# Patient Record
Sex: Male | Born: 1945
Health system: Southern US, Community
[De-identification: ages and names within clinical notes are randomized; demographics above are authoritative.]

## PROBLEM LIST (undated history)

## (undated) DIAGNOSIS — T7840XA Allergy, unspecified, initial encounter: Secondary | ICD-10-CM

## (undated) DIAGNOSIS — E785 Hyperlipidemia, unspecified: Secondary | ICD-10-CM

## (undated) DIAGNOSIS — K219 Gastro-esophageal reflux disease without esophagitis: Secondary | ICD-10-CM

## (undated) DIAGNOSIS — I10 Essential (primary) hypertension: Secondary | ICD-10-CM

## (undated) DIAGNOSIS — E119 Type 2 diabetes mellitus without complications: Secondary | ICD-10-CM

## (undated) HISTORY — PX: COLONOSCOPY: SHX174

## (undated) HISTORY — DX: Hyperlipidemia, unspecified: E78.5

## (undated) HISTORY — DX: Allergy, unspecified, initial encounter: T78.40XA

## (undated) HISTORY — DX: Type 2 diabetes mellitus without complications: E11.9

## (undated) HISTORY — PX: OTHER SURGICAL HISTORY: SHX169

---

## 2013-03-01 ENCOUNTER — Other Ambulatory Visit: Payer: Self-pay | Admitting: Physician Assistant

## 2013-05-02 ENCOUNTER — Other Ambulatory Visit: Payer: Self-pay | Admitting: Physician Assistant

## 2013-06-04 ENCOUNTER — Other Ambulatory Visit: Payer: Self-pay | Admitting: Physician Assistant

## 2013-06-05 NOTE — Telephone Encounter (Signed)
Last glucose level 02/2013   ACM

## 2013-07-06 ENCOUNTER — Other Ambulatory Visit: Payer: Self-pay

## 2013-07-06 MED ORDER — GLUCOSE BLOOD VI STRP: ORAL_STRIP | Status: DC

## 2013-07-06 NOTE — Telephone Encounter (Signed)
Last glucose 02/08/13   ACM 

## 2013-07-31 ENCOUNTER — Other Ambulatory Visit: Payer: Self-pay | Admitting: Family Medicine

## 2013-08-01 NOTE — Telephone Encounter (Signed)
Last glucose 02/08/13   ACM

## 2013-09-04 ENCOUNTER — Other Ambulatory Visit: Payer: Self-pay | Admitting: Family Medicine

## 2013-10-03 ENCOUNTER — Other Ambulatory Visit: Payer: Self-pay | Admitting: Family Medicine

## 2013-10-09 ENCOUNTER — Other Ambulatory Visit: Payer: Self-pay | Admitting: *Deleted

## 2013-10-09 ENCOUNTER — Other Ambulatory Visit: Payer: Self-pay

## 2013-10-09 MED ORDER — GLUCOSE BLOOD VI STRP: ORAL_STRIP | Status: DC

## 2013-10-09 MED ORDER — FENOFIBRATE 145 MG PO TABS: 145.0000 mg | ORAL_TABLET | Freq: Every day | ORAL | Status: DC

## 2013-10-24 ENCOUNTER — Encounter: Payer: Self-pay | Admitting: Family Medicine

## 2013-10-24 ENCOUNTER — Ambulatory Visit (INDEPENDENT_AMBULATORY_CARE_PROVIDER_SITE_OTHER): Payer: Medicare Other | Admitting: Family Medicine

## 2013-10-24 VITALS — BP 131/81 | HR 77 | Temp 99.5°F | Ht 68.0 in | Wt 195.0 lb

## 2013-10-24 DIAGNOSIS — I10 Essential (primary) hypertension: Secondary | ICD-10-CM

## 2013-10-24 DIAGNOSIS — E785 Hyperlipidemia, unspecified: Secondary | ICD-10-CM

## 2013-10-24 DIAGNOSIS — E119 Type 2 diabetes mellitus without complications: Secondary | ICD-10-CM | POA: Insufficient documentation

## 2013-10-24 LAB — POCT GLYCOSYLATED HEMOGLOBIN (HGB A1C): Hemoglobin A1C: 6.5

## 2013-10-24 LAB — POCT UA - MICROALBUMIN: Microalbumin Ur, POC: NEGATIVE mg/L

## 2013-10-24 MED ORDER — FENOFIBRATE 145 MG PO TABS: 145.0000 mg | ORAL_TABLET | Freq: Every day | ORAL | Status: DC

## 2013-10-24 MED ORDER — NIACIN ER (ANTIHYPERLIPIDEMIC) 1000 MG PO TBCR: 1000.0000 mg | EXTENDED_RELEASE_TABLET | Freq: Every day | ORAL | Status: DC

## 2013-10-24 MED ORDER — LISINOPRIL 5 MG PO TABS: 5.0000 mg | ORAL_TABLET | Freq: Every day | ORAL | Status: DC

## 2013-10-24 MED ORDER — CHOLECALCIFEROL 25 MCG (1000 UT) PO CAPS: 1000.0000 [IU] | ORAL_CAPSULE | Freq: Every day | ORAL | Status: DC

## 2013-10-24 MED ORDER — GLUCOSE BLOOD VI STRP: ORAL_STRIP | Status: DC

## 2013-10-24 MED ORDER — ATORVASTATIN CALCIUM 20 MG PO TABS: 20.0000 mg | ORAL_TABLET | Freq: Every day | ORAL | Status: DC

## 2013-10-24 NOTE — Progress Notes (Signed)
Patient name: Billy Nolan Medical record number: [id number] Date of birth: 1946-06-07 Age: 67 y.o. Gender: male  Primary Care Provider: Rudi Heap, MD  Chief Complaint: Chronic medical problem follow   History of Present Illness:  Diabetic ROS - medication compliance: compliant most of the time, home glucose monitoring: is performed regularly blood sugars in 130s-150s at home , acute symptoms are none.  New concerns: none .   Diabetic exam: fundi discs sharp, no papilledema, heart sounds normal rate, regular rhythm, normal S1, S2, no murmurs, rubs, clicks or gallops, chest clear, no carotid bruits, feet: warm, good capillary refill.  Lab review: no lab studies available for review at time of visit.  Assessment: Diabetes Mellitus: well controlled and stable.  Plan: See orders for this visit as documented in the electronic medical record. Diabetic issues reviewed with him: Continue current regimen.    Hypertension ROS: taking medications as instructed, no medication side effects noted, no TIA's, no chest pain on exertion, no dyspnea on exertion and no swelling of ankles.  New concerns: none .  Hypertension Exam: BP noted to be well controlled today in office, S1, S2 normal, no gallop, no murmur, chest clear, no JVD, no HSM, no edema.  Lab review: no lab studies available for review at time of visit.  Assessment:  Hypertension well controlled.  Plan: Current treatment plan is effective, no change in therapy.  Cardiovascular risk analysis - 67 y.o. male diabetic hypertension hyperlipidemia.  ROS: taking medications as instructed, no medication side effects noted, no TIA's, no chest pain on exertion, no dyspnea on exertion and no swelling of ankles.  New concerns: none .  Exam: BP noted to be well controlled today in office, S1, S2 normal, no gallop, no murmur, chest clear, no JVD, no HSM, no edema. Lab review: no lab studies available for review at time of visit.  Assessment:   Hyperlipidemia pending lipoprofile .  Plan: pending lipoprofile . Has been on statin and fibrate for extended period of time. Will che   Past Medical History: Patient Active Problem List   Diagnosis Date Noted  . DM (diabetes mellitus) 10/24/2013  . Hyperlipidemia 10/24/2013   Past Medical History  Diagnosis Date  . Diabetes mellitus without complication   . Hyperlipidemia     Past Surgical History: History reviewed. No pertinent past surgical history.  Social History: History   Social History  . Marital Status: Married    Spouse Name: N/A    Number of Children: N/A  . Years of Education: N/A   Social History Main Topics  . Smoking status: Never Smoker   . Smokeless tobacco: Never Used  . Alcohol Use: No  . Drug Use: No  . Sexual Activity: None   Other Topics Concern  . None   Social History Narrative  . None    Family History: Family History  Problem Relation Age of Onset  . Heart disease Mother   . Stroke Father   . Multiple sclerosis Sister     Allergies: Allergies  Allergen Reactions  . Sulfa Antibiotics Rash    Current Outpatient Prescriptions  Medication Sig Dispense Refill  . atorvastatin (LIPITOR) 20 MG tablet Take 20 mg by mouth daily.       . CELEBREX 200 MG capsule Take 200 mg by mouth daily.       . Cholecalciferol 1000 UNITS capsule Take 1,000 Units by mouth daily.      . fenofibrate (TRICOR) 145 MG tablet Take 1 tablet (145  mg total) by mouth daily.  15 tablet  0  . Glucosamine-Chondroit-Vit C-Mn (GLUCOSAMINE 1500 COMPLEX PO) Take 1 capsule by mouth.      Marland Kitchen glucose blood (FREESTYLE LITE) test strip USE TO CHECK GLUCOSE THREE TIMES DAILY Dx 250.00  100 each  0  . Magnesium 400 MG CAPS Take 1 capsule by mouth.      . metFORMIN (GLUCOPHAGE) 500 MG tablet Take 500 mg by mouth daily with breakfast.       . niacin (NIASPAN) 1000 MG CR tablet Take 1,000 mg by mouth at bedtime.        No current facility-administered medications for this  visit.   Review Of Systems: 12 point ROS negative except as noted above in HPI.  Physical Exam: Filed Vitals:   10/24/13 0900  BP: 131/81  Pulse: 77  Temp: 99.5 F (37.5 C)    General: alert and cooperative HEENT: PERRLA and extra ocular movement intact Heart: S1, S2 normal, no murmur, rub or gallop, regular rate and rhythm Lungs: clear to auscultation, no wheezes or rales and unlabored breathing Abdomen: abdomen is soft without significant tenderness, masses, organomegaly or guarding Extremities: extremities normal, atraumatic, no cyanosis or edema Skin:no rashes Neurology: normal without focal findings  Labs and Imaging: Pending   Assessment and Plan: DM (diabetes mellitus) - Plan: POCT glycosylated hemoglobin (Hb A1C), POCT UA - Microalbumin, glucose blood (FREESTYLE LITE) test strip, lisinopril (PRINIVIL,ZESTRIL) 5 MG tablet  Hyperlipidemia - Plan: NMR, lipoprofile, atorvastatin (LIPITOR) 20 MG tablet, fenofibrate (TRICOR) 145 MG tablet, niacin (NIASPAN) 1000 MG CR tablet  HTN (hypertension) - Plan: Comprehensive metabolic panel, lisinopril (PRINIVIL,ZESTRIL) 5 MG tablet   Sugars overall controlled at home, though may benefit from full dose metformin. Check A1C today.  Will need diabetic eye exam and ophtho exam.  Recheck lipoprofile today. Has been on statin-fibrate combo for extended period of time with no report of myopathy/muscle pain. Check CK to coorelate. Higher risk given age. Also Check Cr.  Will add on low dose lisinopril given baseline diabetic disease. Goal BP < 130/80 in setting of DM.  Continue ASA  Otherwise plan for follow up in 3-6 months.        Doree Albee MD

## 2013-10-24 NOTE — Patient Instructions (Addendum)
Return in 1 week for labwork (kidney function tests) Review handouts and consider Pneumonia and Influenza vaccine.  Type 2 Diabetes Mellitus, Adult Type 2 diabetes mellitus, often simply referred to as type 2 diabetes, is a long-lasting (chronic) disease. In type 2 diabetes, the pancreas does not make enough insulin (a hormone), the cells are less responsive to the insulin that is made (insulin resistance), or both. Normally, insulin moves sugars from food into the tissue cells. The tissue cells use the sugars for energy. The lack of insulin or the lack of normal response to insulin causes excess sugars to build up in the blood instead of going into the tissue cells. As a result, high blood sugar (hyperglycemia) develops. The effect of high sugar (glucose) levels can cause many complications. Type 2 diabetes was also previously called adult-onset diabetes but it can occur at any age.  RISK FACTORS  A person is predisposed to developing type 2 diabetes if someone in the family has the disease and also has one or more of the following primary risk factors:  Overweight.  An inactive lifestyle.  A history of consistently eating high-calorie foods. Maintaining a normal weight and regular physical activity can reduce the chance of developing type 2 diabetes. SYMPTOMS  A person with type 2 diabetes may not show symptoms initially. The symptoms of type 2 diabetes appear slowly. The symptoms include:  Increased thirst (polydipsia).  Increased urination (polyuria).  Increased urination during the night (nocturia).  Weight loss. This weight loss may be rapid.  Frequent, recurring infections.  Tiredness (fatigue).  Weakness.  Vision changes, such as blurred vision.  Fruity smell to your breath.  Abdominal pain.  Nausea or vomiting.  Cuts or bruises which are slow to heal.  Tingling or numbness in the hands or feet. DIAGNOSIS Type 2 diabetes is frequently not diagnosed until  complications of diabetes are present. Type 2 diabetes is diagnosed when symptoms or complications are present and when blood glucose levels are increased. Your blood glucose level may be checked by one or more of the following blood tests:  A fasting blood glucose test. You will not be allowed to eat for at least 8 hours before a blood sample is taken.  A random blood glucose test. Your blood glucose is checked at any time of the day regardless of when you ate.  A hemoglobin A1c blood glucose test. A hemoglobin A1c test provides information about blood glucose control over the previous 3 months.  An oral glucose tolerance test (OGTT). Your blood glucose is measured after you have not eaten (fasted) for 2 hours and then after you drink a glucose-containing beverage. TREATMENT   You may need to take insulin or diabetes medicine daily to keep blood glucose levels in the desired range.  You will need to match insulin dosing with exercise and healthy food choices. The treatment goal is to maintain the before meal blood sugar (preprandial glucose) level at 70 130 mg/dL. HOME CARE INSTRUCTIONS   Have your hemoglobin A1c level checked twice a year.  Perform daily blood glucose monitoring as directed by your caregiver.  Monitor urine ketones when you are ill and as directed by your caregiver.  Take your diabetes medicine or insulin as directed by your caregiver to maintain your blood glucose levels in the desired range.  Never run out of diabetes medicine or insulin. It is needed every day.  Adjust insulin based on your intake of carbohydrates. Carbohydrates can raise blood glucose levels but need  to be included in your diet. Carbohydrates provide vitamins, minerals, and fiber which are an essential part of a healthy diet. Carbohydrates are found in fruits, vegetables, whole grains, dairy products, legumes, and foods containing added sugars.    Eat healthy foods. Alternate 3 meals with 3  snacks.  Lose weight if overweight.  Carry a medical alert card or wear your medical alert jewelry.  Carry a 15 gram carbohydrate snack with you at all times to treat low blood glucose (hypoglycemia). Some examples of 15 gram carbohydrate snacks include:  Glucose tablets, 3 or 4   Glucose gel, 15 gram tube  Raisins, 2 tablespoons (24 grams)  Jelly beans, 6  Animal crackers, 8  Regular pop, 4 ounces (120 mL)  Gummy treats, 9  Recognize hypoglycemia. Hypoglycemia occurs with blood glucose levels of 70 mg/dL and below. The risk for hypoglycemia increases when fasting or skipping meals, during or after intense exercise, and during sleep. Hypoglycemia symptoms can include:  Tremors or shakes.  Decreased ability to concentrate.  Sweating.  Increased heart rate.  Headache.  Dry mouth.  Hunger.  Irritability.  Anxiety.  Restless sleep.  Altered speech or coordination.  Confusion.  Treat hypoglycemia promptly. If you are alert and able to safely swallow, follow the 15:15 rule:  Take 15 20 grams of rapid-acting glucose or carbohydrate. Rapid-acting options include glucose gel, glucose tablets, or 4 ounces (120 mL) of fruit juice, regular soda, or low fat milk.  Check your blood glucose level 15 minutes after taking the glucose.  Take 15 20 grams more of glucose if the repeat blood glucose level is still 70 mg/dL or below.  Eat a meal or snack within 1 hour once blood glucose levels return to normal.    Be alert to polyuria and polydipsia which are early signs of hyperglycemia. An early awareness of hyperglycemia allows for prompt treatment. Treat hyperglycemia as directed by your caregiver.  Engage in at least 150 minutes of moderate-intensity physical activity a week, spread over at least 3 days of the week or as directed by your caregiver. In addition, you should engage in resistance exercise at least 2 times a week or as directed by your caregiver.  Adjust  your medicine and food intake as needed if you start a new exercise or sport.  Follow your sick day plan at any time you are unable to eat or drink as usual.  Avoid tobacco use.  Limit alcohol intake to no more than 1 drink per day for nonpregnant women and 2 drinks per day for men. You should drink alcohol only when you are also eating food. Talk with your caregiver whether alcohol is safe for you. Tell your caregiver if you drink alcohol several times a week.  Follow up with your caregiver regularly.  Schedule an eye exam soon after the diagnosis of type 2 diabetes and then annually.  Perform daily skin and foot care. Examine your skin and feet daily for cuts, bruises, redness, nail problems, bleeding, blisters, or sores. A foot exam by a caregiver should be done annually.  Brush your teeth and gums at least twice a day and floss at least once a day. Follow up with your dentist regularly.  Share your diabetes management plan with your workplace or school.  Stay up-to-date with immunizations.  Learn to manage stress.  Obtain ongoing diabetes education and support as needed.  Participate in, or seek rehabilitation as needed to maintain or improve independence and quality of life. Request  a physical or occupational therapy referral if you are having foot or hand numbness or difficulties with grooming, dressing, eating, or physical activity. SEEK MEDICAL CARE IF:   You are unable to eat food or drink fluids for more than 6 hours.  You have nausea and vomiting for more than 6 hours.  Your blood glucose level is over 240 mg/dL.  There is a change in mental status.  You develop an additional serious illness.  You have diarrhea for more than 6 hours.  You have been sick or have had a fever for a couple of days and are not getting better.  You have pain during any physical activity.  SEEK IMMEDIATE MEDICAL CARE IF:  You have difficulty breathing.  You have moderate to large  ketone levels. MAKE SURE YOU:  Understand these instructions.  Will watch your condition.  Will get help right away if you are not doing well or get worse. Document Released: 11/22/2005 Document Revised: 08/16/2012 Document Reviewed: 06/20/2012 [facility] Patient Information 2014 [place], [place].

## 2013-10-26 LAB — COMPREHENSIVE METABOLIC PANEL
ALT: 23 IU/L (ref 0–44)
AST: 18 IU/L (ref 0–40)
BUN: 11 mg/dL (ref 8–27)
CO2: 23 mmol/L (ref 18–29)
Calcium: 9.8 mg/dL (ref 8.6–10.2)
Chloride: 103 mmol/L (ref 97–108)
Creatinine, Ser: 0.95 mg/dL (ref 0.76–1.27)
Globulin, Total: 1.9 g/dL (ref 1.5–4.5)
Glucose: 133 mg/dL — ABNORMAL HIGH (ref 65–99)
Potassium: 4.9 mmol/L (ref 3.5–5.2)
Sodium: 141 mmol/L (ref 134–144)
Total Protein: 6.5 g/dL (ref 6.0–8.5)

## 2013-10-26 LAB — CK TOTAL AND CKMB (NOT AT ARMC)
CK-MB Index: 2.4 ng/mL (ref 0.0–5.0)
Total CK: 87 U/L (ref 24–204)

## 2013-10-26 LAB — NMR, LIPOPROFILE
HDL Particle Number: 32.5 umol/L (ref >=30.5)
LDL Size: 20 nm — ABNORMAL LOW (ref >20.5)
Small LDL Particle Number: 1194 nmol/L — ABNORMAL HIGH (ref <=527)
Triglycerides by NMR: 125 mg/dL (ref <150)

## 2013-11-14 ENCOUNTER — Other Ambulatory Visit: Payer: Self-pay | Admitting: Family Medicine

## 2013-11-14 DIAGNOSIS — E785 Hyperlipidemia, unspecified: Secondary | ICD-10-CM

## 2013-11-14 MED ORDER — ROSUVASTATIN CALCIUM 40 MG PO TABS: 20.0000 mg | ORAL_TABLET | Freq: Every day | ORAL | Status: DC

## 2013-11-19 ENCOUNTER — Telehealth: Payer: Self-pay | Admitting: *Deleted

## 2013-11-19 NOTE — Telephone Encounter (Signed)
Released to My Chart. Medication was ordered in Epic.

## 2013-11-19 NOTE — Telephone Encounter (Signed)
Message copied by Gwenith Daily on Mon Nov 19, 2013  3:07 PM ------      Message from: Floydene Flock      Created: Wed Nov 14, 2013  9:14 AM       Transition pt to crestor 20 to help with HLD       Will stop lipitor. ------

## 2013-11-22 ENCOUNTER — Other Ambulatory Visit: Payer: Self-pay | Admitting: Family Medicine

## 2013-11-22 DIAGNOSIS — E119 Type 2 diabetes mellitus without complications: Secondary | ICD-10-CM

## 2013-11-24 ENCOUNTER — Encounter: Payer: Self-pay | Admitting: Family Medicine

## 2013-11-26 ENCOUNTER — Other Ambulatory Visit: Payer: Self-pay

## 2013-11-26 ENCOUNTER — Other Ambulatory Visit: Payer: Self-pay | Admitting: Nurse Practitioner

## 2013-11-26 DIAGNOSIS — E785 Hyperlipidemia, unspecified: Secondary | ICD-10-CM

## 2013-11-26 MED ORDER — FENOFIBRATE 145 MG PO TABS: 145.0000 mg | ORAL_TABLET | Freq: Every day | ORAL | Status: DC

## 2013-11-27 MED ORDER — GLUCOSE BLOOD VI STRP: ORAL_STRIP | Status: DC

## 2013-12-02 ENCOUNTER — Encounter: Payer: Self-pay | Admitting: Family Medicine

## 2013-12-03 ENCOUNTER — Other Ambulatory Visit: Payer: Self-pay | Admitting: Family Medicine

## 2013-12-03 MED ORDER — CELECOXIB 200 MG PO CAPS: 200.0000 mg | ORAL_CAPSULE | Freq: Every day | ORAL | Status: DC

## 2013-12-03 MED ORDER — METFORMIN HCL 500 MG PO TABS: 500.0000 mg | ORAL_TABLET | Freq: Every day | ORAL | Status: DC

## 2014-03-04 ENCOUNTER — Other Ambulatory Visit: Payer: Self-pay | Admitting: *Deleted

## 2014-03-04 MED ORDER — CELECOXIB 200 MG PO CAPS: 200.0000 mg | ORAL_CAPSULE | Freq: Every day | ORAL | Status: DC

## 2014-03-04 NOTE — Telephone Encounter (Signed)
Patient only seen once on 10-24-13 by [facility]. Please advise

## 2014-05-22 ENCOUNTER — Other Ambulatory Visit: Payer: Self-pay | Admitting: Family Medicine

## 2014-05-29 ENCOUNTER — Other Ambulatory Visit: Payer: Self-pay | Admitting: Family Medicine

## 2014-05-30 NOTE — Telephone Encounter (Signed)
This is okay to refill x1. Patient should have a BMP and LFTs and CBC if on this long term--please let patient know this

## 2014-05-30 NOTE — Telephone Encounter (Signed)
Only seen in epic 1 time in 11-14. Patient of [place]. Please advise on refill

## 2014-06-20 ENCOUNTER — Other Ambulatory Visit: Payer: Self-pay | Admitting: Family Medicine

## 2014-06-25 ENCOUNTER — Other Ambulatory Visit: Payer: Self-pay | Admitting: Family Medicine

## 2014-07-02 ENCOUNTER — Other Ambulatory Visit: Payer: Self-pay | Admitting: *Deleted

## 2014-07-02 MED ORDER — CELECOXIB 200 MG PO CAPS: ORAL_CAPSULE | ORAL | Status: DC

## 2014-07-02 NOTE — Telephone Encounter (Signed)
Is okay to refill. Patient must have a CBC LFTs and BMP because of taking this medication for such a long period of time without being seen

## 2014-07-03 ENCOUNTER — Other Ambulatory Visit: Payer: Self-pay | Admitting: *Deleted

## 2014-07-03 ENCOUNTER — Ambulatory Visit (INDEPENDENT_AMBULATORY_CARE_PROVIDER_SITE_OTHER): Payer: Medicare Other | Admitting: Family Medicine

## 2014-07-03 ENCOUNTER — Encounter: Payer: Self-pay | Admitting: Family Medicine

## 2014-07-03 VITALS — BP 151/84 | HR 90 | Temp 98.5°F | Ht 68.0 in | Wt 193.0 lb

## 2014-07-03 DIAGNOSIS — E785 Hyperlipidemia, unspecified: Secondary | ICD-10-CM

## 2014-07-03 DIAGNOSIS — Z23 Encounter for immunization: Secondary | ICD-10-CM

## 2014-07-03 DIAGNOSIS — IMO0001 Reserved for inherently not codable concepts without codable children: Secondary | ICD-10-CM

## 2014-07-03 DIAGNOSIS — E1165 Type 2 diabetes mellitus with hyperglycemia: Secondary | ICD-10-CM

## 2014-07-03 DIAGNOSIS — Z1211 Encounter for screening for malignant neoplasm of colon: Secondary | ICD-10-CM

## 2014-07-03 LAB — POCT UA - MICROALBUMIN: Microalbumin Ur, POC: 50 mg/L

## 2014-07-03 LAB — POCT GLYCOSYLATED HEMOGLOBIN (HGB A1C): Hemoglobin A1C: 7.4

## 2014-07-03 MED ORDER — FENOFIBRATE 145 MG PO TABS: 145.0000 mg | ORAL_TABLET | Freq: Every day | ORAL | Status: DC

## 2014-07-03 MED ORDER — CELECOXIB 200 MG PO CAPS: ORAL_CAPSULE | ORAL | Status: DC

## 2014-07-03 MED ORDER — LISINOPRIL 5 MG PO TABS: 5.0000 mg | ORAL_TABLET | Freq: Every day | ORAL | Status: DC

## 2014-07-03 MED ORDER — GLUCOSE BLOOD VI STRP: 1.0000 | ORAL_STRIP | Freq: Two times a day (BID) | Status: DC

## 2014-07-03 MED ORDER — NIACIN ER (ANTIHYPERLIPIDEMIC) 1000 MG PO TBCR: 1000.0000 mg | EXTENDED_RELEASE_TABLET | Freq: Every day | ORAL | Status: DC

## 2014-07-03 NOTE — Addendum Note (Signed)
Addended by: Lyn Henri C on: 07/03/2014 12:05 PM   Modules accepted: Orders

## 2014-07-03 NOTE — Patient Instructions (Signed)

## 2014-07-03 NOTE — Progress Notes (Signed)
   Subjective:    Patient ID: Billy Nolan, male    DOB: Feb 23, 1946, 68 y.o.   MRN: [id number]  HPI 68 year old gentleman here to followup diabetes, hyperlipidemia. He checks his sugars some home generally been around 1:30 to 150 range fasting. He does complain of some backache for which he takes Celebrex. We discussed possible complications with the use of Cox 2 inhibitors and Celebrex but he has not had any problems and there's no history of heart disease so we will continue.    Review of Systems  Constitutional: Negative.   HENT: Negative.   Eyes: Negative.   Respiratory: Negative.   Cardiovascular: Negative.   Endocrine: Negative.   Genitourinary: Negative.   Musculoskeletal: Negative.        Objective:   Physical Exam  Constitutional: He is oriented to person, place, and time. He appears well-developed and well-nourished.  HENT:  Head: Normocephalic.  Right Ear: External ear normal.  Left Ear: External ear normal.  Nose: Nose normal.  Mouth/Throat: Oropharynx is clear and moist.  Eyes: Conjunctivae and EOM are normal. Pupils are equal, round, and reactive to light.  Neck: Normal range of motion. Neck supple.  Cardiovascular: Normal rate, regular rhythm, normal heart sounds and intact distal pulses.   Pulmonary/Chest: Effort normal and breath sounds normal.  Abdominal: Soft. Bowel sounds are normal.  Musculoskeletal: Normal range of motion.  Neurological: He is alert and oriented to person, place, and time.  Skin: Skin is warm and dry.  Psychiatric: He has a normal mood and affect. His behavior is normal. Judgment and thought content normal.          Assessment & Plan:  The primary encounter diagnosis was Encounter for screening colonoscopy. Diagnoses of Hyperlipidemia and Type II or unspecified type diabetes mellitus without mention of complication, uncontrolled were also pertinent to this visit. Will check labs:A1C, lipids, LFT's  Wardell Honour MD

## 2014-07-04 LAB — CMP14+EGFR
ALK PHOS: 49 IU/L (ref 39–117)
ALT: 25 IU/L (ref 0–44)
AST: 17 IU/L (ref 0–40)
Albumin/Globulin Ratio: 2.4 (ref 1.1–2.5)
Albumin: 4.7 g/dL (ref 3.6–4.8)
BUN / CREAT RATIO: 20 (ref 10–22)
BUN: 20 mg/dL (ref 8–27)
CHLORIDE: 104 mmol/L (ref 97–108)
CO2: 24 mmol/L (ref 18–29)
Calcium: 10.1 mg/dL (ref 8.6–10.2)
Creatinine, Ser: 0.99 mg/dL (ref 0.76–1.27)
GFR calc Af Amer: 91 mL/min/{1.73_m2} (ref >59)
GFR calc non Af Amer: 79 mL/min/{1.73_m2} (ref >59)
GLOBULIN, TOTAL: 2 g/dL (ref 1.5–4.5)
Glucose: 143 mg/dL — ABNORMAL HIGH (ref 65–99)
Potassium: 5.1 mmol/L (ref 3.5–5.2)
Sodium: 142 mmol/L (ref 134–144)
TOTAL PROTEIN: 6.7 g/dL (ref 6.0–8.5)
Total Bilirubin: 0.9 mg/dL (ref 0.0–1.2)

## 2014-07-04 LAB — LIPID PANEL
Chol/HDL Ratio: 4 ratio units (ref 0.0–5.0)
Cholesterol, Total: 135 mg/dL (ref 100–199)
HDL: 34 mg/dL — ABNORMAL LOW (ref >39)
LDL CALC: 66 mg/dL (ref 0–99)
Triglycerides: 175 mg/dL — ABNORMAL HIGH (ref 0–149)
VLDL CHOLESTEROL CAL: 35 mg/dL (ref 5–40)

## 2014-07-04 LAB — MICROALBUMIN, URINE

## 2014-07-17 ENCOUNTER — Encounter: Payer: Self-pay | Admitting: Internal Medicine

## 2014-07-24 ENCOUNTER — Other Ambulatory Visit: Payer: Self-pay | Admitting: *Deleted

## 2014-07-24 MED ORDER — LISINOPRIL 5 MG PO TABS: 5.0000 mg | ORAL_TABLET | Freq: Every day | ORAL | Status: DC

## 2014-08-28 ENCOUNTER — Ambulatory Visit (AMBULATORY_SURGERY_CENTER): Payer: Medicare Other

## 2014-08-28 VITALS — Ht 68.0 in | Wt 195.2 lb

## 2014-08-28 DIAGNOSIS — Z1211 Encounter for screening for malignant neoplasm of colon: Secondary | ICD-10-CM

## 2014-08-28 MED ORDER — MOVIPREP 100 G PO SOLR: 1.0000 | Freq: Once | ORAL | Status: DC

## 2014-08-28 NOTE — Progress Notes (Signed)
No allergies to eggs or soy No past exposure to anesthesia except IV conscious sedation for colonoscopy No home oxygen No diet/weight loss meds  Has email  Emmi instructions given for colonoscopy

## 2014-09-09 ENCOUNTER — Encounter: Payer: Self-pay | Admitting: Internal Medicine

## 2014-09-12 ENCOUNTER — Encounter: Payer: Self-pay | Admitting: Internal Medicine

## 2014-09-12 ENCOUNTER — Ambulatory Visit (AMBULATORY_SURGERY_CENTER): Payer: Medicare Other | Admitting: Internal Medicine

## 2014-09-12 VITALS — BP 124/78 | HR 67 | Temp 97.2°F | Resp 16 | Ht 68.0 in | Wt 195.0 lb

## 2014-09-12 DIAGNOSIS — D128 Benign neoplasm of rectum: Secondary | ICD-10-CM

## 2014-09-12 DIAGNOSIS — D123 Benign neoplasm of transverse colon: Secondary | ICD-10-CM

## 2014-09-12 DIAGNOSIS — Z1211 Encounter for screening for malignant neoplasm of colon: Secondary | ICD-10-CM

## 2014-09-12 DIAGNOSIS — K635 Polyp of colon: Secondary | ICD-10-CM

## 2014-09-12 MED ORDER — SODIUM CHLORIDE 0.9 % IV SOLN: 500.0000 mL | INTRAVENOUS | Status: DC

## 2014-09-12 NOTE — Op Note (Signed)
[facility]  [address] [place], [id number]   COLONOSCOPY PROCEDURE REPORT  PATIENT: Billy Nolan, Billy Nolan  MR#: [id number] BIRTHDATE: 04-09-1946 , 68  yrs. old GENDER: male ENDOSCOPIST: Eustace Quail, MD REFERRED EL:[id number] Laurance Flatten, M.D. PROCEDURE DATE:  09/12/2014 PROCEDURE:   Colonoscopy with snare polypectomy x 1 First Screening Colonoscopy - Avg.  risk and is 50 yrs.  old or older Yes.  Prior Negative Screening - Now for repeat screening. N/A  History of Adenoma - Now for follow-up colonoscopy & has been > or = to 3 yrs.  N/A  Polyps Removed Today? Yes. ASA CLASS:   Class II INDICATIONS:average risk for colorectal cancer. MEDICATIONS: Monitored anesthesia care and Propofol 200 mg IV  DESCRIPTION OF PROCEDURE:   After the risks benefits and alternatives of the procedure were thoroughly explained, informed consent was obtained.  The digital rectal exam revealed no abnormalities of the rectum.   The [id number]  endoscope was introduced through the anus and advanced to the cecum, which was identified by both the appendix and ileocecal valve. No adverse events experienced.   The quality of the prep was excellent, using MoviPrep  The instrument was then slowly withdrawn as the colon was fully examined.    COLON FINDINGS: A single polyp measuring 3 mm in size was found in the transverse colon.  A polypectomy was performed with a cold snare.  The resection was complete, the polyp tissue was completely retrieved and sent to histology.   There was moderate diverticulosis noted in the left colon.   The examination was otherwise normal.  Retroflexed views revealed internal hemorrhoids. The time to cecum=2 minutes 56 seconds.  Withdrawal time=10 minutes 24 seconds.  The scope was withdrawn and the procedure completed.  COMPLICATIONS: There were no immediate complications.  ENDOSCOPIC IMPRESSION: 1.   Single polyp measuring 3 mm in size was found in the  transverse colon; polypectomy was performed with a cold snare 2.   Moderate diverticulosis was noted in the left colon 3.   The examination was otherwise normal  RECOMMENDATIONS: 1. Repeat colonoscopy in 5 years if polyp adenomatous; otherwise 10 years  eSigned:  Eustace Quail, MD 09/12/2014 12:31 PM   cc: Redge Gainer, MD and The Patient

## 2014-09-12 NOTE — Progress Notes (Signed)
A/ox3 pleased with MAC, report to Sheila RN 

## 2014-09-12 NOTE — Patient Instructions (Signed)
YOU HAD AN ENDOSCOPIC PROCEDURE TODAY AT THE [facility]: Refer to the procedure report that was given to you for any specific questions about what was found during the examination.  If the procedure report does not answer your questions, please call your gastroenterologist to clarify.  If you requested that your care partner not be given the details of your procedure findings, then the procedure report has been included in a sealed envelope for you to review at your convenience later.  YOU SHOULD EXPECT: Some feelings of bloating in the abdomen. Passage of more gas than usual.  Walking can help get rid of the air that was put into your GI tract during the procedure and reduce the bloating. If you had a lower endoscopy (such as a colonoscopy or flexible sigmoidoscopy) you may notice spotting of blood in your stool or on the toilet paper. If you underwent a bowel prep for your procedure, then you may not have a normal bowel movement for a few days.  DIET: Your first meal following the procedure should be a light meal and then it is ok to progress to your normal diet.  A half-sandwich or bowl of soup is an example of a good first meal.  Heavy or fried foods are harder to digest and may make you feel nauseous or bloated.  Likewise meals heavy in dairy and vegetables can cause extra gas to form and this can also increase the bloating.  Drink plenty of fluids but you should avoid alcoholic beverages for 24 hours.  ACTIVITY: Your care partner should take you home directly after the procedure.  You should plan to take it easy, moving slowly for the rest of the day.  You can resume normal activity the day after the procedure however you should NOT DRIVE or use heavy machinery for 24 hours (because of the sedation medicines used during the test).    SYMPTOMS TO REPORT IMMEDIATELY: A gastroenterologist can be reached at any hour.  During normal business hours, 8:30 AM to 5:00 PM Monday through Friday,  call [phone].  After hours and on weekends, please call the GI answering service at [phone] who will take a message and have the physician on call contact you.   Following lower endoscopy (colonoscopy or flexible sigmoidoscopy):  Excessive amounts of blood in the stool  Significant tenderness or worsening of abdominal pains  Swelling of the abdomen that is new, acute  Fever of 100F or higher   FOLLOW UP: If any biopsies were taken you will be contacted by phone or by letter within the next 1-3 weeks.  Call your gastroenterologist if you have not heard about the biopsies in 3 weeks.  Our staff will call the home number listed on your records the next business day following your procedure to check on you and address any questions or concerns that you may have at that time regarding the information given to you following your procedure. This is a courtesy call and so if there is no answer at the home number and we have not heard from you through the emergency physician on call, we will assume that you have returned to your regular daily activities without incident.  SIGNATURES/CONFIDENTIALITY: You and/or your care partner have signed paperwork which will be entered into your electronic medical record.  These signatures attest to the fact that that the information above on your After Visit Summary has been reviewed and is understood.  Full responsibility of the confidentiality of   this discharge information lies with you and/or your care-partner.   Resume medications. Information given on polyps,diverticulosis and high fiber diet with discharge instructions. 

## 2014-09-13 ENCOUNTER — Telehealth: Payer: Self-pay

## 2014-09-13 NOTE — Telephone Encounter (Signed)
  Follow up Call-  Call back number 09/12/2014  Post procedure Call Back phone  # [phone]  Permission to leave phone message Yes     Patient questions:  Do you have a fever, pain , or abdominal swelling? No. Pain Score  0 *  Have you tolerated food without any problems? Yes.    Have you been able to return to your normal activities? Yes.    Do you have any questions about your discharge instructions: Diet   No. Medications  No. Follow up visit  No.  Do you have questions or concerns about your Care? No.  Actions: * If pain score is 4 or above: No action needed, pain <4.

## 2014-09-18 ENCOUNTER — Encounter: Payer: Self-pay | Admitting: Internal Medicine

## 2014-10-04 ENCOUNTER — Ambulatory Visit (INDEPENDENT_AMBULATORY_CARE_PROVIDER_SITE_OTHER): Payer: Medicare Other | Admitting: Family Medicine

## 2014-10-04 ENCOUNTER — Encounter: Payer: Self-pay | Admitting: Family Medicine

## 2014-10-04 VITALS — BP 132/82 | HR 90 | Temp 98.0°F | Ht 68.0 in | Wt 193.0 lb

## 2014-10-04 DIAGNOSIS — E119 Type 2 diabetes mellitus without complications: Secondary | ICD-10-CM

## 2014-10-04 DIAGNOSIS — N400 Enlarged prostate without lower urinary tract symptoms: Secondary | ICD-10-CM

## 2014-10-04 LAB — POCT GLYCOSYLATED HEMOGLOBIN (HGB A1C): Hemoglobin A1C: 7.8

## 2014-10-04 MED ORDER — SILDENAFIL CITRATE 20 MG PO TABS: ORAL_TABLET | ORAL | Status: DC

## 2014-10-04 NOTE — Progress Notes (Signed)
   Subjective:    Patient ID: Billy Nolan, male    DOB: 09-04-1946, 68 y.o.   MRN: [id number]  HPI 68 year old gentleman here for follow-up diabetes and hyperlipidemia. He is only taking 500 of metformin once a day. He admits to some elevated glucose readings at home and his last A1c 3 months ago was 7.4, not quite at goal. He also asked about a prostate exam, having not had one in recent memory. There is no family history. He also requests prescription for erectile dysfunction    Review of Systems  Constitutional: Negative.   HENT: Negative.   Eyes: Negative.   Respiratory: Negative.  Negative for shortness of breath.   Cardiovascular: Negative.  Negative for chest pain and leg swelling.  Gastrointestinal: Negative.   Genitourinary: Negative.   Musculoskeletal: Negative.   Skin: Negative.   Neurological: Negative.   Psychiatric/Behavioral: Negative.   All other systems reviewed and are negative.      Objective:   Physical Exam  Constitutional: He is oriented to person, place, and time. He appears well-developed and well-nourished.  HENT:  Head: Normocephalic.  Right Ear: External ear normal.  Left Ear: External ear normal.  Nose: Nose normal.  Mouth/Throat: Oropharynx is clear and moist.  Eyes: Conjunctivae and EOM are normal. Pupils are equal, round, and reactive to light.  Neck: Normal range of motion. Neck supple.  Cardiovascular: Normal rate, regular rhythm, normal heart sounds and intact distal pulses.   Pulmonary/Chest: Effort normal and breath sounds normal.  Abdominal: Soft. Bowel sounds are normal.  Genitourinary: Prostate normal.  Musculoskeletal: Normal range of motion.  Neurological: He is alert and oriented to person, place, and time.  Skin: Skin is warm and dry.  Psychiatric: He has a normal mood and affect. His behavior is normal. Judgment and thought content normal.     BP 132/82  Pulse 90  Temp(Src) 98 F (36.7 C) (Oral)  Ht 5\' 8"  (1.727 m)  Wt  193 lb (87.544 kg)  BMI 29.35 kg/m2     Assessment & Plan:  1. Type 2 diabetes mellitus without complication Increase metformin to 500 mg twice a day - POCT glycosylated hemoglobin (Hb A1C)  2. BPH (benign prostatic hyperplasia) Normal prostate exam  Wardell Honour MD - PSA, total and free

## 2014-10-05 LAB — PSA, TOTAL AND FREE
PSA, Free Pct: 40 %
PSA, Free: 0.16 ng/mL
PSA: 0.4 ng/mL (ref 0.0–4.0)

## 2014-10-24 ENCOUNTER — Telehealth: Payer: Self-pay | Admitting: Family Medicine

## 2014-10-24 NOTE — Telephone Encounter (Signed)
Spoke to Butch Penny pt's wife, she states at last appt he was told to take Metformin twice a day so they need a new rx sent in, couldn't find any documentation regarding a change in the Metformin, please advise.

## 2014-10-25 MED ORDER — LISINOPRIL 5 MG PO TABS: 5.0000 mg | ORAL_TABLET | Freq: Every day | ORAL | Status: DC

## 2014-10-25 MED ORDER — METFORMIN HCL 500 MG PO TABS: 500.0000 mg | ORAL_TABLET | Freq: Every day | ORAL | Status: DC

## 2014-12-26 ENCOUNTER — Telehealth: Payer: Self-pay | Admitting: Family Medicine

## 2014-12-28 ENCOUNTER — Other Ambulatory Visit: Payer: Self-pay | Admitting: *Deleted

## 2014-12-28 DIAGNOSIS — E785 Hyperlipidemia, unspecified: Secondary | ICD-10-CM

## 2014-12-28 MED ORDER — CELECOXIB 200 MG PO CAPS: ORAL_CAPSULE | ORAL | Status: DC

## 2014-12-28 MED ORDER — ROSUVASTATIN CALCIUM 40 MG PO TABS: 20.0000 mg | ORAL_TABLET | Freq: Every day | ORAL | Status: DC

## 2014-12-28 NOTE — Telephone Encounter (Signed)
I do not see a request.

## 2015-03-28 ENCOUNTER — Other Ambulatory Visit: Payer: Self-pay

## 2015-03-28 NOTE — Telephone Encounter (Signed)
Last seen 10/04/14 Dr Sabra Heck if approved route to nurse to call into [facility]

## 2015-03-31 MED ORDER — SILDENAFIL CITRATE 20 MG PO TABS: ORAL_TABLET | ORAL | Status: DC

## 2015-03-31 NOTE — Telephone Encounter (Signed)
Refill left on voicemail at [facility]

## 2015-05-24 ENCOUNTER — Other Ambulatory Visit: Payer: Self-pay | Admitting: Family Medicine

## 2015-05-26 NOTE — Telephone Encounter (Signed)
Last seen 10/04/14 Dr Sabra Heck  If approved route to nurse to call into [facility]

## 2015-05-26 NOTE — Telephone Encounter (Signed)
Refill called to [facility] 

## 2015-06-23 ENCOUNTER — Other Ambulatory Visit: Payer: Self-pay | Admitting: Family Medicine

## 2015-07-16 ENCOUNTER — Other Ambulatory Visit: Payer: Self-pay | Admitting: Family Medicine

## 2015-07-20 ENCOUNTER — Other Ambulatory Visit: Payer: Self-pay | Admitting: Family Medicine

## 2015-07-21 NOTE — Telephone Encounter (Signed)
Patient last seen in office on 10-04-14. Please advise on refill

## 2015-09-25 ENCOUNTER — Other Ambulatory Visit: Payer: Self-pay | Admitting: Family Medicine

## 2015-09-25 NOTE — Telephone Encounter (Signed)
LM that he will need appt before refills after this #30 can be renewed

## 2015-09-25 NOTE — Telephone Encounter (Signed)
Last seen by Sabra Heck 09/2014

## 2015-10-21 ENCOUNTER — Encounter: Payer: Self-pay | Admitting: Family Medicine

## 2015-10-21 ENCOUNTER — Ambulatory Visit (INDEPENDENT_AMBULATORY_CARE_PROVIDER_SITE_OTHER): Payer: Medicare Other | Admitting: Family Medicine

## 2015-10-21 VITALS — BP 118/78 | HR 94 | Temp 97.1°F | Ht 68.0 in | Wt 200.7 lb

## 2015-10-21 DIAGNOSIS — E119 Type 2 diabetes mellitus without complications: Secondary | ICD-10-CM

## 2015-10-21 DIAGNOSIS — N400 Enlarged prostate without lower urinary tract symptoms: Secondary | ICD-10-CM | POA: Diagnosis not present

## 2015-10-21 DIAGNOSIS — I159 Secondary hypertension, unspecified: Secondary | ICD-10-CM

## 2015-10-21 DIAGNOSIS — E785 Hyperlipidemia, unspecified: Secondary | ICD-10-CM | POA: Diagnosis not present

## 2015-10-21 LAB — POCT GLYCOSYLATED HEMOGLOBIN (HGB A1C): HEMOGLOBIN A1C: 9.1

## 2015-10-21 MED ORDER — FENOFIBRATE 145 MG PO TABS: 145.0000 mg | ORAL_TABLET | Freq: Every day | ORAL | Status: DC

## 2015-10-21 MED ORDER — NIACIN ER (ANTIHYPERLIPIDEMIC) 1000 MG PO TBCR: 1000.0000 mg | EXTENDED_RELEASE_TABLET | Freq: Every day | ORAL | Status: DC

## 2015-10-21 MED ORDER — ROSUVASTATIN CALCIUM 40 MG PO TABS: 20.0000 mg | ORAL_TABLET | Freq: Every day | ORAL | Status: DC

## 2015-10-21 MED ORDER — SILDENAFIL CITRATE 20 MG PO TABS: ORAL_TABLET | ORAL | Status: DC

## 2015-10-21 MED ORDER — METFORMIN HCL 500 MG PO TABS: 500.0000 mg | ORAL_TABLET | Freq: Two times a day (BID) | ORAL | Status: DC

## 2015-10-21 MED ORDER — LOSARTAN POTASSIUM 25 MG PO TABS: 25.0000 mg | ORAL_TABLET | Freq: Every day | ORAL | Status: DC

## 2015-10-21 NOTE — Addendum Note (Signed)
Addended by: Wardell Heath on: 10/21/2015 11:10 AM   Modules accepted: Orders

## 2015-10-21 NOTE — Progress Notes (Signed)
   Subjective:    Patient ID: Billy Nolan, male    DOB: October 04, 1946, 69 y.o.   MRN: [id number]  HPI  Patient here today for 6 month follow up on hypertension, hypelipidemia and diabetes. Still taking metformin once a day. Also having some dry cough with lisinopril. Need to update all labs since it has been a year since last checked.    Review of Systems  Constitutional: Negative.   HENT: Negative.   Eyes: Negative.   Respiratory: Negative.   Cardiovascular: Negative.   Gastrointestinal: Negative.   Endocrine: Negative.   Genitourinary: Negative.   Musculoskeletal: Negative.   Skin: Negative.   Allergic/Immunologic: Negative.   Neurological: Negative.   Hematological: Negative.   Psychiatric/Behavioral: Negative.    Patient Active Problem List   Diagnosis Date Noted  . DM (diabetes mellitus) ([place]) 10/24/2013  . Hyperlipidemia 10/24/2013   Outpatient Encounter Prescriptions as of 10/21/2015  Medication Sig  . aspirin 81 MG tablet Take 81 mg by mouth daily.  . Cholecalciferol 1000 UNITS capsule Take 1 capsule (1,000 Units total) by mouth daily.  . fenofibrate (TRICOR) 145 MG tablet TAKE ONE TABLET BY MOUTH ONCE DAILY  . Glucosamine-Chondroit-Vit C-Mn (GLUCOSAMINE 1500 COMPLEX PO) Take 1 capsule by mouth.  Marland Kitchen glucose blood (FREESTYLE LITE) test strip 1 each by Other route 2 (two) times daily.  Marland Kitchen lisinopril (PRINIVIL,ZESTRIL) 5 MG tablet TAKE ONE TABLET BY MOUTH ONCE DAILY  . Magnesium 400 MG CAPS Take 1 capsule by mouth.  . metFORMIN (GLUCOPHAGE) 500 MG tablet Take 1 tablet (500 mg total) by mouth daily with breakfast.  . niacin (NIASPAN) 1000 MG CR tablet TAKE ONE TABLET BY MOUTH ONCE DAILY AT BEDTIME  . rosuvastatin (CRESTOR) 40 MG tablet Take 0.5 tablets (20 mg total) by mouth daily.  . sildenafil (REVATIO) 20 MG tablet TAKE ONE TO TWO TABLETS BY MOUTH AS NEEDED  . celecoxib (CELEBREX) 200 MG capsule TAKE ONE CAPSULE BY MOUTH ONCE DAILY (Patient not taking: Reported on  10/21/2015)   No facility-administered encounter medications on file as of 10/21/2015.        Objective:   Physical Exam  Constitutional: He is oriented to person, place, and time. He appears well-developed and well-nourished.  Cardiovascular: Normal rate, regular rhythm and normal heart sounds.   Pulmonary/Chest: Effort normal and breath sounds normal.  Neurological: He is alert and oriented to person, place, and time.  Psychiatric: He has a normal mood and affect.   BP 118/78 mmHg  Pulse 94  Temp(Src) 97.1 F (36.2 C) (Oral)  Ht _0  (1.727 m)  Wt 200 lb 11.2 oz (91.037 kg)  BMI 30.52 kg/m2        Assessment & Plan:  1. Type 2 diabetes mellitus without complication, unspecified long term insulin use status (HCC) Gross metformin to 500 mg twice a day - POCT glycosylated hemoglobin (Hb A1C)  2. BPH (benign prostatic hyperplasia) Symptoms are stable  3. Hyperlipidemia Tolerates medicines. Need to reassess effect of statin - Lipid panel - CMP14+EGFR  4. Secondary hypertension, unspecified Change lisinopril to losartan 25 mg once a day. This should also help cough - CMP14+EGFR  Wardell Honour MD

## 2015-10-22 LAB — CMP14+EGFR
A/G RATIO: 2.1 (ref 1.1–2.5)
ALBUMIN: 4.7 g/dL (ref 3.6–4.8)
ALK PHOS: 58 IU/L (ref 39–117)
ALT: 24 IU/L (ref 0–44)
AST: 19 IU/L (ref 0–40)
BILIRUBIN TOTAL: 1 mg/dL (ref 0.0–1.2)
BUN / CREAT RATIO: 15 (ref 10–22)
BUN: 15 mg/dL (ref 8–27)
CHLORIDE: 100 mmol/L (ref 97–106)
CO2: 23 mmol/L (ref 18–29)
CREATININE: 0.98 mg/dL (ref 0.76–1.27)
Calcium: 10 mg/dL (ref 8.6–10.2)
GFR calc Af Amer: 92 mL/min/{1.73_m2} (ref >59)
GFR calc non Af Amer: 79 mL/min/{1.73_m2} (ref >59)
GLOBULIN, TOTAL: 2.2 g/dL (ref 1.5–4.5)
Glucose: 201 mg/dL — ABNORMAL HIGH (ref 65–99)
POTASSIUM: 4.9 mmol/L (ref 3.5–5.2)
SODIUM: 139 mmol/L (ref 136–144)
Total Protein: 6.9 g/dL (ref 6.0–8.5)

## 2015-10-22 LAB — LIPID PANEL
CHOLESTEROL TOTAL: 160 mg/dL (ref 100–199)
Chol/HDL Ratio: 4.7 ratio units (ref 0.0–5.0)
HDL: 34 mg/dL — AB (ref >39)
LDL Calculated: 77 mg/dL (ref 0–99)
TRIGLYCERIDES: 246 mg/dL — AB (ref 0–149)
VLDL CHOLESTEROL CAL: 49 mg/dL — AB (ref 5–40)

## 2015-10-22 NOTE — Progress Notes (Signed)
Patient informed via detailed message 

## 2015-11-25 ENCOUNTER — Encounter: Payer: Self-pay | Admitting: *Deleted

## 2015-11-25 LAB — HM DIABETES EYE EXAM

## 2015-12-29 DIAGNOSIS — M542 Cervicalgia: Secondary | ICD-10-CM | POA: Diagnosis not present

## 2016-01-05 DIAGNOSIS — M5442 Lumbago with sciatica, left side: Secondary | ICD-10-CM | POA: Diagnosis not present

## 2016-01-15 ENCOUNTER — Other Ambulatory Visit: Payer: Self-pay | Admitting: Family Medicine

## 2016-01-15 DIAGNOSIS — M5442 Lumbago with sciatica, left side: Secondary | ICD-10-CM | POA: Diagnosis not present

## 2016-01-22 ENCOUNTER — Other Ambulatory Visit: Payer: Self-pay | Admitting: Surgical

## 2016-01-22 DIAGNOSIS — M5442 Lumbago with sciatica, left side: Secondary | ICD-10-CM | POA: Diagnosis not present

## 2016-01-23 NOTE — Patient Instructions (Addendum)
Billy Nolan  01/23/2016   Your procedure is scheduled on: 01-30-16  Report to [facility]  Entrance take [facility]  elevators to 3rd floor to  [place] at 530 AM.  Call this number if you have problems the morning of surgery [phone]   Remember: ONLY 1 PERSON MAY GO WITH YOU TO SHORT STAY TO GET  READY MORNING OF [place].   Do not eat food or drink liquids :After Midnight.     Take these medicines the morning of surgery with A SIP OF WATER: percocet if needed              DO NOT TAKE ANY DIABETIC MEDICATIONS DAY OF YOUR SURGERY!                               You may not have any metal on your body including hair pins and              piercings  Do not wear jewelry, make-up, lotions, powders or perfumes, deodorant             Do not wear nail polish.  Do not shave  48 hours prior to surgery.              Men may shave face and neck.   Do not bring valuables to the hospital. [place] [facility] [place].  Contacts, dentures or bridgework may not be worn into surgery.  Leave suitcase in the car. After surgery it may be brought to your room.                   Please read over the following fact sheets you were given: _____________________________________________________________________             [facility] - Preparing for Surgery Before surgery, you can play an important role.  Because skin is not sterile, your skin needs to be as free of germs as possible.  You can reduce the number of germs on your skin by washing with CHG (chlorahexidine gluconate) soap before surgery.  CHG is an antiseptic cleaner which kills germs and bonds with the skin to continue killing germs even after washing. Please DO NOT use if you have an allergy to CHG or antibacterial soaps.  If your skin becomes reddened/irritated stop using the CHG and inform your nurse when you arrive at Short Stay. Do not shave (including legs and  underarms) for at least 48 hours prior to the first CHG shower.  You may shave your face/neck. Please follow these instructions carefully:  1.  Shower with CHG Soap the night before surgery and the  morning of Surgery.  2.  If you choose to wash your hair, wash your hair first as usual with your  normal  shampoo.  3.  After you shampoo, rinse your hair and body thoroughly to remove the  shampoo.                           4.  Use CHG as you would any other liquid soap.  You can apply chg directly  to the skin and wash  Gently with a scrungie or clean washcloth.  5.  Apply the CHG Soap to your body ONLY FROM THE NECK DOWN.   Do not use on face/ open                           Wound or open sores. Avoid contact with eyes, ears mouth and genitals (private parts).                       Wash face,  Genitals (private parts) with your normal soap.             6.  Wash thoroughly, paying special attention to the area where your surgery  will be performed.  7.  Thoroughly rinse your body with warm water from the neck down.  8.  DO NOT shower/wash with your normal soap after using and rinsing off  the CHG Soap.                9.  Pat yourself dry with a clean towel.            10.  Wear clean pajamas.            11.  Place clean sheets on your bed the night of your first shower and do not  sleep with pets. Day of Surgery : Do not apply any lotions/deodorants the morning of surgery.  Please wear clean clothes to the hospital/surgery center.  FAILURE TO FOLLOW THESE INSTRUCTIONS MAY RESULT IN THE CANCELLATION OF YOUR SURGERY PATIENT SIGNATURE_________________________________  NURSE SIGNATURE__________________________________  ________________________________________________________________________   [name]  An incentive spirometer is a tool that can help keep your lungs clear and active. This tool measures how well you are filling your lungs with each breath. Taking  long deep breaths may help reverse or decrease the chance of developing breathing (pulmonary) problems (especially infection) following:  A long period of time when you are unable to move or be active. BEFORE THE PROCEDURE   If the spirometer includes an indicator to show your best effort, your nurse or respiratory therapist will set it to a desired goal.  If possible, sit up straight or lean slightly forward. Try not to slouch.  Hold the incentive spirometer in an upright position. INSTRUCTIONS FOR USE   Sit on the edge of your bed if possible, or sit up as far as you can in bed or on a chair.  Hold the incentive spirometer in an upright position.  Breathe out normally.  Place the mouthpiece in your mouth and seal your lips tightly around it.  Breathe in slowly and as deeply as possible, raising the piston or the ball toward the top of the column.  Hold your breath for 3-5 seconds or for as long as possible. Allow the piston or ball to fall to the bottom of the column.  Remove the mouthpiece from your mouth and breathe out normally.  Rest for a few seconds and repeat Steps 1 through 7 at least 10 times every 1-2 hours when you are awake. Take your time and take a few normal breaths between deep breaths.  The spirometer may include an indicator to show your best effort. Use the indicator as a goal to work toward during each repetition.  After each set of 10 deep breaths, practice coughing to be sure your lungs are clear. If you have an incision (the cut made at the time of surgery),  support your incision when coughing by placing a pillow or rolled up towels firmly against it. Once you are able to get out of bed, walk around indoors and cough well. You may stop using the incentive spirometer when instructed by your caregiver.  RISKS AND COMPLICATIONS  Take your time so you do not get dizzy or light-headed.  If you are in pain, you may need to take or ask for pain medication before  doing incentive spirometry. It is harder to take a deep breath if you are having pain. AFTER USE  Rest and breathe slowly and easily.  It can be helpful to keep track of a log of your progress. Your caregiver can provide you with a simple table to help with this. If you are using the spirometer at home, follow these instructions: [place] IF:   You are having difficultly using the spirometer.  You have trouble using the spirometer as often as instructed.  Your pain medication is not giving enough relief while using the spirometer.  You develop fever of 100.5 F (38.1 C) or higher. SEEK IMMEDIATE MEDICAL CARE IF:   You cough up bloody sputum that had not been present before.  You develop fever of 102 F (38.9 C) or greater.  You develop worsening pain at or near the incision site. MAKE SURE YOU:   Understand these instructions.  Will watch your condition.  Will get help right away if you are not doing well or get worse. Document Released: 04/04/2007 Document Revised: 02/14/2012 Document Reviewed: 06/05/2007 ExitCare Patient Information 2014 Exit[employer], [place].   ________________________________________________________________________  WHAT IS A BLOOD TRANSFUSION? Blood Transfusion Information  A transfusion is the replacement of blood or some of its parts. Blood is made up of multiple cells which provide different functions.  Red blood cells carry oxygen and are used for blood loss replacement.  White blood cells fight against infection.  Platelets control bleeding.  Plasma helps clot blood.  Other blood products are available for specialized needs, such as hemophilia or other clotting disorders. BEFORE THE TRANSFUSION  Who gives blood for transfusions?   Healthy volunteers who are fully evaluated to make sure their blood is safe. This is blood bank blood. Transfusion therapy is the safest it has ever been in the practice of medicine. Before blood is taken  from a donor, a complete history is taken to make sure that person has no history of diseases nor engages in risky social behavior (examples are intravenous drug use or sexual activity with multiple partners). The donor's travel history is screened to minimize risk of transmitting infections, such as malaria. The donated blood is tested for signs of infectious diseases, such as HIV and hepatitis. The blood is then tested to be sure it is compatible with you in order to minimize the chance of a transfusion reaction. If you or a relative donates blood, this is often done in anticipation of surgery and is not appropriate for emergency situations. It takes many days to process the donated blood. RISKS AND COMPLICATIONS Although transfusion therapy is very safe and saves many lives, the main dangers of transfusion include:   Getting an infectious disease.  Developing a transfusion reaction. This is an allergic reaction to something in the blood you were given. Every precaution is taken to prevent this. The decision to have a blood transfusion has been considered carefully by your caregiver before blood is given. Blood is not given unless the benefits outweigh the risks. AFTER THE TRANSFUSION  Right after receiving a blood transfusion, you will usually feel much better and more energetic. This is especially true if your red blood cells have gotten low (anemic). The transfusion raises the level of the red blood cells which carry oxygen, and this usually causes an energy increase.  The nurse administering the transfusion will monitor you carefully for complications. HOME CARE INSTRUCTIONS  No special instructions are needed after a transfusion. You may find your energy is better. Speak with your caregiver about any limitations on activity for underlying diseases you may have. SEEK MEDICAL CARE IF:   Your condition is not improving after your transfusion.  You develop redness or irritation at the  intravenous (IV) site. SEEK IMMEDIATE MEDICAL CARE IF:  Any of the following symptoms occur over the next 12 hours:  Shaking chills.  You have a temperature by mouth above 102 F (38.9 C), not controlled by medicine.  Chest, back, or muscle pain.  People around you feel you are not acting correctly or are confused.  Shortness of breath or difficulty breathing.  Dizziness and fainting.  You get a rash or develop hives.  You have a decrease in urine output.  Your urine turns a dark color or changes to pink, red, or brown. Any of the following symptoms occur over the next 10 days:  You have a temperature by mouth above 102 F (38.9 C), not controlled by medicine.  Shortness of breath.  Weakness after normal activity.  The white part of the eye turns yellow (jaundice).  You have a decrease in the amount of urine or are urinating less often.  Your urine turns a dark color or changes to pink, red, or brown. Document Released: 11/19/2000 Document Revised: 02/14/2012 Document Reviewed: 07/08/2008 [facility] Patient Information 2014 [place], [place].  _______________________________________________________________________

## 2016-01-27 ENCOUNTER — Encounter (HOSPITAL_COMMUNITY): Payer: Self-pay

## 2016-01-27 ENCOUNTER — Ambulatory Visit (HOSPITAL_COMMUNITY)
Admission: RE | Admit: 2016-01-27 | Discharge: 2016-01-27 | Disposition: A | Discharge status: Home / Self Care | Payer: PPO | Source: Ambulatory Visit | Attending: Surgical | Admitting: Surgical

## 2016-01-27 ENCOUNTER — Encounter (HOSPITAL_COMMUNITY)
Admission: RE | Admit: 2016-01-27 | Discharge: 2016-01-27 | Disposition: A | Discharge status: Home / Self Care | Payer: PPO | Source: Ambulatory Visit | Attending: Orthopedic Surgery | Admitting: Orthopedic Surgery

## 2016-01-27 ENCOUNTER — Ambulatory Visit (HOSPITAL_COMMUNITY): Admission: RE | Admit: 2016-01-27 | Payer: PPO | Source: Ambulatory Visit

## 2016-01-27 DIAGNOSIS — Z01818 Encounter for other preprocedural examination: Secondary | ICD-10-CM | POA: Insufficient documentation

## 2016-01-27 DIAGNOSIS — Z01812 Encounter for preprocedural laboratory examination: Secondary | ICD-10-CM | POA: Diagnosis not present

## 2016-01-27 HISTORY — DX: Gastro-esophageal reflux disease without esophagitis: K21.9

## 2016-01-27 HISTORY — DX: Essential (primary) hypertension: I10

## 2016-01-27 LAB — COMPREHENSIVE METABOLIC PANEL
ALT: 32 U/L (ref 17–63)
AST: 26 U/L (ref 15–41)
Albumin: 4.6 g/dL (ref 3.5–5.0)
Alkaline Phosphatase: 44 U/L (ref 38–126)
Anion gap: 9 (ref 5–15)
BUN: 13 mg/dL (ref 6–20)
CO2: 27 mmol/L (ref 22–32)
Calcium: 10.3 mg/dL (ref 8.9–10.3)
Chloride: 102 mmol/L (ref 101–111)
Creatinine, Ser: 0.89 mg/dL (ref 0.61–1.24)
GFR calc Af Amer: >60 mL/min (ref >60)
GFR calc non Af Amer: >60 mL/min (ref >60)
Glucose, Bld: 176 mg/dL — ABNORMAL HIGH (ref 65–99)
Potassium: 4.6 mmol/L (ref 3.5–5.1)
Sodium: 138 mmol/L (ref 135–145)
Total Bilirubin: 0.7 mg/dL (ref 0.3–1.2)
Total Protein: 7.7 g/dL (ref 6.5–8.1)

## 2016-01-27 LAB — CBC WITH DIFFERENTIAL/PLATELET
Basophils Absolute: 0 10*3/uL (ref 0.0–0.1)
Basophils Relative: 0 %
Eosinophils Absolute: 0.2 10*3/uL (ref 0.0–0.7)
Eosinophils Relative: 3 %
HCT: 48.3 % (ref 39.0–52.0)
Hemoglobin: 15.4 g/dL (ref 13.0–17.0)
Lymphocytes Relative: 35 %
Lymphs Abs: 1.9 10*3/uL (ref 0.7–4.0)
MCH: 30.1 pg (ref 26.0–34.0)
MCHC: 31.9 g/dL (ref 30.0–36.0)
MCV: 94.3 fL (ref 78.0–100.0)
Monocytes Absolute: 0.4 10*3/uL (ref 0.1–1.0)
Monocytes Relative: 7 %
Neutro Abs: 3 10*3/uL (ref 1.7–7.7)
Neutrophils Relative %: 55 %
Platelets: 299 10*3/uL (ref 150–400)
RBC: 5.12 MIL/uL (ref 4.22–5.81)
RDW: 12.5 % (ref 11.5–15.5)
WBC: 5.4 10*3/uL (ref 4.0–10.5)

## 2016-01-27 LAB — PROTIME-INR
INR: 1.11 (ref 0.00–1.49)
Prothrombin Time: 14.1 seconds (ref 11.6–15.2)

## 2016-01-27 LAB — SURGICAL PCR SCREEN
MRSA, PCR: NEGATIVE
Staphylococcus aureus: NEGATIVE

## 2016-01-27 LAB — APTT: aPTT: 28 seconds (ref 24–37)

## 2016-01-27 LAB — ABO/RH: ABO/RH(D): AB POS

## 2016-01-27 NOTE — Progress Notes (Signed)
Consulted with Dr. Finis Bud, Anesthesia about EKG results and patient having a high Pulse rate of 130 when at pre-operative appointment for surgery. BP -151/89. Per Dr. Finis Bud, patient needs to have pre-operative clearance from Primary Care Physician for his surgery on Friday 01/30/2016.

## 2016-01-28 LAB — HEMOGLOBIN A1C
Hgb A1c MFr Bld: 9.7 % — ABNORMAL HIGH (ref 4.8–5.6)
MEAN PLASMA GLUCOSE: 232 mg/dL

## 2016-01-28 NOTE — Progress Notes (Signed)
01-28-16 - HgA1C lab results from preop visit on 01-27-16 faxed to Dr. Gladstone Lighter via [facility]

## 2016-01-29 NOTE — H&P (Signed)
Billy Nolan is an 70 y.o. male.   Chief Complaint: low back pain HPI: The patient is a 70 year old male who presented with the chief complaint of low back pain. He has had the pain for about a month. He has been lifting some heavy pieces of wood and injured his back. He has pain in his back going into his left thigh and left buttock. MRI showed disc herniation at L3-L4 on the left.   Past Medical History  Diagnosis Date  . Diabetes mellitus without complication ([place])   . Hyperlipidemia   . Allergy   . GERD (gastroesophageal reflux disease)   . Hypertension     Past Surgical History  Procedure Laterality Date  . Dental surgeries      teeth extractions, root cannal    Family History  Problem Relation Age of Onset  . Heart disease Mother   . Stroke Father   . Multiple sclerosis Sister   . Colon cancer Neg Hx    Social History:  reports that he has never smoked. He has never used smokeless tobacco. He reports that he does not drink alcohol or use illicit drugs.  Allergies:  Allergies  Allergen Reactions  . Sulfa Antibiotics Rash    Current outpatient prescriptions:  .  aspirin 81 MG tablet, Take 81 mg by mouth 2 (two) times a week. , Disp: , Rfl:  .  Cholecalciferol 1000 UNITS capsule, Take 1 capsule (1,000 Units total) by mouth daily., Disp: 90 capsule, Rfl: 3 .  cyclobenzaprine (FLEXERIL) 10 MG tablet, Take 10 mg by mouth 3 (three) times daily as needed for muscle spasms., Disp: , Rfl:  .  fenofibrate (TRICOR) 145 MG tablet, Take 1 tablet (145 mg total) by mouth daily., Disp: 90 tablet, Rfl: 3 .  Glucosamine-Chondroit-Vit C-Mn (GLUCOSAMINE 1500 COMPLEX PO), Take 2 capsules by mouth every evening. , Disp: , Rfl:  .  losartan (COZAAR) 25 MG tablet, Take 1 tablet (25 mg total) by mouth daily., Disp: 30 tablet, Rfl: 3 .  Magnesium 400 MG CAPS, Take 1 capsule by mouth at bedtime. , Disp: , Rfl:  .  metFORMIN (GLUCOPHAGE) 500 MG tablet, Take 1 tablet (500 mg total) by mouth 2 (two)  times daily with a meal., Disp: 90 tablet, Rfl: 6 .  niacin (NIASPAN) 1000 MG CR tablet, TAKE ONE TABLET BY MOUTH ONCE DAILY WITH BREAKFAST, Disp: 90 tablet, Rfl: 0 .  oxyCODONE-acetaminophen (PERCOCET) 10-325 MG tablet, Take 1 tablet by mouth every 4 (four) hours as needed for pain., Disp: , Rfl:  .  rosuvastatin (CRESTOR) 40 MG tablet, Take 0.5 tablets (20 mg total) by mouth daily., Disp: 90 tablet, Rfl: 3 .  sildenafil (REVATIO) 20 MG tablet, TAKE ONE TO TWO TABLETS BY MOUTH AS NEEDED (Patient taking differently: Take 20 mg by mouth as needed (erectile dystfunction). ), Disp: 30 tablet, Rfl: 0 .  celecoxib (CELEBREX) 200 MG capsule, TAKE ONE CAPSULE BY MOUTH ONCE DAILY (Patient not taking: Reported on 10/21/2015), Disp: 30 capsule, Rfl: 3     Review of Systems  Constitutional: Negative.   HENT: Negative.   Eyes: Negative.   Respiratory: Negative.   Cardiovascular: Negative.   Gastrointestinal: Positive for heartburn. Negative for nausea, vomiting, abdominal pain, diarrhea, constipation, blood in stool and melena.  Genitourinary: Negative.   Musculoskeletal: Positive for myalgias, back pain and joint pain. Negative for falls and neck pain.  Skin: Negative.   Neurological: Negative.   Endo/Heme/Allergies: Negative.   Psychiatric/Behavioral: Negative.  Vitals  Weight: 182 lb Height: 68in Body Surface Area: 1.96 m Body Mass Index: 27.67 kg/m  BP: 145/90 (Sitting, Left Arm, Standard) HR: 76 bpm  Physical Exam  Constitutional: He is oriented to person, place, and time. He appears well-developed and well-nourished. No distress.  HENT:  Head: Normocephalic and atraumatic.  Right Ear: External ear normal.  Left Ear: External ear normal.  Nose: Nose normal.  Mouth/Throat: Oropharynx is clear and moist.  Eyes: Conjunctivae and EOM are normal.  Neck: Normal range of motion. Neck supple.  Cardiovascular: Normal heart sounds and intact distal pulses.   No murmur  heard. Respiratory: Effort normal and breath sounds normal. No respiratory distress. He has no wheezes.  GI: Soft. Bowel sounds are normal. He exhibits no distension. There is no tenderness.  Musculoskeletal:       Right hip: Normal.       Left hip: Normal.       Right knee: Normal.       Left knee: Normal.  Physical exam shows painful motion of his lower back. In fact, he had difficulty moving about. He came in, in a wheelchair. His straight leg raising is negative bilaterally, but it does produce low back pain. His motor and sensory exam of the lower extremities is intact  Neurological: He is alert and oriented to person, place, and time. He has normal strength and normal reflexes. No sensory deficit.  Skin: No rash noted. He is not diaphoretic. No erythema.  Psychiatric: He has a normal mood and affect. His behavior is normal.     Assessment/Plan Lumbar disc herniation L3-L4 left He needs a central decompression lumbar laminectomy and microdiscectomy L3-L4 left. Risks and benefits of the procedure discussed with the patient by Dr. Gladstone Lighter.   H&P performed by Dr. Latanya Maudlin Documented by Ardeen Jourdain, PA-C  Deshante Cassell Ander Purpura, PA-C 01/29/2016, 1:41 PM

## 2016-01-30 ENCOUNTER — Ambulatory Visit (HOSPITAL_COMMUNITY): Payer: PPO | Admitting: Certified Registered"

## 2016-01-30 ENCOUNTER — Ambulatory Visit (HOSPITAL_COMMUNITY): Payer: PPO

## 2016-01-30 ENCOUNTER — Encounter (HOSPITAL_COMMUNITY): Payer: Self-pay | Admitting: *Deleted

## 2016-01-30 ENCOUNTER — Observation Stay (HOSPITAL_COMMUNITY)
Admission: RE | Admit: 2016-01-30 | Discharge: 2016-01-31 | Disposition: A | Discharge status: Home / Self Care | Payer: PPO | Source: Ambulatory Visit | Attending: Orthopedic Surgery | Admitting: Orthopedic Surgery

## 2016-01-30 ENCOUNTER — Encounter (HOSPITAL_COMMUNITY)
Admission: RE | Disposition: A | Discharge status: Home / Self Care | Payer: Self-pay | Source: Ambulatory Visit | Attending: Orthopedic Surgery

## 2016-01-30 DIAGNOSIS — Z791 Long term (current) use of non-steroidal anti-inflammatories (NSAID): Secondary | ICD-10-CM | POA: Diagnosis not present

## 2016-01-30 DIAGNOSIS — M5137 Other intervertebral disc degeneration, lumbosacral region: Secondary | ICD-10-CM | POA: Diagnosis not present

## 2016-01-30 DIAGNOSIS — M5136 Other intervertebral disc degeneration, lumbar region: Secondary | ICD-10-CM | POA: Diagnosis not present

## 2016-01-30 DIAGNOSIS — M5126 Other intervertebral disc displacement, lumbar region: Secondary | ICD-10-CM | POA: Diagnosis not present

## 2016-01-30 DIAGNOSIS — Z886 Allergy status to analgesic agent status: Secondary | ICD-10-CM | POA: Insufficient documentation

## 2016-01-30 DIAGNOSIS — M48062 Spinal stenosis, lumbar region with neurogenic claudication: Secondary | ICD-10-CM | POA: Diagnosis present

## 2016-01-30 DIAGNOSIS — Z7984 Long term (current) use of oral hypoglycemic drugs: Secondary | ICD-10-CM | POA: Insufficient documentation

## 2016-01-30 DIAGNOSIS — Z419 Encounter for procedure for purposes other than remedying health state, unspecified: Secondary | ICD-10-CM

## 2016-01-30 DIAGNOSIS — Z9889 Other specified postprocedural states: Secondary | ICD-10-CM | POA: Diagnosis not present

## 2016-01-30 DIAGNOSIS — K219 Gastro-esophageal reflux disease without esophagitis: Secondary | ICD-10-CM | POA: Insufficient documentation

## 2016-01-30 DIAGNOSIS — E785 Hyperlipidemia, unspecified: Secondary | ICD-10-CM | POA: Insufficient documentation

## 2016-01-30 DIAGNOSIS — M4806 Spinal stenosis, lumbar region: Secondary | ICD-10-CM | POA: Diagnosis not present

## 2016-01-30 DIAGNOSIS — E119 Type 2 diabetes mellitus without complications: Secondary | ICD-10-CM | POA: Insufficient documentation

## 2016-01-30 DIAGNOSIS — Z79899 Other long term (current) drug therapy: Secondary | ICD-10-CM | POA: Insufficient documentation

## 2016-01-30 DIAGNOSIS — I10 Essential (primary) hypertension: Secondary | ICD-10-CM | POA: Diagnosis not present

## 2016-01-30 DIAGNOSIS — M9973 Connective tissue and disc stenosis of intervertebral foramina of lumbar region: Secondary | ICD-10-CM | POA: Diagnosis not present

## 2016-01-30 HISTORY — PX: LUMBAR LAMINECTOMY/DECOMPRESSION MICRODISCECTOMY: SHX5026

## 2016-01-30 LAB — GLUCOSE, CAPILLARY
GLUCOSE-CAPILLARY: 211 mg/dL — AB (ref 65–99)
GLUCOSE-CAPILLARY: 239 mg/dL — AB (ref 65–99)
Glucose-Capillary: 132 mg/dL — ABNORMAL HIGH (ref 65–99)
Glucose-Capillary: 189 mg/dL — ABNORMAL HIGH (ref 65–99)
Glucose-Capillary: 274 mg/dL — ABNORMAL HIGH (ref 65–99)

## 2016-01-30 LAB — TYPE AND SCREEN
ABO/RH(D): AB POS
Antibody Screen: NEGATIVE

## 2016-01-30 SURGERY — LUMBAR LAMINECTOMY/DECOMPRESSION MICRODISCECTOMY
Anesthesia: General | Site: Back | Laterality: Left

## 2016-01-30 MED ORDER — LABETALOL HCL 5 MG/ML IV SOLN
INTRAVENOUS | Status: AC
  Filled 2016-01-30: qty 4

## 2016-01-30 MED ORDER — CHLORHEXIDINE GLUCONATE 4 % EX LIQD: 60.0000 mL | Freq: Once | CUTANEOUS | Status: DC

## 2016-01-30 MED ORDER — HYDROCODONE-ACETAMINOPHEN 5-325 MG PO TABS
1.0000 | ORAL_TABLET | ORAL | Status: DC | PRN
  Administered 2016-01-31: 1 via ORAL
  Filled 2016-01-30: qty 1

## 2016-01-30 MED ORDER — ONDANSETRON HCL 4 MG/2ML IJ SOLN: 4.0000 mg | INTRAMUSCULAR | Status: DC | PRN

## 2016-01-30 MED ORDER — METHOCARBAMOL 500 MG PO TABS
500.0000 mg | ORAL_TABLET | Freq: Four times a day (QID) | ORAL | Status: DC | PRN
  Administered 2016-01-30 – 2016-01-31 (×2): 500 mg via ORAL
  Filled 2016-01-30 (×2): qty 1

## 2016-01-30 MED ORDER — OXYCODONE-ACETAMINOPHEN 5-325 MG PO TABS: 1.0000 | ORAL_TABLET | ORAL | Status: DC | PRN

## 2016-01-30 MED ORDER — PHENYLEPHRINE HCL 10 MG/ML IJ SOLN
INTRAMUSCULAR | Status: DC | PRN
  Administered 2016-01-30: 40 ug via INTRAVENOUS

## 2016-01-30 MED ORDER — CEFAZOLIN SODIUM-DEXTROSE 2-3 GM-% IV SOLR
INTRAVENOUS | Status: AC
  Filled 2016-01-30: qty 50

## 2016-01-30 MED ORDER — LIDOCAINE HCL (CARDIAC) 20 MG/ML IV SOLN
INTRAVENOUS | Status: AC
  Filled 2016-01-30: qty 5

## 2016-01-30 MED ORDER — CEFAZOLIN SODIUM-DEXTROSE 2-3 GM-% IV SOLR
2.0000 g | INTRAVENOUS | Status: AC
  Administered 2016-01-30: 2 g via INTRAVENOUS

## 2016-01-30 MED ORDER — PHENOL 1.4 % MT LIQD: 1.0000 | OROMUCOSAL | Status: DC | PRN

## 2016-01-30 MED ORDER — LACTATED RINGERS IV SOLN
INTRAVENOUS | Status: DC
  Administered 2016-01-30: 07:00:00 via INTRAVENOUS

## 2016-01-30 MED ORDER — DEXAMETHASONE SODIUM PHOSPHATE 10 MG/ML IJ SOLN
INTRAMUSCULAR | Status: AC
  Filled 2016-01-30: qty 1

## 2016-01-30 MED ORDER — SODIUM CHLORIDE 0.9 % IR SOLN
Status: DC | PRN
  Administered 2016-01-30: 500 mL

## 2016-01-30 MED ORDER — DEXAMETHASONE SODIUM PHOSPHATE 10 MG/ML IJ SOLN
INTRAMUSCULAR | Status: DC | PRN
  Administered 2016-01-30: 10 mg via INTRAVENOUS

## 2016-01-30 MED ORDER — FENTANYL CITRATE (PF) 250 MCG/5ML IJ SOLN
INTRAMUSCULAR | Status: DC | PRN
  Administered 2016-01-30: 50 ug via INTRAVENOUS
  Administered 2016-01-30: 100 ug via INTRAVENOUS
  Administered 2016-01-30 (×2): 50 ug via INTRAVENOUS

## 2016-01-30 MED ORDER — BUPIVACAINE LIPOSOME 1.3 % IJ SUSP
20.0000 mL | Freq: Once | INTRAMUSCULAR | Status: AC
  Administered 2016-01-30: 20 mL
  Filled 2016-01-30: qty 20

## 2016-01-30 MED ORDER — OXYCODONE HCL 5 MG PO TABS: 10.0000 mg | ORAL_TABLET | Freq: Once | ORAL | Status: DC | PRN

## 2016-01-30 MED ORDER — FENTANYL CITRATE (PF) 250 MCG/5ML IJ SOLN
INTRAMUSCULAR | Status: AC
  Filled 2016-01-30: qty 5

## 2016-01-30 MED ORDER — INSULIN ASPART 100 UNIT/ML ~~LOC~~ SOLN
0.0000 [IU] | Freq: Three times a day (TID) | SUBCUTANEOUS | Status: DC
  Administered 2016-01-30: 5 [IU] via SUBCUTANEOUS
  Administered 2016-01-30: 8 [IU] via SUBCUTANEOUS
  Administered 2016-01-31: 3 [IU] via SUBCUTANEOUS

## 2016-01-30 MED ORDER — GLYCOPYRROLATE 0.2 MG/ML IJ SOLN
INTRAMUSCULAR | Status: AC
  Filled 2016-01-30: qty 3

## 2016-01-30 MED ORDER — PROPOFOL 10 MG/ML IV BOLUS
INTRAVENOUS | Status: DC | PRN
  Administered 2016-01-30: 150 mg via INTRAVENOUS

## 2016-01-30 MED ORDER — POLYETHYLENE GLYCOL 3350 17 G PO PACK: 17.0000 g | PACK | Freq: Every day | ORAL | Status: DC | PRN

## 2016-01-30 MED ORDER — ACETAMINOPHEN 325 MG PO TABS: 650.0000 mg | ORAL_TABLET | ORAL | Status: DC | PRN

## 2016-01-30 MED ORDER — HYDROMORPHONE HCL 1 MG/ML IJ SOLN
INTRAMUSCULAR | Status: DC | PRN
  Administered 2016-01-30: .4 mg via INTRAVENOUS

## 2016-01-30 MED ORDER — OXYCODONE-ACETAMINOPHEN 5-325 MG PO TABS
1.0000 | ORAL_TABLET | ORAL | Status: DC | PRN
  Administered 2016-01-30 (×2): 1 via ORAL
  Filled 2016-01-30 (×2): qty 1

## 2016-01-30 MED ORDER — ACETAMINOPHEN 650 MG RE SUPP: 650.0000 mg | RECTAL | Status: DC | PRN

## 2016-01-30 MED ORDER — HYDROMORPHONE HCL 2 MG/ML IJ SOLN
INTRAMUSCULAR | Status: AC
  Filled 2016-01-30: qty 1

## 2016-01-30 MED ORDER — SODIUM CHLORIDE 0.9 % IR SOLN
Status: AC
  Filled 2016-01-30: qty 1

## 2016-01-30 MED ORDER — HYDROMORPHONE HCL 1 MG/ML IJ SOLN: 0.5000 mg | INTRAMUSCULAR | Status: DC | PRN

## 2016-01-30 MED ORDER — BUPIVACAINE-EPINEPHRINE 0.5% -1:200000 IJ SOLN
INTRAMUSCULAR | Status: DC | PRN
  Administered 2016-01-30: 20 mL

## 2016-01-30 MED ORDER — FLEET ENEMA 7-19 GM/118ML RE ENEM: 1.0000 | ENEMA | Freq: Once | RECTAL | Status: DC | PRN

## 2016-01-30 MED ORDER — HYDROMORPHONE HCL 1 MG/ML IJ SOLN
0.2500 mg | INTRAMUSCULAR | Status: DC | PRN
  Administered 2016-01-30 (×2): 0.5 mg via INTRAVENOUS

## 2016-01-30 MED ORDER — ONDANSETRON HCL 4 MG/2ML IJ SOLN
INTRAMUSCULAR | Status: DC | PRN
  Administered 2016-01-30: 4 mg via INTRAVENOUS

## 2016-01-30 MED ORDER — OXYCODONE HCL 5 MG/5ML PO SOLN
5.0000 mg | Freq: Once | ORAL | Status: DC | PRN
  Filled 2016-01-30: qty 5

## 2016-01-30 MED ORDER — METHOCARBAMOL 500 MG PO TABS: 500.0000 mg | ORAL_TABLET | Freq: Four times a day (QID) | ORAL | Status: DC | PRN

## 2016-01-30 MED ORDER — BACITRACIN-NEOMYCIN-POLYMYXIN 400-5-5000 EX OINT
TOPICAL_OINTMENT | CUTANEOUS | Status: AC
  Filled 2016-01-30: qty 1

## 2016-01-30 MED ORDER — LABETALOL HCL 5 MG/ML IV SOLN
INTRAVENOUS | Status: DC | PRN
  Administered 2016-01-30: 5 mg via INTRAVENOUS

## 2016-01-30 MED ORDER — CEFAZOLIN SODIUM 1-5 GM-% IV SOLN
1.0000 g | Freq: Three times a day (TID) | INTRAVENOUS | Status: AC
  Administered 2016-01-30 – 2016-01-31 (×3): 1 g via INTRAVENOUS
  Filled 2016-01-30 (×3): qty 50

## 2016-01-30 MED ORDER — METHOCARBAMOL 1000 MG/10ML IJ SOLN
500.0000 mg | Freq: Four times a day (QID) | INTRAVENOUS | Status: DC | PRN
  Administered 2016-01-30: 500 mg via INTRAVENOUS
  Filled 2016-01-30 (×2): qty 5

## 2016-01-30 MED ORDER — SUCCINYLCHOLINE CHLORIDE 20 MG/ML IJ SOLN
INTRAMUSCULAR | Status: DC | PRN
  Administered 2016-01-30: 100 mg via INTRAVENOUS

## 2016-01-30 MED ORDER — BACITRACIN-NEOMYCIN-POLYMYXIN 400-5-5000 EX OINT
TOPICAL_OINTMENT | CUTANEOUS | Status: DC | PRN
  Administered 2016-01-30: 1 via TOPICAL

## 2016-01-30 MED ORDER — BUPIVACAINE-EPINEPHRINE (PF) 0.5% -1:200000 IJ SOLN
INTRAMUSCULAR | Status: AC
  Filled 2016-01-30: qty 30

## 2016-01-30 MED ORDER — HYDROMORPHONE HCL 1 MG/ML IJ SOLN
INTRAMUSCULAR | Status: AC
  Filled 2016-01-30: qty 1

## 2016-01-30 MED ORDER — NEOSTIGMINE METHYLSULFATE 10 MG/10ML IV SOLN
INTRAVENOUS | Status: DC | PRN
  Administered 2016-01-30: 4 mg via INTRAVENOUS

## 2016-01-30 MED ORDER — THROMBIN 5000 UNITS EX SOLR
CUTANEOUS | Status: DC | PRN
  Administered 2016-01-30: 10000 [IU] via TOPICAL

## 2016-01-30 MED ORDER — OXYCODONE-ACETAMINOPHEN 10-325 MG PO TABS: 1.0000 | ORAL_TABLET | ORAL | Status: DC | PRN

## 2016-01-30 MED ORDER — ONDANSETRON HCL 4 MG/2ML IJ SOLN
INTRAMUSCULAR | Status: AC
  Filled 2016-01-30: qty 2

## 2016-01-30 MED ORDER — PROMETHAZINE HCL 25 MG/ML IJ SOLN: 6.2500 mg | INTRAMUSCULAR | Status: DC | PRN

## 2016-01-30 MED ORDER — ROCURONIUM BROMIDE 100 MG/10ML IV SOLN
INTRAVENOUS | Status: DC | PRN
  Administered 2016-01-30: 30 mg via INTRAVENOUS
  Administered 2016-01-30: 10 mg via INTRAVENOUS

## 2016-01-30 MED ORDER — ROSUVASTATIN CALCIUM 20 MG PO TABS
20.0000 mg | ORAL_TABLET | Freq: Every day | ORAL | Status: DC
  Administered 2016-01-30: 20 mg via ORAL
  Filled 2016-01-30 (×2): qty 1

## 2016-01-30 MED ORDER — ROCURONIUM BROMIDE 100 MG/10ML IV SOLN: INTRAVENOUS | Status: DC | PRN

## 2016-01-30 MED ORDER — GLYCOPYRROLATE 0.2 MG/ML IJ SOLN
INTRAMUSCULAR | Status: DC | PRN
  Administered 2016-01-30: 0.6 mg via INTRAVENOUS

## 2016-01-30 MED ORDER — MENTHOL 3 MG MT LOZG: 1.0000 | LOZENGE | OROMUCOSAL | Status: DC | PRN

## 2016-01-30 MED ORDER — LACTATED RINGERS IV SOLN
INTRAVENOUS | Status: DC
  Administered 2016-01-30: 11:00:00 via INTRAVENOUS

## 2016-01-30 MED ORDER — NEOSTIGMINE METHYLSULFATE 10 MG/10ML IV SOLN
INTRAVENOUS | Status: AC
  Filled 2016-01-30: qty 1

## 2016-01-30 MED ORDER — OXYCODONE HCL 5 MG PO TABS
5.0000 mg | ORAL_TABLET | ORAL | Status: DC | PRN
Start: 2016-01-30 — End: 2016-01-31
  Administered 2016-01-30 (×2): 5 mg via ORAL
  Filled 2016-01-30 (×2): qty 1

## 2016-01-30 MED ORDER — LACTATED RINGERS IV SOLN
INTRAVENOUS | Status: DC
  Administered 2016-01-30: 23:00:00 via INTRAVENOUS

## 2016-01-30 MED ORDER — LOSARTAN POTASSIUM 25 MG PO TABS
25.0000 mg | ORAL_TABLET | Freq: Every day | ORAL | Status: DC
  Administered 2016-01-30: 25 mg via ORAL
  Filled 2016-01-30 (×2): qty 1

## 2016-01-30 MED ORDER — LIDOCAINE HCL (CARDIAC) 20 MG/ML IV SOLN
INTRAVENOUS | Status: DC | PRN
  Administered 2016-01-30: 60 mg via INTRATRACHEAL

## 2016-01-30 MED ORDER — ONDANSETRON 4 MG PO TBDP
ORAL_TABLET | ORAL | Status: AC
  Filled 2016-01-30: qty 1

## 2016-01-30 MED ORDER — THROMBIN 5000 UNITS EX SOLR
CUTANEOUS | Status: AC
  Filled 2016-01-30: qty 10000

## 2016-01-30 MED ORDER — BISACODYL 5 MG PO TBEC: 5.0000 mg | DELAYED_RELEASE_TABLET | Freq: Every day | ORAL | Status: DC | PRN

## 2016-01-30 SURGICAL SUPPLY — 55 items
BAG ZIPLOCK 12X15 (MISCELLANEOUS) IMPLANT
BENZOIN TINCTURE PRP APPL 2/3 (GAUZE/BANDAGES/DRESSINGS) ×3 IMPLANT
CLEANER TIP ELECTROSURG 2X2 (MISCELLANEOUS) ×3 IMPLANT
DRAIN PENROSE 18X1/4 LTX STRL (WOUND CARE) IMPLANT
DRAPE MICROSCOPE LEICA (MISCELLANEOUS) ×3 IMPLANT
DRAPE POUCH INSTRU U-SHP 10X18 (DRAPES) ×3 IMPLANT
DRAPE SHEET LG 3/4 BI-LAMINATE (DRAPES) ×3 IMPLANT
DRAPE SURG 17X11 SM STRL (DRAPES) ×3 IMPLANT
DRSG ADAPTIC 3X8 NADH LF (GAUZE/BANDAGES/DRESSINGS) ×3 IMPLANT
DRSG PAD ABDOMINAL 8X10 ST (GAUZE/BANDAGES/DRESSINGS) ×12 IMPLANT
DRSG TELFA 3X8 NADH (GAUZE/BANDAGES/DRESSINGS) ×3 IMPLANT
DURAPREP 26ML APPLICATOR (WOUND CARE) ×3 IMPLANT
ELECT BLADE TIP CTD 4 INCH (ELECTRODE) ×3 IMPLANT
ELECT REM PT RETURN 9FT ADLT (ELECTROSURGICAL) ×3
ELECTRODE REM PT RTRN 9FT ADLT (ELECTROSURGICAL) ×1 IMPLANT
GAUZE SPONGE 4X4 12PLY STRL (GAUZE/BANDAGES/DRESSINGS) ×3 IMPLANT
GLOVE BIO SURGEON STRL SZ 6.5 (GLOVE) ×2 IMPLANT
GLOVE BIO SURGEONS STRL SZ 6.5 (GLOVE) ×1
GLOVE BIOGEL PI IND STRL 6.5 (GLOVE) ×1 IMPLANT
GLOVE BIOGEL PI IND STRL 7.5 (GLOVE) ×1 IMPLANT
GLOVE BIOGEL PI IND STRL 8 (GLOVE) ×1 IMPLANT
GLOVE BIOGEL PI INDICATOR 6.5 (GLOVE) ×2
GLOVE BIOGEL PI INDICATOR 7.5 (GLOVE) ×2
GLOVE BIOGEL PI INDICATOR 8 (GLOVE) ×2
GLOVE ECLIPSE 8.0 STRL XLNG CF (GLOVE) ×6 IMPLANT
GLOVE SURG SS PI 7.5 STRL IVOR (GLOVE) ×3 IMPLANT
GOWN STRL REUS W/ TWL LRG LVL3 (GOWN DISPOSABLE) ×1 IMPLANT
GOWN STRL REUS W/TWL LRG LVL3 (GOWN DISPOSABLE) ×5 IMPLANT
GOWN STRL REUS W/TWL XL LVL3 (GOWN DISPOSABLE) ×3 IMPLANT
KIT BASIN OR (CUSTOM PROCEDURE TRAY) ×3 IMPLANT
KIT POSITIONING SURG ANDREWS (MISCELLANEOUS) ×3 IMPLANT
MANIFOLD NEPTUNE II (INSTRUMENTS) ×3 IMPLANT
MARKER SKIN DUAL TIP RULER LAB (MISCELLANEOUS) ×3 IMPLANT
NEEDLE HYPO 22GX1.5 SAFETY (NEEDLE) ×6 IMPLANT
NEEDLE SPNL 18GX3.5 QUINCKE PK (NEEDLE) ×9 IMPLANT
PACK LAMINECTOMY ORTHO (CUSTOM PROCEDURE TRAY) ×3 IMPLANT
PAD ABD 8X10 STRL (GAUZE/BANDAGES/DRESSINGS) ×3 IMPLANT
PATTIES SURGICAL .5 X.5 (GAUZE/BANDAGES/DRESSINGS) IMPLANT
PATTIES SURGICAL .75X.75 (GAUZE/BANDAGES/DRESSINGS) ×3 IMPLANT
PATTIES SURGICAL 1X1 (DISPOSABLE) ×3 IMPLANT
PIN SAFETY NICK PLATE  2 MED (MISCELLANEOUS)
PIN SAFETY NICK PLATE 2 MED (MISCELLANEOUS) IMPLANT
SPONGE LAP 4X18 X RAY DECT (DISPOSABLE) ×12 IMPLANT
SPONGE SURGIFOAM ABS GEL 100 (HEMOSTASIS) ×3 IMPLANT
STAPLER VISISTAT 35W (STAPLE) ×3 IMPLANT
SUT VIC AB 0 CT1 27 (SUTURE) ×2
SUT VIC AB 0 CT1 27XBRD ANTBC (SUTURE) ×1 IMPLANT
SUT VIC AB 1 CT1 27 (SUTURE) ×6
SUT VIC AB 1 CT1 27XBRD ANTBC (SUTURE) ×3 IMPLANT
SUT VIC AB 2-0 CT1 27 (SUTURE) ×4
SUT VIC AB 2-0 CT1 TAPERPNT 27 (SUTURE) ×2 IMPLANT
SYR 20CC LL (SYRINGE) ×6 IMPLANT
TAPE CLOTH SURG 4X10 WHT LF (GAUZE/BANDAGES/DRESSINGS) ×3 IMPLANT
TOWEL OR 17X26 10 PK STRL BLUE (TOWEL DISPOSABLE) ×3 IMPLANT
TOWEL OR NON WOVEN STRL DISP B (DISPOSABLE) ×3 IMPLANT

## 2016-01-30 NOTE — Evaluation (Signed)
Physical Therapy Evaluation Patient Details Name: Billy Nolan MRN: [id number] DOB: 25-May-1946 Today's Date: 01/30/2016   History of Present Illness  Pt s/p L3-4 central decompressive laminectomy  Clinical Impression  Pt s/p back surgery and presenting with functional mobility limitations 2* post op pain and back precautions.  Pt should progress to dc home with family assist.    Follow Up Recommendations No PT follow up    Equipment Recommendations  None recommended by PT    Recommendations for Other Services OT consult     Precautions / Restrictions Precautions Precautions: Back Precaution Comments: Reveiewed all back precautions x 2 Restrictions Weight Bearing Restrictions: No      Mobility  Bed Mobility Overal bed mobility: Needs Assistance Bed Mobility: Supine to Sit     Supine to sit: Min assist     General bed mobility comments: cues for correct log roll technique and adherence to back precautions  Transfers Overall transfer level: Needs assistance Equipment used: Rolling walker (2 wheeled) Transfers: Sit to/from Stand Sit to Stand: Min assist         General transfer comment: cues for transition position, adherence to back precautions and use of UEs to self assist  Ambulation/Gait Ambulation/Gait assistance: Min assist;Min guard Ambulation Distance (Feet): 200 Feet Assistive device: Rolling walker (2 wheeled) Gait Pattern/deviations: Step-to pattern;Step-through pattern;Decreased step length - right;Decreased step length - left;Shuffle;Trunk flexed Gait velocity: decr Gait velocity interpretation: Below normal speed for age/gender General Gait Details: cues for posture, position from RW and adherence to back precautions  Stairs            Wheelchair Mobility    Modified Rankin (Stroke Patients Only)       Balance                                             Pertinent Vitals/Pain Pain Assessment: 0-10 Pain Score: 2   Pain Location: back Pain Descriptors / Indicators: Aching;Sore Pain Intervention(s): Limited activity within patient's tolerance;Monitored during session;Premedicated before session    [place] expects to be discharged to:: Private residence Living Arrangements: Spouse/significant other Available Help at Discharge: Family Type of Home: House Home Access: Stairs to enter Entrance Stairs-Rails: None Entrance Stairs-Number of Steps: 2 Home Layout: One level Home Equipment: Walker - 2 wheels;Crutches      Prior Function Level of Independence: Independent;Independent with assistive device(s)         Comments: Used crutches 2* leg pain     Hand Dominance        Extremity/Trunk Assessment   Upper Extremity Assessment: Overall WFL for tasks assessed           Lower Extremity Assessment: Overall WFL for tasks assessed         Communication   Communication: No difficulties  Cognition Arousal/Alertness: Awake/alert Behavior During Therapy: WFL for tasks assessed/performed Overall Cognitive Status: Within Functional Limits for tasks assessed                      General Comments      Exercises        Assessment/Plan    PT Assessment Patient needs continued PT services  PT Diagnosis Difficulty walking   PT Problem List Decreased strength;Decreased range of motion;Decreased activity tolerance;Decreased mobility;Decreased knowledge of use of DME;Pain;Decreased knowledge of precautions  PT Treatment Interventions DME instruction;Gait training;Stair  training;Functional mobility training;Therapeutic activities;Therapeutic exercise;Patient/family education   PT Goals (Current goals can be found in the Care Plan section) Acute Rehab PT Goals Patient Stated Goal: Regain IND PT Goal Formulation: With patient Time For Goal Achievement: 02/02/16 Potential to Achieve Goals: Good    Frequency 7X/week   Barriers to discharge         Co-evaluation               End of Session   Activity Tolerance: Patient tolerated treatment well Patient left: in chair;with call bell/phone within reach;with chair alarm set;with family/visitor present Nurse Communication: Mobility status    Functional Assessment Tool Used: clinical judgement Functional Limitation: Mobility: Walking and moving around Mobility: Walking and Moving Around Current Status [id number]): At least 20 percent but less than 40 percent impaired, limited or restricted Mobility: Walking and Moving Around Goal Status [phone]): At least 1 percent but less than 20 percent impaired, limited or restricted    Time: 1529-1555 PT Time Calculation (min) (ACUTE ONLY): 26 min   Charges:   PT Evaluation $PT Eval Low Complexity: 1 Procedure PT Treatments $Gait Training: 8-22 mins   PT G Codes:   PT G-Codes **NOT FOR INPATIENT CLASS** Functional Assessment Tool Used: clinical judgement Functional Limitation: Mobility: Walking and moving around Mobility: Walking and Moving Around Current Status [id number]): At least 20 percent but less than 40 percent impaired, limited or restricted Mobility: Walking and Moving Around Goal Status [phone]): At least 1 percent but less than 20 percent impaired, limited or restricted    [facility] 01/30/2016, 4:22 PM

## 2016-01-30 NOTE — Op Note (Signed)
NAMERACER, BREAN NO.:  [id number]  MEDICAL RECORD NO.:  [id number]  LOCATION:  [facility]                         FACILITY:  [facility]  PHYSICIAN:  Kipp Brood. Kinzly Pierrelouis, M.D.DATE OF BIRTH:  01/06/1946  DATE OF PROCEDURE:  01/30/2016 DATE OF DISCHARGE:                              OPERATIVE REPORT   SURGEON:  Kipp Brood. Gladstone Lighter, M.D.  ASSISTANT:  Ardeen Jourdain, [place]  PREOPERATIVE DIAGNOSES: 1. Spinal stenosis at L3-4. 2. Large herniated disk at L3-4 on the left. 3. Weakness of hip flexion on the left.  POSTOPERATIVE DIAGNOSES: 1. Spinal stenosis at L3-4. 2. Large herniated disk at L3-4 on the left. 3. Weakness of hip flexion on the left.  OPERATIONS: 1. Central decompressive lumbar laminectomy at L3-4 for spinal     stenosis. 2. Foraminotomy for the L4 root on the left for foraminal stenosis. 3. Microdiskectomy, L3-4 on the left.  DESCRIPTION OF PROCEDURE:  Under general anesthesia, routine orthopedic prep and draping of the lower back was carried out, the patient was on his spinal frame.  After the appropriate time-out was carried out, I also marked the appropriate left side of the back in the holding area. Two needles were placed in the back for localization purposes.  X-ray was taken.  Incision then was made over the L3-4 and L4-5 with spinous processes.  Bleeders were identified and cauterized.  The muscle was stripped from the lamina and spinous processes bilaterally.  Another x- ray was taken with Kocher clamps in place.  Following this, the self- retaining retractors were inserted, then I began the central decompressive lumbar laminectomy at L3-4.  I went far out laterally and decompressed the lateral recess on the left.  I did a foraminotomy for the L4 root on the left first.  The nerve root was quite swollen.  We then identified the disk space, brought the microscope in and I did the procedure.  Needle was placed in the disk space.  X-ray was taken  prior to put the needle in just to localize the space.  I then made a cruciate incision in the posterior longitudinal ligament, utilized the nerve hook and removed the large disk fragment with small disk fragments.  The multiple attempts were made to remove the loose pieces of disk.  I thoroughly irrigated out the area.  I then was able to easily take the hockey-stick out the foramen for the L4 root and also proximally for the 3 root.  We had a nice decompression of the root and the dura.  The dura was freely movable as well.  We then irrigated the wound and then loosely applied some thrombin-soaked Gelfoam, closed the wound layers in usual fashion.  I left a small distal deep and proximal part of the wound open for drainage purposes.  Subcu was closed with #1 Vicryl, skin with metal staples.  At the beginning of the case, I injected 20 mL of 0.5% Marcaine and epinephrine into the soft tissue.  At the end of the case, I injected 20 mL of Exparel into the soft tissue.  Sterile dressings were applied.  The patient had 2 g of IV Ancef preop.  ______________________________ Kipp Brood Gladstone Lighter, M.D.     RAG/MEDQ  D:  01/30/2016  T:  01/30/2016  Job:  [id number]

## 2016-01-30 NOTE — Interval H&P Note (Signed)
History and Physical Interval Note:  01/30/2016 7:10 AM  Billy Nolan  has presented today for surgery, with the diagnosis of herniated lumbar disc L3 - L4 and stenosis  The various methods of treatment have been discussed with the patient and family. After consideration of risks, benefits and other options for treatment, the patient has consented to  Procedure(s): LUMBAR LAMINECTOMY/CENTRAL DECOMPRESSION MICRODISCECTOMY L3 - L4 ON THE LEFT (Left) as a surgical intervention .  The patient's history has been reviewed, patient examined, no change in status, stable for surgery.  I have reviewed the patient's chart and labs.  Questions were answered to the patient's satisfaction.     Aarion Kittrell A

## 2016-01-30 NOTE — Brief Op Note (Signed)
01/30/2016  9:09 AM  PATIENT:  Billy Nolan  70 y.o. male  PRE-OPERATIVE DIAGNOSIS:  herniated lumbar disc L3 - L4 on the left and severe Spinal stenosis. Weakness of left Hip Flexors.  POST-OPERATIVE DIAGNOSIS:  Same as Pre-Op  PROCEDURE:  Procedure(s): LUMBAR LAMINECTOMY/CENTRAL DECOMPRESSION MICRODISCECTOMY L3 - L4 ON THE LEFT (Left) for Herrniated Lumbar Disc and Spinal Stenosis.  SURGEON:  Surgeon(s) and Role:    * Latanya Maudlin, MD - Primary  PHYSICIAN ASSISTANT:Amber [place] PA   ASSISTANTS:Amber [place] PA  ANESTHESIA:   general  EBL:  Total I/O In: -  Out: 100 [Blood:100]  BLOOD ADMINISTERED:none  DRAINS: none   LOCAL MEDICATIONS USED:  MARCAINE 20cc of 0.50% with Epinephrine at start of case and 20cc of Exparel at the end of the case.    SPECIMEN:  Source of Specimen:  L-3-L-4  DISPOSITION OF SPECIMEN:  PATHOLOGY  COUNTS:  YES  TOURNIQUET:  * No tourniquets in log *  DICTATION: .Other Dictation: Dictation Number Stable   PLAN OF CARE: Admit for overnight observation  PATIENT DISPOSITION:  PACU - hemodynamically stable.   Delay start of Pharmacological VTE agent (>24hrs) due to surgical blood loss or risk of bleeding: yes

## 2016-01-30 NOTE — Transfer of Care (Signed)
Immediate Anesthesia Transfer of Care Note  Patient: Billy Nolan  Procedure(s) Performed: Procedure(s): LUMBAR LAMINECTOMY/CENTRAL DECOMPRESSION MICRODISCECTOMY L3 - L4 ON THE LEFT (Left)  Patient Location: PACU  Anesthesia Type:General  Level of Consciousness: awake and patient cooperative  Airway & Oxygen Therapy: Patient Spontanous Breathing and Patient connected to face mask oxygen  Post-op Assessment: Report given to RN and Post -op Vital signs reviewed and stable  Post vital signs: Reviewed and stable  Last Vitals:  Filed Vitals:   01/30/16 0508  BP: 138/85  Pulse: 118  Temp: 36.6 C  Resp: 18    Complications: No apparent anesthesia complications

## 2016-01-30 NOTE — Discharge Instructions (Signed)
For the first three days, remove your dressing, tape a piece of saran wrap over your incision °Take your shower, then remove the saran wrap and put a clean dressing on °After three days you can shower without the saran wrap.  °No lifting or driving  °Call Dr. Gioffre if any wound complications or temperature of 101 degrees F or over.  °Call the office for an appointment to see Dr. Gioffre in two weeks: [phone] and ask for Dr. Gioffre's nurse, Tammy Johnson.  °

## 2016-01-30 NOTE — Progress Notes (Signed)
Dr. Jillyn Hidden made aware of patient's heart rates

## 2016-01-30 NOTE — Anesthesia Preprocedure Evaluation (Addendum)
Anesthesia Evaluation  Patient identified by MRN, date of birth, ID band Patient awake    Reviewed: Allergy & Precautions, H&P , NPO status , Patient's Chart, lab work & pertinent test results  History of Anesthesia Complications Negative for: history of anesthetic complications  Airway Mallampati: III  TM Distance: >3 FB Neck ROM: full    Dental no notable dental hx. (+) Teeth Intact   Pulmonary neg pulmonary ROS,    Pulmonary exam normal breath sounds clear to auscultation       Cardiovascular hypertension, On Medications Normal cardiovascular exam Rhythm:regular Rate:Normal     Neuro/Psych negative neurological ROS     GI/Hepatic Neg liver ROS, GERD  ,  Endo/Other  diabetes, Poorly Controlled, Type 2, Oral Hypoglycemic AgentsHgA1C is 9.7%  Renal/GU negative Renal ROS     Musculoskeletal   Abdominal   Peds  Hematology negative hematology ROS (+)   Anesthesia Other Findings METS >4, denies reflux this am, no chest pain or syncope  Reproductive/Obstetrics negative OB ROS                           Anesthesia Physical Anesthesia Plan  ASA: II  Anesthesia Plan: General   Post-op Pain Management:    Induction: Intravenous  Airway Management Planned: Oral ETT  Additional Equipment:   Intra-op Plan:   Post-operative Plan: Extubation in OR  Informed Consent: I have reviewed the patients History and Physical, chart, labs and discussed the procedure including the risks, benefits and alternatives for the proposed anesthesia with the patient or authorized representative who has indicated his/her understanding and acceptance.   Dental Advisory Given  Plan Discussed with: Anesthesiologist, CRNA and Surgeon  Anesthesia Plan Comments:        Anesthesia Quick Evaluation

## 2016-01-30 NOTE — Anesthesia Postprocedure Evaluation (Signed)
Anesthesia Post Note  Patient: Billy Nolan  Procedure(s) Performed: Procedure(s) (LRB): LUMBAR LAMINECTOMY/CENTRAL DECOMPRESSION MICRODISCECTOMY L3 - L4 ON THE LEFT (Left)  Patient location during evaluation: PACU Anesthesia Type: General Level of consciousness: awake and alert Pain management: pain level controlled Vital Signs Assessment: post-procedure vital signs reviewed and stable Respiratory status: spontaneous breathing, nonlabored ventilation, respiratory function stable and patient connected to nasal cannula oxygen Cardiovascular status: blood pressure returned to baseline and stable Postop Assessment: no signs of nausea or vomiting Anesthetic complications: no    Last Vitals:  Filed Vitals:   01/30/16 1015 01/30/16 1030  BP: 137/84 133/82  Pulse: 104 108  Temp:    Resp: 12 21    Last Pain:  Filed Vitals:   01/30/16 1032  PainSc: 7                  Zenaida Deed

## 2016-01-30 NOTE — Anesthesia Procedure Notes (Signed)
Procedure Name: Intubation Date/Time: 01/30/2016 7:20 AM Performed by: Dione Booze Pre-anesthesia Checklist: Emergency Drugs available, Suction available, Patient being monitored and Patient identified Patient Re-evaluated:Patient Re-evaluated prior to inductionOxygen Delivery Method: Circle system utilized Preoxygenation: Pre-oxygenation with 100% oxygen Intubation Type: IV induction Laryngoscope Size: Mac and 4 Grade View: Grade II Tube type: Oral Number of attempts: 1 Airway Equipment and Method: Stylet Placement Confirmation: ETT inserted through vocal cords under direct vision and breath sounds checked- equal and bilateral Secured at: 22 cm Tube secured with: Tape Dental Injury: Teeth and Oropharynx as per pre-operative assessment

## 2016-01-31 DIAGNOSIS — M5126 Other intervertebral disc displacement, lumbar region: Secondary | ICD-10-CM | POA: Diagnosis not present

## 2016-01-31 LAB — GLUCOSE, CAPILLARY: GLUCOSE-CAPILLARY: 191 mg/dL — AB (ref 65–99)

## 2016-01-31 NOTE — Progress Notes (Signed)
01/31/16  0840  Reviewed discharge instructions with patient. Patient verbalized understanding of discharge instructions. Copy of discharge instructions and prescriptions given to patient.

## 2016-01-31 NOTE — Progress Notes (Signed)
Subjective: 1 Day Post-Op Procedure(s) (LRB): LUMBAR LAMINECTOMY/CENTRAL DECOMPRESSION MICRODISCECTOMY L3 - L4 ON THE LEFT (Left) Patient reports pain as 1 on 0-10 scale. Doing very well today. Has voided and no further leg pain. Will DC   Objective: Vital signs in last 24 hours: Temp:  [97.6 F (36.4 C)-98.6 F (37 C)] 98 F (36.7 C) (02/25 0539) Pulse Rate:  [96-125] 101 (02/25 0539) Resp:  [9-22] 16 (02/25 0539) BP: (104-146)/(59-84) 110/67 mmHg (02/25 0539) SpO2:  [95 %-100 %] 100 % (02/25 0539)  Intake/Output from previous day: 02/24 0701 - 02/25 0700 In: 2895 [P.O.:1440; I.V.:1400; IV Piggyback:55] Out: 2150 [Urine:2050; Blood:100] Intake/Output this shift:    No results for input(s): HGB in the last 72 hours. No results for input(s): WBC, RBC, HCT, PLT in the last 72 hours. No results for input(s): NA, K, CL, CO2, BUN, CREATININE, GLUCOSE, CALCIUM in the last 72 hours. No results for input(s): LABPT, INR in the last 72 hours.  Neurologically intact  Assessment/Plan: 1 Day Post-Op Procedure(s) (LRB): LUMBAR LAMINECTOMY/CENTRAL DECOMPRESSION MICRODISCECTOMY L3 - L4 ON THE LEFT (Left) Up with therapy Discharge home with home health  Billy Nolan A 01/31/2016, 7:40 AM

## 2016-01-31 NOTE — Progress Notes (Signed)
Physical Therapy Treatment Patient Details Name: Billy Nolan MRN: [id number] DOB: 01-May-1946 Today's Date: 01/31/2016    History of Present Illness Pt s/p L3-4 central decompressive laminectomy    PT Comments    Pt progressing well with mobility and eager for return home.  Pt reviewed back precautions with written instructions provided.  Pt and spouse state comfortable with ability to progress to home.  Follow Up Recommendations  No PT follow up     Equipment Recommendations  None recommended by PT    Recommendations for Other Services OT consult     Precautions / Restrictions Precautions Precautions: Back Precaution Comments: Reviewed all back precautions x 2 Restrictions Weight Bearing Restrictions: No    Mobility  Bed Mobility               General bed mobility comments: Pt OOB.  Pt and spouse state comfortable with ability to move in/out bed  Transfers Overall transfer level: Needs assistance Equipment used: Rolling walker (2 wheeled) Transfers: Sit to/from Stand Sit to Stand: Supervision         General transfer comment: cues for transition position, adherence to back precautions and use of UEs to self assist.  Including on/off comode with use of rail.  Pt states can borrow 3n1 from family if needed  Ambulation/Gait Ambulation/Gait assistance: Min guard;Supervision Ambulation Distance (Feet): 400 Feet Assistive device: Rolling walker (2 wheeled) Gait Pattern/deviations: Step-through pattern;Shuffle   Gait velocity interpretation: Below normal speed for age/gender General Gait Details: min cues for posture, position from RW and adherence to back precautions   Stairs Stairs: Yes Stairs assistance: Min assist Stair Management: Step to pattern;Forwards Number of Stairs: 2 General stair comments: cues for sequence.  Pt states 2 sons will be there to assist into house  Wheelchair Mobility    Modified Rankin (Stroke Patients Only)        Balance                                    Cognition Arousal/Alertness: Awake/alert Behavior During Therapy: WFL for tasks assessed/performed Overall Cognitive Status: Within Functional Limits for tasks assessed                      Exercises      General Comments        Pertinent Vitals/Pain Pain Assessment: 0-10 Pain Score: 2  Pain Location: back Pain Descriptors / Indicators: Sore Pain Intervention(s): Limited activity within patient's tolerance;Monitored during session;Premedicated before session    Home Living                      Prior Function            PT Goals (current goals can now be found in the care plan section) Acute Rehab PT Goals Patient Stated Goal: Regain IND PT Goal Formulation: With patient Time For Goal Achievement: 02/02/16 Potential to Achieve Goals: Good Progress towards PT goals: Progressing toward goals    Frequency  7X/week    PT Plan Current plan remains appropriate    Co-evaluation             End of Session   Activity Tolerance: Patient tolerated treatment well Patient left: in chair;with call bell/phone within reach;with family/visitor present     Time: [id number] PT Time Calculation (min) (ACUTE ONLY): 16 min  Charges:  $Gait Training: 8-22 mins  G Codes:      Billy Nolan 02-27-16, 11:41 AM

## 2016-02-02 ENCOUNTER — Encounter (HOSPITAL_COMMUNITY): Payer: Self-pay | Admitting: Orthopedic Surgery

## 2016-02-02 NOTE — Discharge Summary (Signed)
Physician Discharge Summary   Patient ID: Billy Nolan MRN: [id number] DOB/AGE: December 28, 1945 70 y.o.  Admit date: 01/30/2016 Discharge date: 01/31/2016  Primary Diagnosis: Lumbar disc herniation   Admission Diagnoses:  Past Medical History  Diagnosis Date  . Diabetes mellitus without complication ([place])   . Hyperlipidemia   . Allergy   . GERD (gastroesophageal reflux disease)   . Hypertension    Discharge Diagnoses:   Active Problems:   Spinal stenosis, lumbar region, with neurogenic claudication  Estimated body mass index is 25.61 kg/(m^2) as calculated from the following:   Height as of this encounter: _0  (1.727 m).   Weight as of this encounter: 76.374 kg (168 lb 6 oz).  Procedure:  Procedure(s) (LRB): LUMBAR LAMINECTOMY/CENTRAL DECOMPRESSION MICRODISCECTOMY L3 - L4 ON THE LEFT (Left)   Consults: None  HPI: The patient is a 70 year old male who presented with the chief complaint of low back pain. He has had the pain for about a month. He has been lifting some heavy pieces of wood and injured his back. He has pain in his back going into his left thigh and left buttock. MRI showed disc herniation at L3-L4 on the left.   Laboratory Data: Admission on 01/30/2016, Discharged on 01/31/2016  Component Date Value Ref Range Status  . Glucose-Capillary 01/30/2016 132* 65 - 99 mg/dL Final  . Glucose-Capillary 01/30/2016 189* 65 - 99 mg/dL Final  . Glucose-Capillary 01/30/2016 211* 65 - 99 mg/dL Final  . Glucose-Capillary 01/30/2016 274* 65 - 99 mg/dL Final  . Glucose-Capillary 01/30/2016 239* 65 - 99 mg/dL Final  . Glucose-Capillary 01/31/2016 191* 65 - 99 mg/dL Final  Hospital Outpatient Visit on 01/27/2016  Component Date Value Ref Range Status  . MRSA, PCR 01/27/2016 NEGATIVE  NEGATIVE Final  . Staphylococcus aureus 01/27/2016 NEGATIVE  NEGATIVE Final   Comment:        The Xpert SA Assay (FDA approved for NASAL specimens in patients over 67 years of age), is one  component of a comprehensive surveillance program.  Test performance has been validated by [facility] for patients greater than or equal to 30 year old. It is not intended to diagnose infection nor to guide or monitor treatment.   Marland Kitchen aPTT 01/27/2016 28  24 - 37 seconds Final  . WBC 01/27/2016 5.4  4.0 - 10.5 K/uL Final  . RBC 01/27/2016 5.12  4.22 - 5.81 MIL/uL Final  . Hemoglobin 01/27/2016 15.4  13.0 - 17.0 g/dL Final  . HCT 01/27/2016 48.3  39.0 - 52.0 % Final  . MCV 01/27/2016 94.3  78.0 - 100.0 fL Final  . MCH 01/27/2016 30.1  26.0 - 34.0 pg Final  . MCHC 01/27/2016 31.9  30.0 - 36.0 g/dL Final  . RDW 01/27/2016 12.5  11.5 - 15.5 % Final  . Platelets 01/27/2016 299  150 - 400 K/uL Final  . Neutrophils Relative % 01/27/2016 55   Final  . Neutro Abs 01/27/2016 3.0  1.7 - 7.7 K/uL Final  . Lymphocytes Relative 01/27/2016 35   Final  . Lymphs Abs 01/27/2016 1.9  0.7 - 4.0 K/uL Final  . Monocytes Relative 01/27/2016 7   Final  . Monocytes Absolute 01/27/2016 0.4  0.1 - 1.0 K/uL Final  . Eosinophils Relative 01/27/2016 3   Final  . Eosinophils Absolute 01/27/2016 0.2  0.0 - 0.7 K/uL Final  . Basophils Relative 01/27/2016 0   Final  . Basophils Absolute 01/27/2016 0.0  0.0 - 0.1 K/uL Final  . Sodium  01/27/2016 138  135 - 145 mmol/L Final  . Potassium 01/27/2016 4.6  3.5 - 5.1 mmol/L Final  . Chloride 01/27/2016 102  101 - 111 mmol/L Final  . CO2 01/27/2016 27  22 - 32 mmol/L Final  . Glucose, Bld 01/27/2016 176* 65 - 99 mg/dL Final  . BUN 01/27/2016 13  6 - 20 mg/dL Final  . Creatinine, Ser 01/27/2016 0.89  0.61 - 1.24 mg/dL Final  . Calcium 01/27/2016 10.3  8.9 - 10.3 mg/dL Final  . Total Protein 01/27/2016 7.7  6.5 - 8.1 g/dL Final  . Albumin 01/27/2016 4.6  3.5 - 5.0 g/dL Final  . AST 01/27/2016 26  15 - 41 U/L Final  . ALT 01/27/2016 32  17 - 63 U/L Final  . Alkaline Phosphatase 01/27/2016 44  38 - 126 U/L Final  . Total Bilirubin 01/27/2016 0.7  0.3 - 1.2 mg/dL Final  .  GFR calc non Af Amer 01/27/2016 >60  >60 mL/min Final  . GFR calc Af Amer 01/27/2016 >60  >60 mL/min Final   Comment: (NOTE) The eGFR has been calculated using the CKD EPI equation. This calculation has not been validated in all clinical situations. eGFR's persistently <60 mL/min signify possible Chronic Kidney Disease.   . Anion gap 01/27/2016 9  5 - 15 Final  . Prothrombin Time 01/27/2016 14.1  11.6 - 15.2 seconds Final  . INR 01/27/2016 1.11  0.00 - 1.49 Final  . ABO/RH(D) 01/27/2016 AB POS   Final  . Antibody Screen 01/27/2016 NEG   Final  . Sample Expiration 01/27/2016 02/02/2016   Final  . Extend sample reason 01/27/2016 NO TRANSFUSIONS OR PREGNANCY IN THE PAST 3 MONTHS   Final  . Hgb A1c MFr Bld 01/27/2016 9.7* 4.8 - 5.6 % Final   Comment: (NOTE)         Pre-diabetes: 5.7 - 6.4         Diabetes: >6.4         Glycemic control for adults with diabetes: <7.0   . Mean Plasma Glucose 01/27/2016 232   Final   Comment: (NOTE) Performed At: [facility] [place], [place] [id number] Lindon Romp MD [id number]   . ABO/RH(D) 01/27/2016 AB POS   Final     X-Rays:Dg Chest 2 View  01/27/2016  CLINICAL DATA:  Preoperative evaluation for upcoming lumbar surgery EXAM: CHEST  2 VIEW COMPARISON:  None. FINDINGS: The heart size and mediastinal contours are within normal limits. Both lungs are clear. The visualized skeletal structures are unremarkable. IMPRESSION: No active cardiopulmonary disease. Electronically Signed   By: Inez Catalina M.D.   On: 01/27/2016 09:44   Dg Lumbar Spine 2-3 Views  01/27/2016  CLINICAL DATA:  Preop for lumbar surgery 01/30/2016 EXAM: LUMBAR SPINE - 2-3 VIEW COMPARISON:  None. FINDINGS: Two views of lumbar spine submitted. No acute fracture or subluxation. There is mild disc space flattening with anterior spurring at L4-L5 level. Mild disc space flattening at L5-S1 level. Facet degenerative changes noted L5 level. Minimal lower lumbar  dextroscoliosis. IMPRESSION: No acute fracture or subluxation. Degenerative changes as described above. Minimal lower lumbar dextroscoliosis. Electronically Signed   By: Lahoma Crocker M.D.   On: 01/27/2016 09:45   Dg Spine Portable 1 View  01/30/2016  CLINICAL DATA:  Decompressive laminectomy EXAM: PORTABLE SPINE - 1 VIEW COMPARISON:  Studies obtained earlier in the day FINDINGS: Cross-table lateral lumbar image labeled #4 submitted. Metallic probe tip is posterior to the L3-4 interspace  level. No fracture or spondylolisthesis. There is disc space narrowing at L4-5 and L5-S1. IMPRESSION: Metallic probe tip posterior to the L3-4 interspace level. Disc space narrowing persists at L4-5 and L5-S1. Electronically Signed   By: Lowella Grip III M.D.   On: 01/30/2016 08:55   Dg Spine Portable 1 View  01/30/2016  CLINICAL DATA:  Surgical level L3-L4. EXAM: PORTABLE SPINE - 1 VIEW COMPARISON:  Intraoperative lumbar spine radiographs - earlier same day; lumbar spine radiographs - 01/27/2016 FINDINGS: A single spot lateral projection radiograph of the lower lumbar spine is provided for review. Spinal labeling is in keeping with preprocedural lumbar spine radiographs. Provided image demonstrates a radiopaque marking instrument overlying the soft tissues posterior to the L3 vertebral body. Additional radiopaque support apparatus is seen posterior to the operative site. IMPRESSION: Intraoperative localization of L3 as above. Electronically Signed   By: Sandi Mariscal M.D.   On: 01/30/2016 08:29   Dg Spine Portable 1 View  01/30/2016  CLINICAL DATA:  Lumbar decompression EXAM: PORTABLE SPINE - 1 VIEW COMPARISON:  Study obtained earlier in the day FINDINGS: Cross-table lateral image labeled #2 submitted. The more superior probe tip is located posterior to the L3-4 interspace level. The more inferior metallic probe tip is posterior to the mid L4 vertebral body. No fracture or spondylolisthesis. Moderate disc space narrowing  at L4-5 and L5-S1 are stable. IMPRESSION: Metallic probe tips are posterior to the L3-4 interspace level and mid L4 vertebral body respectively. Stable disc space narrowing L4-5 and L5-S1. Electronically Signed   By: Lowella Grip III M.D.   On: 01/30/2016 08:09   Dg Spine Portable 1 View  01/30/2016  CLINICAL DATA:  Lumbar decompression EXAM: PORTABLE SPINE - 1 VIEW COMPARISON:  None. FINDINGS: Cross-table lateral lumbar image labeled #1 submitted. Metallic probe tips are located posterior to the mid L4 vertebral body level and posterior to the superior aspect of the L5 vertebral body. There is moderate disc space narrowing at L4-5 and L5-S1. No fracture or spondylolisthesis. IMPRESSION: Probe tips are posterior to the mid L4 vertebral body level and superior L5 vertebral body level respectively. No fracture or spondylolisthesis. Disc space narrowing noted at L4-5 and L5-S1. Electronically Signed   By: Lowella Grip III M.D.   On: 01/30/2016 07:50    EKG: Orders placed or performed during the hospital encounter of 01/27/16  . EKG  . EKG     Hospital Course: Billy Nolan is a 70 y.o. who was admitted to [facility]. They were brought to the operating room on 01/30/2016 and underwent Procedure(s): LUMBAR LAMINECTOMY/CENTRAL DECOMPRESSION MICRODISCECTOMY L3 - L4 ON THE LEFT.  Patient tolerated the procedure well and was later transferred to the recovery room and then to the orthopaedic floor for postoperative care.  They were given PO and IV analgesics for pain control following their surgery.  They were given 24 hours of postoperative antibiotics of  Anti-infectives    Start     Dose/Rate Route Frequency Ordered Stop   01/30/16 1600  ceFAZolin (ANCEF) IVPB 1 g/50 mL premix     1 g 100 mL/hr over 30 Minutes Intravenous 3 times per day 01/30/16 1052 01/31/16 0714   01/30/16 0743  polymyxin B 500,000 Units, bacitracin 50,000 Units in sodium chloride irrigation 0.9 % 500 mL irrigation   Status:  Discontinued       As needed 01/30/16 0750 01/30/16 0930   01/30/16 0505  ceFAZolin (ANCEF) IVPB 2 g/50 mL premix  2 g 100 mL/hr over 30 Minutes Intravenous On call to O.R. 01/30/16 0505 01/30/16 0736     and started on DVT prophylaxis in the form of Aspirin.   PT and OT were ordered. Discharge planning consulted to help with postop disposition and equipment needs.  Patient had a good night on the evening of surgery.  They started to get up OOB with therapy on day one. Dressing was changed and the incision was clean and dry. Patient was seen in rounds and was ready to go home.   Diet: Cardiac diet and Diabetic diet Activity:WBAT Follow-up:in 2 weeks Disposition - Home Discharged Condition: stable   Discharge Instructions    Call MD / Call 911    Complete by:  As directed   If you experience chest pain or shortness of breath, CALL 911 and be transported to the hospital emergency room.  If you develope a fever above 101 F, pus (white drainage) or increased drainage or redness at the wound, or calf pain, call your surgeon's office.     Constipation Prevention    Complete by:  As directed   Drink plenty of fluids.  Prune juice may be helpful.  You may use a stool softener, such as Colace (over the counter) 100 mg twice a day.  Use MiraLax (over the counter) for constipation as needed.     Diet - low sodium heart healthy    Complete by:  As directed      Diet Carb Modified    Complete by:  As directed      Discharge instructions    Complete by:  As directed   For the first three days, remove your dressing, tape a piece of saran wrap over your incision Take your shower, then remove the saran wrap and put a clean dressing on. After three days you can shower without the saran wrap.  No lifting or driving.  Call Dr. Gladstone Lighter if any wound complications or temperature of 101 degrees F or over.  Call the office for an appointment to see Dr. Gladstone Lighter in two weeks: [phone] and ask  for Dr. Charlestine Night nurse, Brunilda Payor.     Increase activity slowly as tolerated    Complete by:  As directed             Medication List    STOP taking these medications        celecoxib 200 MG capsule  Commonly known as:  CELEBREX     cyclobenzaprine 10 MG tablet  Commonly known as:  FLEXERIL     oxyCODONE-acetaminophen 10-325 MG tablet  Commonly known as:  PERCOCET  Replaced by:  oxyCODONE-acetaminophen 5-325 MG tablet      TAKE these medications        aspirin 81 MG tablet  Take 81 mg by mouth 2 (two) times a week.     Cholecalciferol 1000 units capsule  Take 1 capsule (1,000 Units total) by mouth daily.     fenofibrate 145 MG tablet  Commonly known as:  TRICOR  Take 1 tablet (145 mg total) by mouth daily.     GLUCOSAMINE 1500 COMPLEX PO  Take 2 capsules by mouth every evening.     losartan 25 MG tablet  Commonly known as:  COZAAR  Take 1 tablet (25 mg total) by mouth daily.     Magnesium 400 MG Caps  Take 1 capsule by mouth at bedtime.     metFORMIN 500 MG tablet  Commonly known as:  GLUCOPHAGE  Take 1 tablet (500 mg total) by mouth 2 (two) times daily with a meal.     methocarbamol 500 MG tablet  Commonly known as:  ROBAXIN  Take 1 tablet (500 mg total) by mouth every 6 (six) hours as needed for muscle spasms.     niacin 1000 MG CR tablet  Commonly known as:  NIASPAN  TAKE ONE TABLET BY MOUTH ONCE DAILY WITH BREAKFAST     oxyCODONE-acetaminophen 5-325 MG tablet  Commonly known as:  PERCOCET/ROXICET  Take 1-2 tablets by mouth every 4 (four) hours as needed for moderate pain.     rosuvastatin 40 MG tablet  Commonly known as:  CRESTOR  Take 0.5 tablets (20 mg total) by mouth daily.     sildenafil 20 MG tablet  Commonly known as:  REVATIO  TAKE ONE TO TWO TABLETS BY MOUTH AS NEEDED           Follow-up Information    Follow up with GIOFFRE,RONALD A, MD In 2 weeks.   Specialty:  Orthopedic Surgery   Contact information:   [address] [place] [address] [phone]       Signed: Ardeen Jourdain, PA-C Orthopaedic Surgery 02/02/2016, 10:44 AM

## 2016-04-19 ENCOUNTER — Ambulatory Visit (INDEPENDENT_AMBULATORY_CARE_PROVIDER_SITE_OTHER): Payer: PPO | Admitting: Family Medicine

## 2016-04-19 ENCOUNTER — Encounter: Payer: Self-pay | Admitting: Family Medicine

## 2016-04-19 VITALS — BP 105/72 | HR 98 | Temp 97.6°F | Ht 68.0 in | Wt 182.0 lb

## 2016-04-19 DIAGNOSIS — E785 Hyperlipidemia, unspecified: Secondary | ICD-10-CM | POA: Diagnosis not present

## 2016-04-19 DIAGNOSIS — R000 Tachycardia, unspecified: Secondary | ICD-10-CM

## 2016-04-19 DIAGNOSIS — E119 Type 2 diabetes mellitus without complications: Secondary | ICD-10-CM

## 2016-04-19 DIAGNOSIS — N400 Enlarged prostate without lower urinary tract symptoms: Secondary | ICD-10-CM

## 2016-04-19 MED ORDER — METFORMIN HCL 1000 MG PO TABS: 1000.0000 mg | ORAL_TABLET | Freq: Two times a day (BID) | ORAL | Status: DC

## 2016-04-19 NOTE — Progress Notes (Addendum)
Subjective:    Patient ID: Billy Nolan, male    DOB: 1946-06-30, 70 y.o.   MRN: [id number]  HPI Pt here for follow up and management of chronic medical problems which includes diabetes and hyperlipidemia. He is taking medications regularly. He had back surgery per gr orthopedics and is doing much better. While in the hospital his A1c was checked and it was 9.7. He has been on metformin 500 mg twice a day. There are concerns about his blood pressure and pulse. Pulse is been as high as 108. Also there are concerns about hearing. He has been in a noisy environment most of his work life and I suspect that is affecting his hearing today.     Patient Active Problem List   Diagnosis Date Noted  . Spinal stenosis, lumbar region, with neurogenic claudication 01/30/2016  . DM (diabetes mellitus) ([place]) 10/24/2013  . Hyperlipidemia 10/24/2013   Outpatient Encounter Prescriptions as of 04/19/2016  Medication Sig  . aspirin 81 MG tablet Take 81 mg by mouth 2 (two) times a week.   . Cholecalciferol 1000 UNITS capsule Take 1 capsule (1,000 Units total) by mouth daily.  . fenofibrate (TRICOR) 145 MG tablet Take 1 tablet (145 mg total) by mouth daily.  . Glucosamine-Chondroit-Vit C-Mn (GLUCOSAMINE 1500 COMPLEX PO) Take 2 capsules by mouth every evening.   Marland Kitchen losartan (COZAAR) 25 MG tablet Take 1 tablet (25 mg total) by mouth daily.  . Magnesium 400 MG CAPS Take 1 capsule by mouth at bedtime.   . metFORMIN (GLUCOPHAGE) 500 MG tablet Take 1 tablet (500 mg total) by mouth 2 (two) times daily with a meal.  . niacin (NIASPAN) 1000 MG CR tablet TAKE ONE TABLET BY MOUTH ONCE DAILY WITH BREAKFAST  . rosuvastatin (CRESTOR) 40 MG tablet Take 0.5 tablets (20 mg total) by mouth daily.  . sildenafil (REVATIO) 20 MG tablet TAKE ONE TO TWO TABLETS BY MOUTH AS NEEDED (Patient taking differently: Take 20 mg by mouth as needed (erectile dystfunction). )  . [DISCONTINUED] methocarbamol (ROBAXIN) 500 MG tablet Take 1  tablet (500 mg total) by mouth every 6 (six) hours as needed for muscle spasms.  . [DISCONTINUED] oxyCODONE-acetaminophen (PERCOCET/ROXICET) 5-325 MG tablet Take 1-2 tablets by mouth every 4 (four) hours as needed for moderate pain.  . [DISCONTINUED] celecoxib (CELEBREX) 200 MG capsule TAKE ONE CAPSULE BY MOUTH ONCE DAILY (Patient not taking: Reported on 10/21/2015)  . [DISCONTINUED] fenofibrate (TRICOR) 145 MG tablet TAKE ONE TABLET BY MOUTH ONCE DAILY  . [DISCONTINUED] glucose blood (FREESTYLE LITE) test strip 1 each by Other route 2 (two) times daily.  . [DISCONTINUED] lisinopril (PRINIVIL,ZESTRIL) 5 MG tablet TAKE ONE TABLET BY MOUTH ONCE DAILY  . [DISCONTINUED] metFORMIN (GLUCOPHAGE) 500 MG tablet Take 1 tablet (500 mg total) by mouth daily with breakfast.  . [DISCONTINUED] niacin (NIASPAN) 1000 MG CR tablet TAKE ONE TABLET BY MOUTH ONCE DAILY AT BEDTIME  . [DISCONTINUED] rosuvastatin (CRESTOR) 40 MG tablet Take 0.5 tablets (20 mg total) by mouth daily.  . [DISCONTINUED] sildenafil (REVATIO) 20 MG tablet TAKE ONE TO TWO TABLETS BY MOUTH AS NEEDED   No facility-administered encounter medications on file as of 04/19/2016.         Review of Systems  Constitutional: Negative.   HENT: Negative.        Trouble hearing  Eyes: Negative.   Respiratory: Negative.   Cardiovascular: Negative.        Bp and HR readings at home are elevated.  Gastrointestinal: Negative.  Endocrine: Negative.   Genitourinary: Negative.   Musculoskeletal: Negative.   Skin: Negative.   Allergic/Immunologic: Negative.   Neurological: Negative.   Hematological: Negative.   Psychiatric/Behavioral: Negative.        Objective:   Physical Exam  Constitutional: He is oriented to person, place, and time. He appears well-developed and well-nourished.  HENT:  Head: Normocephalic.  Right Ear: External ear normal.  Left Ear: External ear normal.  Cardiovascular: Normal rate and regular rhythm.   Pulmonary/Chest:  Effort normal and breath sounds normal.  Neurological: He is alert and oriented to person, place, and time.  He does have a slight tremor involving right hand (dominant). It is more a rest tremor and there is a suggesting of some rigidity and cogwheeling. Need to follow this along.  Psychiatric: He has a normal mood and affect.   BP 105/72 mmHg  Pulse 98  Temp(Src) 97.6 F (36.4 C) (Oral)  Ht 5\' 8"  (1.727 m)  Wt 182 lb (82.555 kg)  BMI 27.68 kg/m2        Assessment & Plan:  1. Type 2 diabetes mellitus without complication, unspecified long term insulin use status (HCC) Recommend increase metformin to 1000 mg twice a day and check A1c and another 1-2 months  2. BPH (benign prostatic hyperplasia) No active symptoms  3. Hyperlipidemia He takes both Niaspan and TriCor and Crestor  4. Elevated pulse rate I do not think this is an issue that needs treatment. Reassured - TSH  Wardell Honour MD

## 2016-04-19 NOTE — Addendum Note (Signed)
Addended by: Wardell Honour on: 04/19/2016 09:50 AM   Modules accepted: Miquel Dunn

## 2016-04-19 NOTE — Addendum Note (Signed)
Addended by: Zannie Cove on: 04/19/2016 09:10 AM   Modules accepted: SmartSet

## 2016-04-19 NOTE — Patient Instructions (Addendum)
Medicare Annual Wellness Visit  [facility] and the medical providers at [facility] strive to bring you the best medical care.  In doing so we not only want to address your current medical conditions and concerns but also to detect new conditions early and prevent illness, disease and health-related problems.    Medicare offers a yearly Wellness Visit which allows our clinical staff to assess your need for preventative services including immunizations, lifestyle education, counseling to decrease risk of preventable diseases and screening for fall risk and other medical concerns.    This visit is provided free of charge (no copay) for all Medicare recipients. The clinical pharmacists at [facility] have begun to conduct these Wellness Visits which will also include a thorough review of all your medications.    As you primary medical provider recommend that you make an appointment for your Annual Wellness Visit if you have not done so already this year.  You may set up this appointment before you leave today or you may call back [id number] and schedule an appointment.  Please make sure when you call that you mention that you are scheduling your Annual Wellness Visit with the clinical pharmacist so that the appointment may be made for the proper length of time.     Continue current medications. Continue good therapeutic lifestyle changes which include good diet and exercise. Fall precautions discussed with patient. If an FOBT was given today- please return it to our front desk. If you are over 64 years old - you may need Prevnar 89 or the adult Pneumonia vaccine.  **Flu shots are available--- please call and schedule a FLU-CLINIC appointment**  After your visit with [place] today you will receive a survey in the mail or online from [employer] regarding your care with [place]. Please take a moment to fill this out. Your feedback is very  important to [place] as you can help [place] better understand your patient needs as well as improve your experience and satisfaction. WE CARE ABOUT YOU!!!   Hypertension Hypertension, commonly called high blood pressure, is when the force of blood pumping through your arteries is too strong. Your arteries are the blood vessels that carry blood from your heart throughout your body. A blood pressure reading consists of a higher number over a lower number, such as 110/72. The higher number (systolic) is the pressure inside your arteries when your heart pumps. The lower number (diastolic) is the pressure inside your arteries when your heart relaxes. Ideally you want your blood pressure below 120/80. Hypertension forces your heart to work harder to pump blood. Your arteries may become narrow or stiff. Having untreated or uncontrolled hypertension can cause heart attack, stroke, kidney disease, and other problems. RISK FACTORS Some risk factors for high blood pressure are controllable. Others are not.  Risk factors you cannot control include:   Race. You may be at higher risk if you are African American.  Age. Risk increases with age.  Gender. Men are at higher risk than women before age 60 years. After age 53, women are at higher risk than men. Risk factors you can control include:  Not getting enough exercise or physical activity.  Being overweight.  Getting too much fat, sugar, calories, or salt in your diet.  Drinking too much alcohol. SIGNS AND SYMPTOMS Hypertension does not usually cause signs or symptoms. Extremely high blood pressure (hypertensive crisis) may cause headache, anxiety, shortness of breath, and nosebleed. DIAGNOSIS To check if you  have hypertension, your health care provider will measure your blood pressure while you are seated, with your arm held at the level of your heart. It should be measured at least twice using the same arm. Certain conditions can cause a difference in blood  pressure between your right and left arms. A blood pressure reading that is higher than normal on one occasion does not mean that you need treatment. If it is not clear whether you have high blood pressure, you may be asked to return on a different day to have your blood pressure checked again. Or, you may be asked to monitor your blood pressure at home for 1 or more weeks. TREATMENT Treating high blood pressure includes making lifestyle changes and possibly taking medicine. Living a healthy lifestyle can help lower high blood pressure. You may need to change some of your habits. Lifestyle changes may include:  Following the DASH diet. This diet is high in fruits, vegetables, and whole grains. It is low in salt, red meat, and added sugars.  Keep your sodium intake below 2,300 mg per day.  Getting at least 30-45 minutes of aerobic exercise at least 4 times per week.  Losing weight if necessary.  Not smoking.  Limiting alcoholic beverages.  Learning ways to reduce stress. Your health care provider may prescribe medicine if lifestyle changes are not enough to get your blood pressure under control, and if one of the following is true:  You are 27-69 years of age and your systolic blood pressure is above 140.  You are 55 years of age or older, and your systolic blood pressure is above 150.  Your diastolic blood pressure is above 90.  You have diabetes, and your systolic blood pressure is over [id number] or your diastolic blood pressure is over 90.  You have kidney disease and your blood pressure is above 140/90.  You have heart disease and your blood pressure is above 140/90. Your personal target blood pressure may vary depending on your medical conditions, your age, and other factors. HOME CARE INSTRUCTIONS  Have your blood pressure rechecked as directed by your health care provider.   Take medicines only as directed by your health care provider. Follow the directions carefully. Blood  pressure medicines must be taken as prescribed. The medicine does not work as well when you skip doses. Skipping doses also puts you at risk for problems.  Do not smoke.   Monitor your blood pressure at home as directed by your health care provider. SEEK MEDICAL CARE IF:   You think you are having a reaction to medicines taken.  You have recurrent headaches or feel dizzy.  You have swelling in your ankles.  You have trouble with your vision. SEEK IMMEDIATE MEDICAL CARE IF:  You develop a severe headache or confusion.  You have unusual weakness, numbness, or feel faint.  You have severe chest or abdominal pain.  You vomit repeatedly.  You have trouble breathing. MAKE SURE YOU:   Understand these instructions.  Will watch your condition.  Will get help right away if you are not doing well or get worse.   This information is not intended to replace advice given to you by your health care provider. Make sure you discuss any questions you have with your health care provider.   Document Released: 11/22/2005 Document Revised: 04/08/2015 Document Reviewed: 09/14/2013 Elsevier Interactive Patient Education [employer].

## 2016-04-20 LAB — TSH: TSH: 3.89 u[IU]/mL (ref 0.450–4.500)

## 2016-04-21 ENCOUNTER — Other Ambulatory Visit: Payer: Self-pay | Admitting: Family Medicine

## 2016-05-24 ENCOUNTER — Other Ambulatory Visit: Payer: Self-pay | Admitting: Family Medicine

## 2016-07-15 ENCOUNTER — Other Ambulatory Visit: Payer: Self-pay | Admitting: Family Medicine

## 2016-10-19 ENCOUNTER — Other Ambulatory Visit: Payer: Self-pay | Admitting: Family Medicine

## 2016-12-14 NOTE — Progress Notes (Signed)
Subjective:    Patient ID: Billy Nolan, male    DOB: [birth year], 71 y.o.   MRN: [id number]  HPI 71 year old gentleman with diabetes hypertension and hyperlipidemia. He has not monitored his sugars at home. Last A1c was elevated at 9.7, almost a year ago. Lipids were last assessed one year ago. He is on both a statin and fibrate; would like to discontinue 5 bright if we can get his sugars under better control  Patient Active Problem List   Diagnosis Date Noted  . Spinal stenosis, lumbar region, with neurogenic claudication 01/30/2016  . DM (diabetes mellitus) ([place]) 10/24/2013  . Hyperlipidemia 10/24/2013   Outpatient Encounter Prescriptions as of 12/15/2016  Medication Sig  . aspirin 81 MG tablet Take 81 mg by mouth 2 (two) times a week.   . Cholecalciferol 1000 UNITS capsule Take 1 capsule (1,000 Units total) by mouth daily.  . fenofibrate (TRICOR) 145 MG tablet TAKE ONE TABLET BY MOUTH ONCE DAILY  . Glucosamine-Chondroit-Vit C-Mn (GLUCOSAMINE 1500 COMPLEX PO) Take 2 capsules by mouth every evening.   Marland Kitchen losartan (COZAAR) 25 MG tablet TAKE ONE TABLET BY MOUTH ONCE DAILY  . Magnesium 400 MG CAPS Take 1 capsule by mouth at bedtime.   . metFORMIN (GLUCOPHAGE) 1000 MG tablet Take 1 tablet (1,000 mg total) by mouth 2 (two) times daily with a meal.  . niacin (NIASPAN) 1000 MG CR tablet TAKE ONE TABLET BY MOUTH ONCE DAILY WITH BREAKFAST  . rosuvastatin (CRESTOR) 40 MG tablet Take 0.5 tablets (20 mg total) by mouth daily.  . sildenafil (REVATIO) 20 MG tablet TAKE ONE TO TWO TABLETS BY MOUTH AS NEEDED (Patient taking differently: Take 20 mg by mouth as needed (erectile dystfunction). )  . [DISCONTINUED] celecoxib (CELEBREX) 100 MG capsule Take 100 mg by mouth 2 (two) times daily.  . [DISCONTINUED] niacin (NIASPAN) 1000 MG CR tablet TAKE ONE TABLET BY MOUTH ONCE DAILY WITH BREAKFAST  . [DISCONTINUED] Niacin CR 1000 MG TBCR TAKE ONE TABLET BY MOUTH ONCE DAILY WITH BREAKFAST   No  facility-administered encounter medications on file as of 12/15/2016.       Review of Systems  Constitutional: Negative.   HENT: Negative.   Respiratory: Negative.   Cardiovascular: Negative.   Genitourinary: Negative.   Neurological: Negative.        Objective:   Physical Exam  Constitutional: He is oriented to person, place, and time. He appears well-developed and well-nourished.  HENT:  Mouth/Throat: Oropharynx is clear and moist.  Cardiovascular: Normal rate, regular rhythm and normal heart sounds.   Pulmonary/Chest: Effort normal and breath sounds normal.  Neurological: He is alert and oriented to person, place, and time.  Psychiatric: He has a normal mood and affect. His behavior is normal.   BP 135/86   Pulse 99   Temp 97.2 F (36.2 C) (Oral)   Ht _0  (1.727 m)   Wt 184 lb 9.6 oz (83.7 kg)   BMI 28.07 kg/m         Assessment & Plan:  1. Type 2 diabetes mellitus without complication, without long-term current use of insulin (HCC) Continue with metformin. Renal function is okay. Stressed the importance of diet - Bayer DCA Hb A1c Waived  2. Hyperlipidemia, unspecified hyperlipidemia type L he has no problems with lipid lowering agents but I would like to try to stop the TriCor if possible DL was at goal when last assessed over a year ago. - CMP14+EGFR - Lipid panel  Wardell Honour MD

## 2016-12-15 ENCOUNTER — Encounter: Payer: Self-pay | Admitting: Family Medicine

## 2016-12-15 ENCOUNTER — Ambulatory Visit (INDEPENDENT_AMBULATORY_CARE_PROVIDER_SITE_OTHER): Payer: PPO | Admitting: Family Medicine

## 2016-12-15 VITALS — BP 135/86 | HR 99 | Temp 97.2°F | Ht 68.0 in | Wt 184.6 lb

## 2016-12-15 DIAGNOSIS — E785 Hyperlipidemia, unspecified: Secondary | ICD-10-CM | POA: Diagnosis not present

## 2016-12-15 DIAGNOSIS — E119 Type 2 diabetes mellitus without complications: Secondary | ICD-10-CM | POA: Diagnosis not present

## 2016-12-15 LAB — BAYER DCA HB A1C WAIVED: HB A1C: 8.6 % — AB (ref <7.0)

## 2016-12-16 LAB — CMP14+EGFR
A/G RATIO: 2.3 — AB (ref 1.2–2.2)
ALT: 24 IU/L (ref 0–44)
AST: 24 IU/L (ref 0–40)
Albumin: 4.6 g/dL (ref 3.6–4.8)
Alkaline Phosphatase: 57 IU/L (ref 39–117)
BILIRUBIN TOTAL: 1 mg/dL (ref 0.0–1.2)
BUN/Creatinine Ratio: 15 (ref 10–24)
BUN: 14 mg/dL (ref 8–27)
CALCIUM: 10.1 mg/dL (ref 8.6–10.2)
CHLORIDE: 100 mmol/L (ref 96–106)
CO2: 24 mmol/L (ref 18–29)
Creatinine, Ser: 0.94 mg/dL (ref 0.76–1.27)
GFR calc Af Amer: 96 mL/min/{1.73_m2} (ref >59)
GFR calc non Af Amer: 83 mL/min/{1.73_m2} (ref >59)
GLUCOSE: 179 mg/dL — AB (ref 65–99)
Globulin, Total: 2 g/dL (ref 1.5–4.5)
POTASSIUM: 4.8 mmol/L (ref 3.5–5.2)
Sodium: 142 mmol/L (ref 134–144)
Total Protein: 6.6 g/dL (ref 6.0–8.5)

## 2016-12-16 LAB — LIPID PANEL
CHOLESTEROL TOTAL: 149 mg/dL (ref 100–199)
Chol/HDL Ratio: 5.1 ratio units — ABNORMAL HIGH (ref 0.0–5.0)
HDL: 29 mg/dL — ABNORMAL LOW (ref >39)
LDL CALC: 57 mg/dL (ref 0–99)
Triglycerides: 314 mg/dL — ABNORMAL HIGH (ref 0–149)
VLDL CHOLESTEROL CAL: 63 mg/dL — AB (ref 5–40)

## 2017-01-18 ENCOUNTER — Other Ambulatory Visit: Payer: Self-pay | Admitting: Family Medicine

## 2017-01-18 DIAGNOSIS — E785 Hyperlipidemia, unspecified: Secondary | ICD-10-CM

## 2017-02-01 DIAGNOSIS — L853 Xerosis cutis: Secondary | ICD-10-CM | POA: Diagnosis not present

## 2017-02-01 DIAGNOSIS — D234 Other benign neoplasm of skin of scalp and neck: Secondary | ICD-10-CM | POA: Diagnosis not present

## 2017-02-01 DIAGNOSIS — D1800 Hemangioma unspecified site: Secondary | ICD-10-CM | POA: Diagnosis not present

## 2017-02-17 DIAGNOSIS — D171 Benign lipomatous neoplasm of skin and subcutaneous tissue of trunk: Secondary | ICD-10-CM | POA: Diagnosis not present

## 2017-02-17 DIAGNOSIS — D485 Neoplasm of uncertain behavior of skin: Secondary | ICD-10-CM | POA: Diagnosis not present

## 2017-03-22 ENCOUNTER — Other Ambulatory Visit: Payer: Self-pay | Admitting: Family Medicine

## 2017-05-17 ENCOUNTER — Other Ambulatory Visit: Payer: Self-pay | Admitting: Family Medicine

## 2017-05-18 NOTE — Telephone Encounter (Signed)
Scheduled for recheck .

## 2017-05-18 NOTE — Telephone Encounter (Signed)
Authorize 30 days only. Then contact the patient letting them know that they will need an appointment before any further prescriptions can be sent in. 

## 2017-05-19 ENCOUNTER — Ambulatory Visit (INDEPENDENT_AMBULATORY_CARE_PROVIDER_SITE_OTHER): Payer: PPO | Admitting: Family Medicine

## 2017-05-19 ENCOUNTER — Encounter: Payer: Self-pay | Admitting: Family Medicine

## 2017-05-19 VITALS — BP 132/74 | HR 85 | Ht 68.0 in | Wt 181.0 lb

## 2017-05-19 DIAGNOSIS — E119 Type 2 diabetes mellitus without complications: Secondary | ICD-10-CM | POA: Diagnosis not present

## 2017-05-19 DIAGNOSIS — E785 Hyperlipidemia, unspecified: Secondary | ICD-10-CM | POA: Diagnosis not present

## 2017-05-19 LAB — CBC WITH DIFFERENTIAL/PLATELET
BASOS: 1 %
Basophils Absolute: 0 10*3/uL (ref 0.0–0.2)
EOS (ABSOLUTE): 0.2 10*3/uL (ref 0.0–0.4)
EOS: 3 %
HEMATOCRIT: 44.4 % (ref 37.5–51.0)
Hemoglobin: 15 g/dL (ref 13.0–17.7)
IMMATURE GRANULOCYTES: 0 %
Immature Grans (Abs): 0 10*3/uL (ref 0.0–0.1)
LYMPHS: 37 %
Lymphocytes Absolute: 2 10*3/uL (ref 0.7–3.1)
MCH: 31.4 pg (ref 26.6–33.0)
MCHC: 33.8 g/dL (ref 31.5–35.7)
MCV: 93 fL (ref 79–97)
Monocytes Absolute: 0.4 10*3/uL (ref 0.1–0.9)
Monocytes: 8 %
NEUTROS ABS: 2.7 10*3/uL (ref 1.4–7.0)
NEUTROS PCT: 51 %
PLATELETS: 201 10*3/uL (ref 150–379)
RBC: 4.78 x10E6/uL (ref 4.14–5.80)
RDW: 13.4 % (ref 12.3–15.4)
WBC: 5.3 10*3/uL (ref 3.4–10.8)

## 2017-05-19 LAB — URINALYSIS
BILIRUBIN UA: NEGATIVE
GLUCOSE, UA: NEGATIVE
Ketones, UA: NEGATIVE
Leukocytes, UA: NEGATIVE
NITRITE UA: NEGATIVE
Protein, UA: NEGATIVE
RBC UA: NEGATIVE
SPEC GRAV UA: 1.02 (ref 1.005–1.030)
UUROB: 0.2 mg/dL (ref 0.2–1.0)
pH, UA: 6 (ref 5.0–7.5)

## 2017-05-19 LAB — CMP14+EGFR
A/G RATIO: 2.1 (ref 1.2–2.2)
ALT: 26 IU/L (ref 0–44)
AST: 21 IU/L (ref 0–40)
Albumin: 4.5 g/dL (ref 3.6–4.8)
Alkaline Phosphatase: 48 IU/L (ref 39–117)
BUN/Creatinine Ratio: 20 (ref 10–24)
BUN: 18 mg/dL (ref 8–27)
Bilirubin Total: 1.1 mg/dL (ref 0.0–1.2)
CALCIUM: 10.1 mg/dL (ref 8.6–10.2)
CO2: 23 mmol/L (ref 20–29)
CREATININE: 0.9 mg/dL (ref 0.76–1.27)
Chloride: 100 mmol/L (ref 96–106)
GFR, EST AFRICAN AMERICAN: 100 mL/min/{1.73_m2} (ref >59)
GFR, EST NON AFRICAN AMERICAN: 87 mL/min/{1.73_m2} (ref >59)
GLOBULIN, TOTAL: 2.1 g/dL (ref 1.5–4.5)
Glucose: 157 mg/dL — ABNORMAL HIGH (ref 65–99)
POTASSIUM: 4.6 mmol/L (ref 3.5–5.2)
SODIUM: 140 mmol/L (ref 134–144)
TOTAL PROTEIN: 6.6 g/dL (ref 6.0–8.5)

## 2017-05-19 LAB — LIPID PANEL
CHOLESTEROL TOTAL: 120 mg/dL (ref 100–199)
Chol/HDL Ratio: 3.9 ratio (ref 0.0–5.0)
HDL: 31 mg/dL — ABNORMAL LOW (ref >39)
LDL Calculated: 43 mg/dL (ref 0–99)
TRIGLYCERIDES: 231 mg/dL — AB (ref 0–149)
VLDL Cholesterol Cal: 46 mg/dL — ABNORMAL HIGH (ref 5–40)

## 2017-05-19 LAB — BAYER DCA HB A1C WAIVED: HB A1C: 7.3 % — AB (ref <7.0)

## 2017-05-19 MED ORDER — SILDENAFIL CITRATE 20 MG PO TABS: ORAL_TABLET | ORAL | 5 refills | Status: DC

## 2017-05-19 NOTE — Patient Instructions (Signed)

## 2017-05-19 NOTE — Progress Notes (Signed)
Subjective:  Patient ID: Billy Nolan, male    DOB: 25-Nov-1946  Age: 70 y.o. MRN: [id number]  CC: Diabetes (pt here today for routine follow up of his chronic medical conditions, no other concerns voiced)   HPI Billy Nolan presents forFollow-up of diabetes. Patient checks blood sugar at home.   1:30 fasting and 160 postprandial Patient denies symptoms such as polyuria, polydipsia, excessive hunger, nausea No significant hypoglycemic spells noted. Medications reviewed. Pt reports taking them regularly without complication/adverse reaction being reported today.  Checking feet daily. Last eye appt was over one year ago  History Billy Nolan has a past medical history of Allergy; Diabetes mellitus without complication ([place]); GERD (gastroesophageal reflux disease); Hyperlipidemia; and Hypertension.   He has a past surgical history that includes dental surgeries and Lumbar laminectomy/decompression microdiscectomy (Left, 01/30/2016).   His family history includes Heart disease in his mother; Multiple sclerosis in his sister; Stroke in his father.He reports that he has never smoked. He has never used smokeless tobacco. He reports that he does not drink alcohol or use drugs.  Current Outpatient Prescriptions on File Prior to Visit  Medication Sig Dispense Refill  . aspirin 81 MG tablet Take 81 mg by mouth 2 (two) times a week.     . Cholecalciferol 1000 UNITS capsule Take 1 capsule (1,000 Units total) by mouth daily. 90 capsule 3  . fenofibrate (TRICOR) 145 MG tablet TAKE ONE TABLET BY MOUTH ONCE DAILY 90 tablet 3  . Glucosamine-Chondroit-Vit C-Mn (GLUCOSAMINE 1500 COMPLEX PO) Take 2 capsules by mouth every evening.     Marland Kitchen losartan (COZAAR) 25 MG tablet TAKE ONE TABLET BY MOUTH ONCE DAILY 30 tablet 2  . Magnesium 400 MG CAPS Take 1 capsule by mouth at bedtime.     . metFORMIN (GLUCOPHAGE) 1000 MG tablet Take 1 tablet (1,000 mg total) by mouth 2 (two) times daily with a meal. 60 tablet 0  . niacin  (NIASPAN) 1000 MG CR tablet TAKE ONE TABLET BY MOUTH ONCE DAILY WITH BREAKFAST 90 tablet 1  . rosuvastatin (CRESTOR) 40 MG tablet TAKE ONE-HALF TABLET BY MOUTH ONCE DAILY 45 tablet 1   No current facility-administered medications on file prior to visit.     ROS Review of Systems  Constitutional: Negative for chills, diaphoresis, fever and unexpected weight change.  HENT: Negative for congestion, hearing loss, rhinorrhea and sore throat.   Eyes: Negative for visual disturbance.  Respiratory: Negative for cough and shortness of breath.   Cardiovascular: Negative for chest pain.  Gastrointestinal: Negative for abdominal pain, constipation and diarrhea.  Genitourinary: Negative for dysuria and flank pain.  Musculoskeletal: Negative for arthralgias and joint swelling.  Skin: Negative for rash.  Neurological: Negative for dizziness and headaches.  Psychiatric/Behavioral: Negative for dysphoric mood and sleep disturbance.    Objective:  BP 132/74   Pulse 85   Ht 5' 8"  (1.727 m)   Wt 181 lb (82.1 kg)   BMI 27.52 kg/m   BP Readings from Last 3 Encounters:  05/19/17 132/74  12/15/16 135/86  04/19/16 105/72    Wt Readings from Last 3 Encounters:  05/19/17 181 lb (82.1 kg)  12/15/16 184 lb 9.6 oz (83.7 kg)  04/19/16 182 lb (82.6 kg)     Physical Exam  Constitutional: He is oriented to person, place, and time. He appears well-developed and well-nourished. No distress.  HENT:  Head: Normocephalic and atraumatic.  Right Ear: External ear normal.  Left Ear: External ear normal.  Nose: Nose normal.  Mouth/Throat: Oropharynx  is clear and moist.  Eyes: Conjunctivae and EOM are normal. Pupils are equal, round, and reactive to light.  Neck: Normal range of motion. Neck supple. No thyromegaly present.  Cardiovascular: Normal rate, regular rhythm and normal heart sounds.   No murmur heard. Pulmonary/Chest: Effort normal and breath sounds normal. No respiratory distress. He has no  wheezes. He has no rales.  Abdominal: Soft. Bowel sounds are normal. He exhibits no distension. There is no tenderness.  Lymphadenopathy:    He has no cervical adenopathy.  Neurological: He is alert and oriented to person, place, and time. He has normal reflexes.  Skin: Skin is warm and dry.  Psychiatric: He has a normal mood and affect. His behavior is normal. Judgment and thought content normal.    Diabetic Foot Exam - Simple   Simple Foot Form Diabetic Foot exam was performed with the following findings:  Yes 05/19/2017  9:04 AM  Visual Inspection No deformities, no ulcerations, no other skin breakdown bilaterally:  Yes Sensation Testing Intact to touch and monofilament testing bilaterally:  Yes Pulse Check Posterior Tibialis and Dorsalis pulse intact bilaterally:  Yes Comments        Assessment & Plan:   Billy Nolan was seen today for diabetes.  Diagnoses and all orders for this visit:  Type 2 diabetes mellitus without complication, without long-term current use of insulin (HCC) -     Microalbumin / creatinine urine ratio -     Urinalysis -     Bayer DCA Hb A1c Waived -     Lipid panel -     CMP14+EGFR -     CBC with Differential/Platelet  Hyperlipidemia, unspecified hyperlipidemia type -     Lipid panel  Other orders -     sildenafil (REVATIO) 20 MG tablet; Take 2 to 5 daily as needed for sex      I have discontinued Billy Nolan celecoxib. I have also changed his sildenafil. Additionally, I am having him maintain his Glucosamine-Chondroit-Vit C-Mn (GLUCOSAMINE 1500 COMPLEX PO), Magnesium, Cholecalciferol, aspirin, fenofibrate, niacin, rosuvastatin, losartan, and metFORMIN.  Meds ordered this encounter  Medications  . sildenafil (REVATIO) 20 MG tablet    Sig: Take 2 to 5 daily as needed for sex    Dispense:  60 tablet    Refill:  5     Follow-up: Return in about 3 months (around 08/19/2017).  [name], M.D.

## 2017-05-20 LAB — MICROALBUMIN / CREATININE URINE RATIO
Creatinine, Urine: 155.5 mg/dL
MICROALB/CREAT RATIO: 4.8 mg/g{creat} (ref 0.0–30.0)
MICROALBUM., U, RANDOM: 7.4 ug/mL

## 2017-06-16 ENCOUNTER — Other Ambulatory Visit: Payer: Self-pay | Admitting: Family Medicine

## 2017-07-13 ENCOUNTER — Other Ambulatory Visit: Payer: Self-pay | Admitting: Family Medicine

## 2017-08-12 ENCOUNTER — Other Ambulatory Visit: Payer: Self-pay | Admitting: Family Medicine

## 2017-08-12 DIAGNOSIS — E785 Hyperlipidemia, unspecified: Secondary | ICD-10-CM

## 2017-08-19 ENCOUNTER — Encounter: Payer: Self-pay | Admitting: Family Medicine

## 2017-08-19 ENCOUNTER — Ambulatory Visit (INDEPENDENT_AMBULATORY_CARE_PROVIDER_SITE_OTHER): Payer: PPO | Admitting: Family Medicine

## 2017-08-19 VITALS — BP 123/81 | HR 96 | Temp 97.3°F | Ht 68.0 in | Wt 182.0 lb

## 2017-08-19 DIAGNOSIS — E119 Type 2 diabetes mellitus without complications: Secondary | ICD-10-CM | POA: Diagnosis not present

## 2017-08-19 DIAGNOSIS — M48062 Spinal stenosis, lumbar region with neurogenic claudication: Secondary | ICD-10-CM | POA: Diagnosis not present

## 2017-08-19 DIAGNOSIS — E785 Hyperlipidemia, unspecified: Secondary | ICD-10-CM

## 2017-08-19 LAB — BAYER DCA HB A1C WAIVED: HB A1C (BAYER DCA - WAIVED): 7.8 % — ABNORMAL HIGH (ref <7.0)

## 2017-08-19 MED ORDER — LOSARTAN POTASSIUM 25 MG PO TABS: 25.0000 mg | ORAL_TABLET | Freq: Every day | ORAL | 1 refills | Status: DC

## 2017-08-19 MED ORDER — ROSUVASTATIN CALCIUM 40 MG PO TABS: ORAL_TABLET | ORAL | 1 refills | Status: DC

## 2017-08-19 MED ORDER — METFORMIN HCL 1000 MG PO TABS: 1000.0000 mg | ORAL_TABLET | Freq: Two times a day (BID) | ORAL | 1 refills | Status: DC

## 2017-08-19 MED ORDER — SILDENAFIL CITRATE 20 MG PO TABS: ORAL_TABLET | ORAL | 5 refills | Status: DC

## 2017-08-19 MED ORDER — FLUTICASONE PROPIONATE 50 MCG/ACT NA SUSP: 2.0000 | Freq: Every day | NASAL | 6 refills | Status: DC

## 2017-08-19 MED ORDER — FENOFIBRATE 145 MG PO TABS: 145.0000 mg | ORAL_TABLET | Freq: Every day | ORAL | 3 refills | Status: DC

## 2017-08-19 MED ORDER — BLOOD GLUCOSE MONITOR SYSTEM W/DEVICE KIT: 1.0000 | PACK | Freq: Two times a day (BID) | 0 refills | Status: DC

## 2017-08-19 NOTE — Progress Notes (Signed)
Subjective:  Patient ID: Billy Nolan,  male    DOB: 03/11/1946  Age: 71 y.o.    CC: Diabetes (pt here today for routine follow up of his chronic medical conditions)   HPI Billy Nolan presents for  follow-up of elevated cholesterol. Doing well without complaints on current medication. Denies side effects of statin including myalgia and arthralgia and nausea. Also in today for liver function testing. Currently no chest pain, shortness of breath or other cardiovascular related symptoms noted.  Follow-up of diabetes. Patient does check blood sugar at home. Readings run between 100 and 180 Patient denies symptoms such as polyuria, polydipsia, excessive hunger, nausea No significant hypoglycemic spells noted. Medications reviewed. Pt reports taking them regularly. Pt. denies complication/adverse reaction today.    History Billy Nolan has a past medical history of Allergy; Diabetes mellitus without complication ([place]); GERD (gastroesophageal reflux disease); Hyperlipidemia; and Hypertension.   He has a past surgical history that includes dental surgeries and Lumbar laminectomy/decompression microdiscectomy (Left, 01/30/2016).   His family history includes Heart disease in his mother; Multiple sclerosis in his sister; Stroke in his father.He reports that he has never smoked. He has never used smokeless tobacco. He reports that he does not drink alcohol or use drugs.  Current Outpatient Prescriptions on File Prior to Visit  Medication Sig Dispense Refill  . aspirin 81 MG tablet Take 81 mg by mouth 2 (two) times a week.     . Cholecalciferol 1000 UNITS capsule Take 1 capsule (1,000 Units total) by mouth daily. 90 capsule 3  . Glucosamine-Chondroit-Vit C-Mn (GLUCOSAMINE 1500 COMPLEX PO) Take 2 capsules by mouth every evening.     . Magnesium 400 MG CAPS Take 1 capsule by mouth at bedtime.     . niacin (NIASPAN) 1000 MG CR tablet TAKE 1 TABLET BY MOUTH ONCE DAILY WITH BREAKFAST 90 tablet 1   No  current facility-administered medications on file prior to visit.     ROS Review of Systems  Constitutional: Negative for chills, diaphoresis, fever and unexpected weight change.  HENT: Negative for congestion, hearing loss, rhinorrhea and sore throat.   Eyes: Negative for visual disturbance.  Respiratory: Negative for cough and shortness of breath.   Cardiovascular: Negative for chest pain.  Gastrointestinal: Negative for abdominal pain, constipation and diarrhea.  Genitourinary: Negative for dysuria and flank pain.  Musculoskeletal: Negative for arthralgias and joint swelling.  Skin: Negative for rash.  Neurological: Negative for dizziness and headaches.  Psychiatric/Behavioral: Negative for dysphoric mood and sleep disturbance.    Objective:  BP 123/81   Pulse 96   Temp (!) 97.3 F (36.3 C) (Oral)   Ht 5' 8"  (1.727 m)   Wt 182 lb (82.6 kg)   BMI 27.67 kg/m   BP Readings from Last 3 Encounters:  08/19/17 123/81  05/19/17 132/74  12/15/16 135/86    Wt Readings from Last 3 Encounters:  08/19/17 182 lb (82.6 kg)  05/19/17 181 lb (82.1 kg)  12/15/16 184 lb 9.6 oz (83.7 kg)     Physical Exam  Constitutional: He is oriented to person, place, and time. He appears well-developed and well-nourished. No distress.  HENT:  Head: Normocephalic and atraumatic.  Right Ear: External ear normal.  Left Ear: External ear normal.  Nose: Nose normal.  Mouth/Throat: Oropharynx is clear and moist.  Eyes: Pupils are equal, round, and reactive to light. Conjunctivae and EOM are normal.  Neck: Normal range of motion. Neck supple. No thyromegaly present.  Cardiovascular: Normal rate, regular rhythm  and normal heart sounds.   No murmur heard. Pulmonary/Chest: Effort normal and breath sounds normal. No respiratory distress. He has no wheezes. He has no rales.  Abdominal: Soft. Bowel sounds are normal. He exhibits no distension. There is no tenderness.  Lymphadenopathy:    He has no  cervical adenopathy.  Neurological: He is alert and oriented to person, place, and time. He has normal reflexes.  Skin: Skin is warm and dry.  Psychiatric: He has a normal mood and affect. His behavior is normal. Judgment and thought content normal.     Assessment & Plan:   Billy Nolan was seen today for diabetes.  Diagnoses and all orders for this visit:  Type 2 diabetes mellitus without complication, without long-term current use of insulin (HCC) -     CMP14+EGFR -     Bayer DCA Hb A1c Waived  Hyperlipidemia, unspecified hyperlipidemia type -     rosuvastatin (CRESTOR) 40 MG tablet; TAKE 1/2 (ONE-HALF) TABLET BY MOUTH ONCE DAILY  Spinal stenosis, lumbar region, with neurogenic claudication  Other orders -     Blood Glucose Monitoring Suppl (BLOOD GLUCOSE MONITOR SYSTEM) w/Device KIT; 1 Device by Does not apply route 2 (two) times daily. -     fluticasone (FLONASE) 50 MCG/ACT nasal spray; Place 2 sprays into both nostrils daily. -     fenofibrate (TRICOR) 145 MG tablet; Take 1 tablet (145 mg total) by mouth daily. -     losartan (COZAAR) 25 MG tablet; Take 1 tablet (25 mg total) by mouth daily. -     metFORMIN (GLUCOPHAGE) 1000 MG tablet; Take 1 tablet (1,000 mg total) by mouth 2 (two) times daily with a meal. -     sildenafil (REVATIO) 20 MG tablet; Take 2 to 5 daily as needed for sex   I have changed Billy Nolan fenofibrate, losartan, and metFORMIN. I am also having him start on Blood Glucose Monitor System and fluticasone. Additionally, I am having him maintain his Glucosamine-Chondroit-Vit C-Mn (GLUCOSAMINE 1500 COMPLEX PO), Magnesium, Cholecalciferol, aspirin, niacin, rosuvastatin, and sildenafil.  Meds ordered this encounter  Medications  . Blood Glucose Monitoring Suppl (BLOOD GLUCOSE MONITOR SYSTEM) w/Device KIT    Sig: 1 Device by Does not apply route 2 (two) times daily.    Dispense:  1 each    Refill:  0  . fluticasone (FLONASE) 50 MCG/ACT nasal spray    Sig: Place 2  sprays into both nostrils daily.    Dispense:  16 g    Refill:  6  . rosuvastatin (CRESTOR) 40 MG tablet    Sig: TAKE 1/2 (ONE-HALF) TABLET BY MOUTH ONCE DAILY    Dispense:  45 tablet    Refill:  1  . fenofibrate (TRICOR) 145 MG tablet    Sig: Take 1 tablet (145 mg total) by mouth daily.    Dispense:  90 tablet    Refill:  3  . losartan (COZAAR) 25 MG tablet    Sig: Take 1 tablet (25 mg total) by mouth daily.    Dispense:  90 tablet    Refill:  1    Please consider 90 day supplies to promote better adherence  . metFORMIN (GLUCOPHAGE) 1000 MG tablet    Sig: Take 1 tablet (1,000 mg total) by mouth 2 (two) times daily with a meal.    Dispense:  180 tablet    Refill:  1    Please consider 90 day supplies to promote better adherence  . sildenafil (REVATIO) 20 MG tablet  Sig: Take 2 to 5 daily as needed for sex    Dispense:  60 tablet    Refill:  5     Follow-up: Return in about 3 months (around 11/18/2017).  [name], M.D.

## 2017-08-20 LAB — CMP14+EGFR
A/G RATIO: 2.2 (ref 1.2–2.2)
ALBUMIN: 4.7 g/dL (ref 3.6–4.8)
ALK PHOS: 54 IU/L (ref 39–117)
ALT: 22 IU/L (ref 0–44)
AST: 19 IU/L (ref 0–40)
BILIRUBIN TOTAL: 0.8 mg/dL (ref 0.0–1.2)
BUN/Creatinine Ratio: 15 (ref 10–24)
BUN: 14 mg/dL (ref 8–27)
CALCIUM: 10.5 mg/dL — AB (ref 8.6–10.2)
CHLORIDE: 101 mmol/L (ref 96–106)
CO2: 25 mmol/L (ref 20–29)
CREATININE: 0.95 mg/dL (ref 0.76–1.27)
GFR calc Af Amer: 94 mL/min/{1.73_m2} (ref >59)
GFR calc non Af Amer: 81 mL/min/{1.73_m2} (ref >59)
GLUCOSE: 171 mg/dL — AB (ref 65–99)
Globulin, Total: 2.1 g/dL (ref 1.5–4.5)
POTASSIUM: 5.5 mmol/L — AB (ref 3.5–5.2)
Sodium: 141 mmol/L (ref 134–144)
TOTAL PROTEIN: 6.8 g/dL (ref 6.0–8.5)

## 2017-08-22 ENCOUNTER — Telehealth: Payer: Self-pay | Admitting: Family Medicine

## 2017-08-22 NOTE — Telephone Encounter (Signed)
Spoke with patient, aware of labs

## 2017-10-14 ENCOUNTER — Other Ambulatory Visit: Payer: Self-pay | Admitting: Family Medicine

## 2017-11-21 ENCOUNTER — Encounter: Payer: Self-pay | Admitting: Family Medicine

## 2017-11-21 ENCOUNTER — Ambulatory Visit: Payer: PPO | Admitting: Family Medicine

## 2017-11-21 VITALS — BP 130/80 | HR 101 | Temp 97.1°F | Ht 68.0 in | Wt 182.0 lb

## 2017-11-21 DIAGNOSIS — E785 Hyperlipidemia, unspecified: Secondary | ICD-10-CM | POA: Diagnosis not present

## 2017-11-21 DIAGNOSIS — Z125 Encounter for screening for malignant neoplasm of prostate: Secondary | ICD-10-CM

## 2017-11-21 DIAGNOSIS — E119 Type 2 diabetes mellitus without complications: Secondary | ICD-10-CM

## 2017-11-21 LAB — BAYER DCA HB A1C WAIVED: HB A1C (BAYER DCA - WAIVED): 7.4 % — ABNORMAL HIGH (ref <7.0)

## 2017-11-21 MED ORDER — ROSUVASTATIN CALCIUM 40 MG PO TABS: ORAL_TABLET | ORAL | 1 refills | Status: DC

## 2017-11-21 MED ORDER — METFORMIN HCL 1000 MG PO TABS: 1000.0000 mg | ORAL_TABLET | Freq: Two times a day (BID) | ORAL | 1 refills | Status: DC

## 2017-11-21 MED ORDER — SILDENAFIL CITRATE 20 MG PO TABS: ORAL_TABLET | ORAL | 5 refills | Status: DC

## 2017-11-21 MED ORDER — NIACIN ER (ANTIHYPERLIPIDEMIC) 1000 MG PO TBCR: EXTENDED_RELEASE_TABLET | ORAL | 1 refills | Status: DC

## 2017-11-21 MED ORDER — GLUCOSE BLOOD VI STRP: ORAL_STRIP | 2 refills | Status: DC

## 2017-11-21 MED ORDER — LOSARTAN POTASSIUM 25 MG PO TABS: 25.0000 mg | ORAL_TABLET | Freq: Every day | ORAL | 1 refills | Status: DC

## 2017-11-21 NOTE — Progress Notes (Signed)
Subjective:  Patient ID: Billy Nolan,  male    DOB: [birth year]  Age: 71 y.o.    CC: Diabetes (pt here today for routine follow up of his chronic medical conditions, no other concerns voiced.)   HPI [name] presents for  follow-up ofelevated cholesterol. Doing well without complaints on current medication. Denies side effects of statin including myalgia and arthralgia and nausea. Also in today for liver function testing. Currently no chest pain, shortness of breath or other cardiovascular related symptoms noted.  Follow-up of diabetes. Patient does check blood sugar at home. Readings run between 150 and 180 Patient denies symptoms such as polyuria, polydipsia, excessive hunger, nausea No significant hypoglycemic spells noted. Medications reviewed. Pt reports taking them regularly. Pt. denies complication/adverse reaction today.    History [name] has a past medical history of Allergy, Diabetes mellitus without complication ([place]), GERD (gastroesophageal reflux disease), Hyperlipidemia, and Hypertension.   He has a past surgical history that includes dental surgeries and Lumbar laminectomy/decompression microdiscectomy (Left, 01/30/2016).   His family history includes Heart disease in his mother; Multiple sclerosis in his sister; Stroke in his father.He reports that  has never smoked. he has never used smokeless tobacco. He reports that he does not drink alcohol or use drugs.  Current Outpatient Medications on File Prior to Visit  Medication Sig Dispense Refill  . aspirin 81 MG tablet Take 81 mg by mouth 2 (two) times a week.     . Cholecalciferol 1000 UNITS capsule Take 1 capsule (1,000 Units total) by mouth daily. 90 capsule 3  . fenofibrate (TRICOR) 145 MG tablet Take 1 tablet (145 mg total) by mouth daily. 90 tablet 3  . fluticasone (FLONASE) 50 MCG/ACT nasal spray Place 2 sprays into both nostrils daily. 16 g 6  . Glucosamine-Chondroit-Vit C-Mn (GLUCOSAMINE 1500 COMPLEX PO) Take  2 capsules by mouth every evening.     . Magnesium 400 MG CAPS Take 1 capsule by mouth at bedtime.      No current facility-administered medications on file prior to visit.     ROS Review of Systems  Constitutional: Negative for chills, diaphoresis, fever and unexpected weight change.  HENT: Negative for congestion, hearing loss, rhinorrhea and sore throat.   Eyes: Negative for visual disturbance.  Respiratory: Negative for cough and shortness of breath.   Cardiovascular: Negative for chest pain.  Gastrointestinal: Negative for abdominal pain, constipation and diarrhea.  Genitourinary: Negative for dysuria and flank pain.  Musculoskeletal: Negative for arthralgias and joint swelling.  Skin: Negative for rash.  Neurological: Negative for dizziness and headaches.  Psychiatric/Behavioral: Negative for dysphoric mood and sleep disturbance.    Objective:  BP 130/80   Pulse (!) 101   Temp (!) 97.1 F (36.2 C) (Oral)   Ht 5' 8"  (1.727 m)   Wt 182 lb (82.6 kg)   BMI 27.67 kg/m   BP Readings from Last 3 Encounters:  11/21/17 130/80  08/19/17 123/81  05/19/17 132/74    Wt Readings from Last 3 Encounters:  11/21/17 182 lb (82.6 kg)  08/19/17 182 lb (82.6 kg)  05/19/17 181 lb (82.1 kg)     Physical Exam  Constitutional: He is oriented to person, place, and time. He appears well-developed and well-nourished. No distress.  HENT:  Head: Normocephalic and atraumatic.  Right Ear: External ear normal.  Left Ear: External ear normal.  Nose: Nose normal.  Mouth/Throat: Oropharynx is clear and moist.  Eyes: Conjunctivae and EOM are normal. Pupils are equal, round, and reactive  to light.  Neck: Normal range of motion. Neck supple. No thyromegaly present.  Cardiovascular: Normal rate, regular rhythm and normal heart sounds.  No murmur heard. Pulmonary/Chest: Effort normal and breath sounds normal. No respiratory distress. He has no wheezes. He has no rales.  Abdominal: Soft. Bowel  sounds are normal. He exhibits no distension. There is no tenderness.  Lymphadenopathy:    He has no cervical adenopathy.  Neurological: He is alert and oriented to person, place, and time. He has normal reflexes.  Skin: Skin is warm and dry.  Psychiatric: He has a normal mood and affect. His behavior is normal. Judgment and thought content normal.        Assessment & Plan:   Ottie was seen today for diabetes.  Diagnoses and all orders for this visit:  Type 2 diabetes mellitus without complication, without long-term current use of insulin (HCC) -     Bayer DCA Hb A1c Waived  Hyperlipidemia, unspecified hyperlipidemia type -     CBC with Differential/Platelet -     CMP14+EGFR -     Lipid panel -     rosuvastatin (CRESTOR) 40 MG tablet; TAKE 1/2 (ONE-HALF) TABLET BY MOUTH ONCE DAILY  Special screening for malignant neoplasm of prostate -     PSA, total and free  Other orders -     losartan (COZAAR) 25 MG tablet; Take 1 tablet (25 mg total) by mouth daily. -     metFORMIN (GLUCOPHAGE) 1000 MG tablet; Take 1 tablet (1,000 mg total) by mouth 2 (two) times daily with a meal. -     niacin (NIASPAN) 1000 MG CR tablet; TAKE 1 TABLET BY MOUTH ONCE DAILY WITH BREAKFAST -     glucose blood (ONETOUCH VERIO) test strip; USE 1 STRIP TO CHECK GLUCOSE TWICE DAILY -     sildenafil (REVATIO) 20 MG tablet; Take 2 to 5 daily as needed for sex   I have changed Nickolaos Pancake's ONETOUCH VERIO to glucose blood. I am also having him maintain his Glucosamine-Chondroit-Vit C-Mn (GLUCOSAMINE 1500 COMPLEX PO), Magnesium, Cholecalciferol, aspirin, fluticasone, fenofibrate, rosuvastatin, losartan, metFORMIN, niacin, and sildenafil.  Meds ordered this encounter  Medications  . rosuvastatin (CRESTOR) 40 MG tablet    Sig: TAKE 1/2 (ONE-HALF) TABLET BY MOUTH ONCE DAILY    Dispense:  45 tablet    Refill:  1  . losartan (COZAAR) 25 MG tablet    Sig: Take 1 tablet (25 mg total) by mouth daily.    Dispense:   90 tablet    Refill:  1    Please consider 90 day supplies to promote better adherence  . metFORMIN (GLUCOPHAGE) 1000 MG tablet    Sig: Take 1 tablet (1,000 mg total) by mouth 2 (two) times daily with a meal.    Dispense:  180 tablet    Refill:  1    Please consider 90 day supplies to promote better adherence  . niacin (NIASPAN) 1000 MG CR tablet    Sig: TAKE 1 TABLET BY MOUTH ONCE DAILY WITH BREAKFAST    Dispense:  90 tablet    Refill:  1  . glucose blood (ONETOUCH VERIO) test strip    Sig: USE 1 STRIP TO CHECK GLUCOSE TWICE DAILY    Dispense:  300 each    Refill:  2    Dx E11.9  . sildenafil (REVATIO) 20 MG tablet    Sig: Take 2 to 5 daily as needed for sex    Dispense:  60 tablet  Refill:  5     Follow-up: Return in about 3 months (around 02/19/2018).  [name], M.D.

## 2017-11-22 LAB — CMP14+EGFR
A/G RATIO: 3.1 — AB (ref 1.2–2.2)
ALT: 23 IU/L (ref 0–44)
AST: 16 IU/L (ref 0–40)
Albumin: 4.9 g/dL — ABNORMAL HIGH (ref 3.6–4.8)
Alkaline Phosphatase: 46 IU/L (ref 39–117)
BILIRUBIN TOTAL: 0.7 mg/dL (ref 0.0–1.2)
BUN/Creatinine Ratio: 12 (ref 10–24)
BUN: 12 mg/dL (ref 8–27)
CHLORIDE: 104 mmol/L (ref 96–106)
CO2: 23 mmol/L (ref 20–29)
Calcium: 9.9 mg/dL (ref 8.6–10.2)
Creatinine, Ser: 0.97 mg/dL (ref 0.76–1.27)
GFR calc Af Amer: 92 mL/min/{1.73_m2} (ref >59)
GFR calc non Af Amer: 79 mL/min/{1.73_m2} (ref >59)
Globulin, Total: 1.6 g/dL (ref 1.5–4.5)
Glucose: 147 mg/dL — ABNORMAL HIGH (ref 65–99)
POTASSIUM: 4.5 mmol/L (ref 3.5–5.2)
SODIUM: 142 mmol/L (ref 134–144)
Total Protein: 6.5 g/dL (ref 6.0–8.5)

## 2017-11-22 LAB — CBC WITH DIFFERENTIAL/PLATELET
BASOS: 0 %
Basophils Absolute: 0 10*3/uL (ref 0.0–0.2)
EOS (ABSOLUTE): 0.2 10*3/uL (ref 0.0–0.4)
Eos: 4 %
Hematocrit: 44.1 % (ref 37.5–51.0)
Hemoglobin: 14.3 g/dL (ref 13.0–17.7)
Immature Grans (Abs): 0 10*3/uL (ref 0.0–0.1)
Immature Granulocytes: 0 %
LYMPHS ABS: 1.9 10*3/uL (ref 0.7–3.1)
Lymphs: 29 %
MCH: 30.8 pg (ref 26.6–33.0)
MCHC: 32.4 g/dL (ref 31.5–35.7)
MCV: 95 fL (ref 79–97)
MONOS ABS: 0.5 10*3/uL (ref 0.1–0.9)
Monocytes: 8 %
NEUTROS ABS: 3.9 10*3/uL (ref 1.4–7.0)
NEUTROS PCT: 59 %
PLATELETS: 231 10*3/uL (ref 150–379)
RBC: 4.64 x10E6/uL (ref 4.14–5.80)
RDW: 13.5 % (ref 12.3–15.4)
WBC: 6.5 10*3/uL (ref 3.4–10.8)

## 2017-11-22 LAB — LIPID PANEL
Chol/HDL Ratio: 3.8 ratio (ref 0.0–5.0)
Cholesterol, Total: 122 mg/dL (ref 100–199)
HDL: 32 mg/dL — AB (ref >39)
LDL Calculated: 53 mg/dL (ref 0–99)
TRIGLYCERIDES: 187 mg/dL — AB (ref 0–149)
VLDL Cholesterol Cal: 37 mg/dL (ref 5–40)

## 2017-11-22 LAB — PSA, TOTAL AND FREE
PROSTATE SPECIFIC AG, SERUM: 0.5 ng/mL (ref 0.0–4.0)
PSA FREE PCT: 30 %
PSA, Free: 0.15 ng/mL

## 2018-02-21 ENCOUNTER — Ambulatory Visit: Payer: PPO | Admitting: Family Medicine

## 2018-03-03 ENCOUNTER — Encounter: Payer: Self-pay | Admitting: Family Medicine

## 2018-03-03 ENCOUNTER — Telehealth: Payer: Self-pay

## 2018-03-03 ENCOUNTER — Ambulatory Visit (INDEPENDENT_AMBULATORY_CARE_PROVIDER_SITE_OTHER): Payer: PPO | Admitting: Family Medicine

## 2018-03-03 VITALS — BP 113/78 | HR 103 | Temp 98.7°F | Ht 68.0 in | Wt 179.1 lb

## 2018-03-03 DIAGNOSIS — E785 Hyperlipidemia, unspecified: Secondary | ICD-10-CM

## 2018-03-03 DIAGNOSIS — Z23 Encounter for immunization: Secondary | ICD-10-CM | POA: Diagnosis not present

## 2018-03-03 DIAGNOSIS — E119 Type 2 diabetes mellitus without complications: Secondary | ICD-10-CM

## 2018-03-03 LAB — CBC WITH DIFFERENTIAL/PLATELET
BASOS ABS: 0 10*3/uL (ref 0.0–0.2)
Basos: 0 %
EOS (ABSOLUTE): 0.2 10*3/uL (ref 0.0–0.4)
Eos: 4 %
Hematocrit: 46.8 % (ref 37.5–51.0)
Hemoglobin: 15.6 g/dL (ref 13.0–17.7)
IMMATURE GRANULOCYTES: 0 %
Immature Grans (Abs): 0 10*3/uL (ref 0.0–0.1)
Lymphocytes Absolute: 1.9 10*3/uL (ref 0.7–3.1)
Lymphs: 37 %
MCH: 31.5 pg (ref 26.6–33.0)
MCHC: 33.3 g/dL (ref 31.5–35.7)
MCV: 94 fL (ref 79–97)
MONOS ABS: 0.4 10*3/uL (ref 0.1–0.9)
Monocytes: 8 %
NEUTROS PCT: 51 %
Neutrophils Absolute: 2.6 10*3/uL (ref 1.4–7.0)
PLATELETS: 267 10*3/uL (ref 150–379)
RBC: 4.96 x10E6/uL (ref 4.14–5.80)
RDW: 13.5 % (ref 12.3–15.4)
WBC: 5.1 10*3/uL (ref 3.4–10.8)

## 2018-03-03 LAB — CMP14+EGFR
ALT: 24 IU/L (ref 0–44)
AST: 20 IU/L (ref 0–40)
Albumin/Globulin Ratio: 2 (ref 1.2–2.2)
Albumin: 4.8 g/dL (ref 3.5–4.8)
Alkaline Phosphatase: 59 IU/L (ref 39–117)
BILIRUBIN TOTAL: 0.9 mg/dL (ref 0.0–1.2)
BUN/Creatinine Ratio: 17 (ref 10–24)
BUN: 17 mg/dL (ref 8–27)
CHLORIDE: 101 mmol/L (ref 96–106)
CO2: 22 mmol/L (ref 20–29)
Calcium: 10.7 mg/dL — ABNORMAL HIGH (ref 8.6–10.2)
Creatinine, Ser: 0.99 mg/dL (ref 0.76–1.27)
GFR calc non Af Amer: 77 mL/min/{1.73_m2} (ref >59)
GFR, EST AFRICAN AMERICAN: 89 mL/min/{1.73_m2} (ref >59)
GLUCOSE: 171 mg/dL — AB (ref 65–99)
Globulin, Total: 2.4 g/dL (ref 1.5–4.5)
POTASSIUM: 4.6 mmol/L (ref 3.5–5.2)
Sodium: 141 mmol/L (ref 134–144)
TOTAL PROTEIN: 7.2 g/dL (ref 6.0–8.5)

## 2018-03-03 LAB — LIPID PANEL
Chol/HDL Ratio: 4.1 ratio (ref 0.0–5.0)
Cholesterol, Total: 131 mg/dL (ref 100–199)
HDL: 32 mg/dL — AB (ref >39)
LDL Calculated: 53 mg/dL (ref 0–99)
TRIGLYCERIDES: 231 mg/dL — AB (ref 0–149)
VLDL CHOLESTEROL CAL: 46 mg/dL — AB (ref 5–40)

## 2018-03-03 MED ORDER — ROSUVASTATIN CALCIUM 40 MG PO TABS: ORAL_TABLET | ORAL | 1 refills | Status: DC

## 2018-03-03 MED ORDER — NIACIN ER (ANTIHYPERLIPIDEMIC) 1000 MG PO TBCR: EXTENDED_RELEASE_TABLET | ORAL | 1 refills | Status: DC

## 2018-03-03 MED ORDER — LISINOPRIL 10 MG PO TABS: 10.0000 mg | ORAL_TABLET | Freq: Every day | ORAL | 1 refills | Status: DC

## 2018-03-03 MED ORDER — METFORMIN HCL ER (MOD) 1000 MG PO TB24: 1000.0000 mg | ORAL_TABLET | Freq: Two times a day (BID) | ORAL | 1 refills | Status: DC

## 2018-03-03 MED ORDER — GLUCOSE BLOOD VI STRP: ORAL_STRIP | 2 refills | Status: DC

## 2018-03-03 NOTE — Progress Notes (Signed)
Subjective:  Patient ID: Billy Nolan,  male    DOB: [birth year]  Age: 72 y.o.    CC: Apnea (pt here today for routine follow up of his chronic medical conditions)   HPI Billy Nolan presents for  follow-up of hypertension. Patient has no history of headache chest pain or shortness of breath or recent cough. Patient also denies symptoms of TIA such as numbness weakness lateralizing. Patient denies side effects from medication. States taking it regularly.  Patient also  in for follow-up of elevated cholesterol. Doing well without complaints on current medication. Denies side effects of statin including myalgia and arthralgia and nausea. Also in today for liver function testing. Currently no chest pain, shortness of breath or other cardiovascular related symptoms noted.  Follow-up of diabetes. Patient does check blood sugar at home. Readings run between 150 and 200 Patient denies symptoms such as polyuria, polydipsia, excessive hunger, nausea No significant hypoglycemic spells noted. Medications reviewed. Pt reports taking them regularly. Pt. denies complication/adverse reaction today.    History Billy Nolan has a past medical history of Allergy, Diabetes mellitus without complication ([place]), GERD (gastroesophageal reflux disease), Hyperlipidemia, and Hypertension.   He has a past surgical history that includes dental surgeries and Lumbar laminectomy/decompression microdiscectomy (Left, 01/30/2016).   His family history includes Heart disease in his mother; Multiple sclerosis in his sister; Stroke in his father.He reports that he has never smoked. He has never used smokeless tobacco. He reports that he does not drink alcohol or use drugs.  Current Outpatient Medications on File Prior to Visit  Medication Sig Dispense Refill  . aspirin 81 MG tablet Take 81 mg by mouth 2 (two) times a week.     . Cholecalciferol 1000 UNITS capsule Take 1 capsule (1,000 Units total) by mouth daily. 90 capsule 3  .  fenofibrate (TRICOR) 145 MG tablet Take 1 tablet (145 mg total) by mouth daily. 90 tablet 3  . fluticasone (FLONASE) 50 MCG/ACT nasal spray Place 2 sprays into both nostrils daily. 16 g 6  . Glucosamine-Chondroit-Vit C-Mn (GLUCOSAMINE 1500 COMPLEX PO) Take 2 capsules by mouth every evening.     . Magnesium 400 MG CAPS Take 1 capsule by mouth at bedtime.     . metFORMIN (GLUCOPHAGE) 1000 MG tablet Take 1 tablet (1,000 mg total) by mouth 2 (two) times daily with a meal. 180 tablet 1  . sildenafil (REVATIO) 20 MG tablet Take 2 to 5 daily as needed for sex 60 tablet 5   No current facility-administered medications on file prior to visit.     ROS Review of Systems  Constitutional: Negative for chills, diaphoresis, fever and unexpected weight change.  HENT: Negative for congestion, hearing loss, rhinorrhea and sore throat.   Eyes: Negative for visual disturbance.  Respiratory: Negative for cough and shortness of breath.   Cardiovascular: Negative for chest pain.  Gastrointestinal: Negative for abdominal pain, constipation and diarrhea.  Genitourinary: Negative for dysuria and flank pain.  Musculoskeletal: Negative for arthralgias and joint swelling.  Skin: Negative for rash.  Neurological: Negative for dizziness and headaches.  Psychiatric/Behavioral: Negative for dysphoric mood and sleep disturbance.    Objective:  BP 113/78   Pulse (!) 103   Temp 98.7 F (37.1 C) (Oral)   Ht _0  (1.727 m)   Wt 179 lb 2 oz (81.3 kg)   BMI 27.24 kg/m   BP Readings from Last 3 Encounters:  03/03/18 113/78  11/21/17 130/80  08/19/17 123/81    Wt Readings from  Last 3 Encounters:  03/03/18 179 lb 2 oz (81.3 kg)  11/21/17 182 lb (82.6 kg)  08/19/17 182 lb (82.6 kg)     Physical Exam  Constitutional: He is oriented to person, place, and time. He appears well-developed and well-nourished. No distress.  HENT:  Head: Normocephalic and atraumatic.  Right Ear: External ear normal.  Left Ear:  External ear normal.  Nose: Nose normal.  Mouth/Throat: Oropharynx is clear and moist.  Eyes: Pupils are equal, round, and reactive to light. Conjunctivae and EOM are normal.  Neck: Normal range of motion. Neck supple. No thyromegaly present.  Cardiovascular: Normal rate, regular rhythm and normal heart sounds.  No murmur heard. Pulmonary/Chest: Effort normal and breath sounds normal. No respiratory distress. He has no wheezes. He has no rales.  Abdominal: Soft. Bowel sounds are normal. He exhibits no distension. There is no tenderness.  Lymphadenopathy:    He has no cervical adenopathy.  Neurological: He is alert and oriented to person, place, and time. He has normal reflexes.  Skin: Skin is warm and dry.  Psychiatric: He has a normal mood and affect. His behavior is normal. Judgment and thought content normal.   Assessment & Plan:   Billy Nolan was seen today for apnea.  Diagnoses and all orders for this visit:  Type 2 diabetes mellitus without complication, without long-term current use of insulin (HCC)  Hyperlipidemia, unspecified hyperlipidemia type -     CBC with Differential/Platelet -     CMP14+EGFR -     Lipid panel -     rosuvastatin (CRESTOR) 40 MG tablet; TAKE 1/2 (ONE-HALF) TABLET BY MOUTH ONCE DAILY  Other orders -     lisinopril (PRINIVIL,ZESTRIL) 10 MG tablet; Take 1 tablet (10 mg total) by mouth daily. -     metFORMIN (GLUMETZA) 1000 MG (MOD) 24 hr tablet; Take 1 tablet (1,000 mg total) by mouth 2 (two) times daily with a meal. -     glucose blood (ONETOUCH VERIO) test strip; USE 1 STRIP TO CHECK GLUCOSE TWICE DAILY -     niacin (NIASPAN) 1000 MG CR tablet; TAKE 1 TABLET BY MOUTH ONCE DAILY WITH BREAKFAST -     Pneumococcal polysaccharide vaccine 23-valent greater than or equal to 2yo subcutaneous/IM   I have discontinued Billy Nolan's losartan. I am also having him start on lisinopril and metFORMIN. Additionally, I am having him maintain his Glucosamine-Chondroit-Vit  C-Mn (GLUCOSAMINE 1500 COMPLEX PO), Magnesium, Cholecalciferol, aspirin, fluticasone, fenofibrate, metFORMIN, sildenafil, rosuvastatin, glucose blood, and niacin.  Meds ordered this encounter  Medications  . lisinopril (PRINIVIL,ZESTRIL) 10 MG tablet    Sig: Take 1 tablet (10 mg total) by mouth daily.    Dispense:  90 tablet    Refill:  1  . metFORMIN (GLUMETZA) 1000 MG (MOD) 24 hr tablet    Sig: Take 1 tablet (1,000 mg total) by mouth 2 (two) times daily with a meal.    Dispense:  180 tablet    Refill:  1  . rosuvastatin (CRESTOR) 40 MG tablet    Sig: TAKE 1/2 (ONE-HALF) TABLET BY MOUTH ONCE DAILY    Dispense:  45 tablet    Refill:  1  . glucose blood (ONETOUCH VERIO) test strip    Sig: USE 1 STRIP TO CHECK GLUCOSE TWICE DAILY    Dispense:  300 each    Refill:  2    Dx E11.9  . niacin (NIASPAN) 1000 MG CR tablet    Sig: TAKE 1 TABLET BY MOUTH ONCE DAILY  WITH BREAKFAST    Dispense:  90 tablet    Refill:  1     Follow-up: Return in about 3 months (around 06/03/2018).  [name], M.D.

## 2018-03-03 NOTE — Telephone Encounter (Signed)
WM saying you wrote for Metformin modified release which is very very expensive  Standard ER is $10.00

## 2018-03-03 NOTE — Telephone Encounter (Signed)
Please change to standard ER

## 2018-03-05 ENCOUNTER — Encounter: Payer: Self-pay | Admitting: Family Medicine

## 2018-04-25 ENCOUNTER — Ambulatory Visit (INDEPENDENT_AMBULATORY_CARE_PROVIDER_SITE_OTHER): Payer: PPO | Admitting: Physician Assistant

## 2018-04-25 ENCOUNTER — Encounter: Payer: Self-pay | Admitting: Physician Assistant

## 2018-04-25 VITALS — BP 123/74 | HR 111 | Temp 97.8°F | Ht 68.0 in | Wt 178.5 lb

## 2018-04-25 DIAGNOSIS — H6121 Impacted cerumen, right ear: Secondary | ICD-10-CM | POA: Diagnosis not present

## 2018-04-25 NOTE — Patient Instructions (Signed)
Earwax Buildup, Adult The ears produce a substance called earwax that helps keep bacteria out of the ear and protects the skin in the ear canal. Occasionally, earwax can build up in the ear and cause discomfort or hearing loss. What increases the risk? This condition is more likely to develop in people who:  Are male.  Are elderly.  Naturally produce more earwax.  Clean their ears often with cotton swabs.  Use earplugs often.  Use in-ear headphones often.  Wear hearing aids.  Have narrow ear canals.  Have earwax that is overly thick or sticky.  Have eczema.  Are dehydrated.  Have excess hair in the ear canal.  What are the signs or symptoms? Symptoms of this condition include:  Reduced or muffled hearing.  A feeling of fullness in the ear or feeling that the ear is plugged.  Fluid coming from the ear.  Ear pain.  Ear itch.  Ringing in the ear.  Coughing.  An obvious piece of earwax that can be seen inside the ear canal.  How is this diagnosed? This condition may be diagnosed based on:  Your symptoms.  Your medical history.  An ear exam. During the exam, your health care provider will look into your ear with an instrument called an otoscope.  You may have tests, including a hearing test. How is this treated? This condition may be treated by:  Using ear drops to soften the earwax.  Having the earwax removed by a health care provider. The health care provider may: ? Flush the ear with water. ? Use an instrument that has a loop on the end (curette). ? Use a suction device.  Surgery to remove the wax buildup. This may be done in severe cases.  Follow these instructions at home:  Take over-the-counter and prescription medicines only as told by your health care provider.  Do not put any objects, including cotton swabs, into your ear. You can clean the opening of your ear canal with a washcloth or facial tissue.  Follow instructions from your health  care provider about cleaning your ears. Do not over-clean your ears.  Drink enough fluid to keep your urine clear or pale yellow. This will help to thin the earwax.  Keep all follow-up visits as told by your health care provider. If earwax builds up in your ears often or if you use hearing aids, consider seeing your health care provider for routine, preventive ear cleanings. Ask your health care provider how often you should schedule your cleanings.  If you have hearing aids, clean them according to instructions from the manufacturer and your health care provider. Contact a health care provider if:  You have ear pain.  You develop a fever.  You have blood, pus, or other fluid coming from your ear.  You have hearing loss.  You have ringing in your ears that does not go away.  Your symptoms do not improve with treatment.  You feel like the room is spinning (vertigo). Summary  Earwax can build up in the ear and cause discomfort or hearing loss.  The most common symptoms of this condition include reduced or muffled hearing and a feeling of fullness in the ear or feeling that the ear is plugged.  This condition may be diagnosed based on your symptoms, your medical history, and an ear exam.  This condition may be treated by using ear drops to soften the earwax or by having the earwax removed by a health care provider.  Do   not put any objects, including cotton swabs, into your ear. You can clean the opening of your ear canal with a washcloth or facial tissue. This information is not intended to replace advice given to you by your health care provider. Make sure you discuss any questions you have with your health care provider. Document Released: 12/30/2004 Document Revised: 02/02/2017 Document Reviewed: 02/02/2017 Elsevier Interactive Patient Education  2018 [employer]  

## 2018-04-25 NOTE — Progress Notes (Signed)
  Subjective:     Patient ID: Billy Nolan, male   DOB: 1946-05-28, 72 y.o.   MRN: [id number]  HPI Decreased hearing to the R ear x  2 days Tried irrigating at home with sl improvement  No prev hx of same  Review of Systems  Constitutional: Negative.   HENT: Positive for hearing loss. Negative for congestion, ear discharge, ear pain, postnasal drip, sinus pressure and sinus pain.    Denies nay pain or drainage from the ear    Objective:   Physical Exam  Constitutional: He appears well-developed and well-nourished. No distress.  HENT:  Right Ear: External ear normal.  Left Ear: External ear normal.  R ear canal impacted with cerumen. L ear canal and TM nl R ear irrigated with removal of cerumen plug Following irrigation canal and TM nl  Cardiovascular:  No murmur heard. Skin: He is not diaphoretic.  Nursing note and vitals reviewed.      Assessment:     1. Impacted cerumen of right ear        Plan:     No Qtip use OTC wax softener monthly F/U prn

## 2018-04-28 DIAGNOSIS — E119 Type 2 diabetes mellitus without complications: Secondary | ICD-10-CM | POA: Diagnosis not present

## 2018-04-28 DIAGNOSIS — Z7984 Long term (current) use of oral hypoglycemic drugs: Secondary | ICD-10-CM | POA: Diagnosis not present

## 2018-04-28 LAB — HM DIABETES EYE EXAM

## 2018-09-07 ENCOUNTER — Other Ambulatory Visit: Payer: Self-pay | Admitting: Family Medicine

## 2018-09-29 ENCOUNTER — Other Ambulatory Visit: Payer: Self-pay | Admitting: Family Medicine

## 2018-10-02 NOTE — Telephone Encounter (Signed)
Last seen 04/25/18

## 2018-10-09 ENCOUNTER — Other Ambulatory Visit: Payer: Self-pay | Admitting: Family Medicine

## 2018-10-10 NOTE — Telephone Encounter (Signed)
Last seen 04/25/18

## 2018-10-18 ENCOUNTER — Other Ambulatory Visit: Payer: Self-pay | Admitting: Family Medicine

## 2018-10-31 ENCOUNTER — Encounter: Payer: Self-pay | Admitting: Family Medicine

## 2018-10-31 ENCOUNTER — Ambulatory Visit (INDEPENDENT_AMBULATORY_CARE_PROVIDER_SITE_OTHER): Payer: PPO | Admitting: Family Medicine

## 2018-10-31 VITALS — BP 133/89 | HR 83 | Temp 97.1°F | Ht 68.0 in | Wt 176.6 lb

## 2018-10-31 DIAGNOSIS — Z23 Encounter for immunization: Secondary | ICD-10-CM | POA: Diagnosis not present

## 2018-10-31 DIAGNOSIS — E785 Hyperlipidemia, unspecified: Secondary | ICD-10-CM

## 2018-10-31 DIAGNOSIS — E119 Type 2 diabetes mellitus without complications: Secondary | ICD-10-CM | POA: Diagnosis not present

## 2018-10-31 LAB — BAYER DCA HB A1C WAIVED: HB A1C (BAYER DCA - WAIVED): 7.1 % — ABNORMAL HIGH (ref <7.0)

## 2018-10-31 MED ORDER — FENOFIBRATE 145 MG PO TABS: 145.0000 mg | ORAL_TABLET | Freq: Every day | ORAL | 1 refills | Status: DC

## 2018-10-31 MED ORDER — ROSUVASTATIN CALCIUM 40 MG PO TABS: ORAL_TABLET | ORAL | 1 refills | Status: DC

## 2018-10-31 MED ORDER — LISINOPRIL 10 MG PO TABS: 10.0000 mg | ORAL_TABLET | Freq: Every day | ORAL | 1 refills | Status: DC

## 2018-10-31 MED ORDER — METFORMIN HCL 1000 MG PO TABS: 1000.0000 mg | ORAL_TABLET | Freq: Two times a day (BID) | ORAL | 1 refills | Status: DC

## 2018-10-31 MED ORDER — SILDENAFIL CITRATE 20 MG PO TABS: ORAL_TABLET | ORAL | 5 refills | Status: DC

## 2018-10-31 NOTE — Patient Instructions (Signed)
Fasting Blood Sugars should be under 125 Check Blood sugars daily Return in 3 months

## 2018-10-31 NOTE — Progress Notes (Signed)
Subjective:  Patient ID: Billy Nolan,  male    DOB: 1946-05-07  Age: 72 y.o.    CC: Medical Management of Chronic Issues   HPI Billy Nolan presents for  follow-up of hypertension. Patient has no history of headache chest pain or shortness of breath or recent cough. Patient also denies symptoms of TIA such as numbness weakness lateralizing. Patient denies side effects from medication. States taking it regularly.  Patient also  in for follow-up of elevated cholesterol. Doing well without complaints on current medication. Denies side effects  including myalgia and arthralgia and nausea. Also in today for liver function testing. Currently no chest pain, shortness of breath or other cardiovascular related symptoms noted.  Follow-up of diabetes. Patient does check blood sugar at home. Readings run between 150 and 250 Patient denies symptoms such as excessive hunger or urinary frequency, excessive hunger, nausea No significant hypoglycemic spells noted. Medications reviewed. Pt reports taking them regularly. Pt. denies complication/adverse reaction today.    History Billy Nolan has a past medical history of Allergy, Diabetes mellitus without complication ([place]), GERD (gastroesophageal reflux disease), Hyperlipidemia, and Hypertension.   He has a past surgical history that includes dental surgeries and Lumbar laminectomy/decompression microdiscectomy (Left, 01/30/2016).   His family history includes Heart disease in his mother; Multiple sclerosis in his sister; Stroke in his father.He reports that he has never smoked. He has never used smokeless tobacco. He reports that he does not drink alcohol or use drugs.  Current Outpatient Medications on File Prior to Visit  Medication Sig Dispense Refill  . Cholecalciferol 1000 UNITS capsule Take 1 capsule (1,000 Units total) by mouth daily. 90 capsule 3  . Glucosamine-Chondroit-Vit C-Mn (GLUCOSAMINE 1500 COMPLEX PO) Take 2 capsules by mouth every evening.      Marland Kitchen glucose blood (ONETOUCH VERIO) test strip USE 1 STRIP TO CHECK GLUCOSE TWICE DAILY 300 each 2  . Magnesium 400 MG CAPS Take 1 capsule by mouth at bedtime.     . niacin (NIASPAN) 1000 MG CR tablet TAKE 1 TABLET BY MOUTH ONCE DAILY WITH BREAKFAST 90 tablet 1   No current facility-administered medications on file prior to visit.     ROS Review of Systems  Constitutional: Negative.   HENT: Negative.   Eyes: Negative for visual disturbance.  Respiratory: Negative for cough and shortness of breath.   Cardiovascular: Negative for chest pain and leg swelling.  Gastrointestinal: Negative for abdominal pain, diarrhea, nausea and vomiting.  Genitourinary: Negative for difficulty urinating.  Musculoskeletal: Negative for arthralgias and myalgias.  Skin: Negative for rash.  Neurological: Negative for headaches.  Psychiatric/Behavioral: Negative for sleep disturbance.    Objective:  BP 133/89   Pulse 83   Temp (!) 97.1 F (36.2 C) (Oral)   Ht _0  (1.727 m)   Wt 176 lb 9.6 oz (80.1 kg)   BMI 26.85 kg/m   BP Readings from Last 3 Encounters:  10/31/18 133/89  04/25/18 123/74  03/03/18 113/78    Wt Readings from Last 3 Encounters:  10/31/18 176 lb 9.6 oz (80.1 kg)  04/25/18 178 lb 8 oz (81 kg)  03/03/18 179 lb 2 oz (81.3 kg)     Physical Exam  Constitutional: He is oriented to person, place, and time. He appears well-developed and well-nourished. No distress.  HENT:  Head: Normocephalic and atraumatic.  Right Ear: External ear normal.  Left Ear: External ear normal.  Nose: Nose normal.  Mouth/Throat: Oropharynx is clear and moist.  Eyes: Pupils are equal, round,  and reactive to light. Conjunctivae and EOM are normal.  Neck: Normal range of motion. Neck supple.  Cardiovascular: Normal rate, regular rhythm and normal heart sounds.  No murmur heard. Pulmonary/Chest: Effort normal and breath sounds normal. No respiratory distress. He has no wheezes. He has no rales.    Abdominal: Soft. There is no tenderness.  Musculoskeletal: Normal range of motion.  Neurological: He is alert and oriented to person, place, and time. He has normal reflexes.  Skin: Skin is warm and dry.  Psychiatric: He has a normal mood and affect. His behavior is normal. Judgment and thought content normal.    Diabetic Foot Exam - Simple   No data filed        Assessment & Plan:   Billy Nolan was seen today for medical management of chronic issues.  Diagnoses and all orders for this visit:  Type 2 diabetes mellitus without complication, without long-term current use of insulin (HCC) -     CBC with Differential/Platelet -     CMP14+EGFR -     Lipid panel -     PSA, total and free -     Microalbumin / creatinine urine ratio -     Bayer DCA Hb A1c Waived  Hyperlipidemia, unspecified hyperlipidemia type -     CBC with Differential/Platelet -     CMP14+EGFR -     Lipid panel -     PSA, total and free -     Microalbumin / creatinine urine ratio -     Bayer DCA Hb A1c Waived -     rosuvastatin (CRESTOR) 40 MG tablet; TAKE 1/2 (ONE-HALF) TABLET BY MOUTH ONCE DAILY  Other orders -     sildenafil (REVATIO) 20 MG tablet; Take 2 to 5 daily as needed for sex -     metFORMIN (GLUCOPHAGE) 1000 MG tablet; Take 1 tablet (1,000 mg total) by mouth 2 (two) times daily with a meal. -     lisinopril (PRINIVIL,ZESTRIL) 10 MG tablet; Take 1 tablet (10 mg total) by mouth daily. -     fenofibrate (TRICOR) 145 MG tablet; Take 1 tablet (145 mg total) by mouth daily.   I have discontinued Alfio Olgin's aspirin, fluticasone, and metFORMIN. I have also changed his metFORMIN, lisinopril, and fenofibrate. Additionally, I am having him maintain his Glucosamine-Chondroit-Vit C-Mn (GLUCOSAMINE 1500 COMPLEX PO), Magnesium, Cholecalciferol, glucose blood, niacin, sildenafil, and rosuvastatin.  Meds ordered this encounter  Medications  . sildenafil (REVATIO) 20 MG tablet    Sig: Take 2 to 5 daily as needed  for sex    Dispense:  60 tablet    Refill:  5  . rosuvastatin (CRESTOR) 40 MG tablet    Sig: TAKE 1/2 (ONE-HALF) TABLET BY MOUTH ONCE DAILY    Dispense:  45 tablet    Refill:  1  . metFORMIN (GLUCOPHAGE) 1000 MG tablet    Sig: Take 1 tablet (1,000 mg total) by mouth 2 (two) times daily with a meal.    Dispense:  180 tablet    Refill:  1  . lisinopril (PRINIVIL,ZESTRIL) 10 MG tablet    Sig: Take 1 tablet (10 mg total) by mouth daily.    Dispense:  90 tablet    Refill:  1    Please consider 90 day supplies to promote better adherence  . fenofibrate (TRICOR) 145 MG tablet    Sig: Take 1 tablet (145 mg total) by mouth daily.    Dispense:  90 tablet    Refill:  1   Fasting Blood Sugars should be under 125 Check Blood sugars daily Return in 3 months  Follow-up: Return in about 3 months (around 01/31/2019).  [name], M.D.

## 2018-11-01 DIAGNOSIS — Z23 Encounter for immunization: Secondary | ICD-10-CM | POA: Diagnosis not present

## 2018-11-01 DIAGNOSIS — E119 Type 2 diabetes mellitus without complications: Secondary | ICD-10-CM | POA: Diagnosis not present

## 2018-11-01 DIAGNOSIS — E785 Hyperlipidemia, unspecified: Secondary | ICD-10-CM | POA: Diagnosis not present

## 2018-11-01 LAB — CMP14+EGFR
ALK PHOS: 46 IU/L (ref 39–117)
ALT: 22 IU/L (ref 0–44)
AST: 18 IU/L (ref 0–40)
Albumin/Globulin Ratio: 2.5 — ABNORMAL HIGH (ref 1.2–2.2)
Albumin: 4.7 g/dL (ref 3.5–4.8)
BUN/Creatinine Ratio: 11 (ref 10–24)
BUN: 10 mg/dL (ref 8–27)
Bilirubin Total: 0.8 mg/dL (ref 0.0–1.2)
CHLORIDE: 102 mmol/L (ref 96–106)
CO2: 22 mmol/L (ref 20–29)
CREATININE: 0.95 mg/dL (ref 0.76–1.27)
Calcium: 9.8 mg/dL (ref 8.6–10.2)
GFR calc Af Amer: 93 mL/min/{1.73_m2} (ref >59)
GFR calc non Af Amer: 81 mL/min/{1.73_m2} (ref >59)
GLUCOSE: 164 mg/dL — AB (ref 65–99)
Globulin, Total: 1.9 g/dL (ref 1.5–4.5)
Potassium: 4.2 mmol/L (ref 3.5–5.2)
Sodium: 141 mmol/L (ref 134–144)
Total Protein: 6.6 g/dL (ref 6.0–8.5)

## 2018-11-01 LAB — LIPID PANEL
CHOLESTEROL TOTAL: 118 mg/dL (ref 100–199)
Chol/HDL Ratio: 3.6 ratio (ref 0.0–5.0)
HDL: 33 mg/dL — AB (ref >39)
LDL Calculated: 53 mg/dL (ref 0–99)
TRIGLYCERIDES: 159 mg/dL — AB (ref 0–149)
VLDL CHOLESTEROL CAL: 32 mg/dL (ref 5–40)

## 2018-11-01 LAB — CBC WITH DIFFERENTIAL/PLATELET
BASOS ABS: 0 10*3/uL (ref 0.0–0.2)
Basos: 1 %
EOS (ABSOLUTE): 0.2 10*3/uL (ref 0.0–0.4)
Eos: 3 %
Hematocrit: 41.7 % (ref 37.5–51.0)
Hemoglobin: 14.6 g/dL (ref 13.0–17.7)
IMMATURE GRANS (ABS): 0 10*3/uL (ref 0.0–0.1)
Immature Granulocytes: 0 %
LYMPHS ABS: 1.7 10*3/uL (ref 0.7–3.1)
LYMPHS: 33 %
MCH: 32.2 pg (ref 26.6–33.0)
MCHC: 35 g/dL (ref 31.5–35.7)
MCV: 92 fL (ref 79–97)
Monocytes Absolute: 0.3 10*3/uL (ref 0.1–0.9)
Monocytes: 5 %
NEUTROS PCT: 58 %
Neutrophils Absolute: 2.9 10*3/uL (ref 1.4–7.0)
PLATELETS: 229 10*3/uL (ref 150–450)
RBC: 4.53 x10E6/uL (ref 4.14–5.80)
RDW: 12.9 % (ref 12.3–15.4)
WBC: 5 10*3/uL (ref 3.4–10.8)

## 2018-11-01 LAB — PSA, TOTAL AND FREE
PSA FREE PCT: 34 %
PSA FREE: 0.17 ng/mL
Prostate Specific Ag, Serum: 0.5 ng/mL (ref 0.0–4.0)

## 2018-11-01 LAB — MICROALBUMIN / CREATININE URINE RATIO
Creatinine, Urine: 112.1 mg/dL
MICROALB/CREAT RATIO: 5.2 mg/g{creat} (ref 0.0–30.0)
Microalbumin, Urine: 5.8 ug/mL

## 2019-01-05 ENCOUNTER — Other Ambulatory Visit: Payer: Self-pay | Admitting: Family Medicine

## 2019-02-02 ENCOUNTER — Ambulatory Visit: Payer: PPO | Admitting: Family Medicine

## 2019-03-13 ENCOUNTER — Other Ambulatory Visit: Payer: Self-pay | Admitting: Family Medicine

## 2019-04-03 ENCOUNTER — Other Ambulatory Visit: Payer: Self-pay | Admitting: Family Medicine

## 2019-04-16 ENCOUNTER — Ambulatory Visit (INDEPENDENT_AMBULATORY_CARE_PROVIDER_SITE_OTHER): Payer: PPO | Admitting: Family Medicine

## 2019-04-16 ENCOUNTER — Encounter: Payer: Self-pay | Admitting: Family Medicine

## 2019-04-16 ENCOUNTER — Other Ambulatory Visit: Payer: Self-pay

## 2019-04-16 ENCOUNTER — Telehealth: Payer: Self-pay | Admitting: *Deleted

## 2019-04-16 DIAGNOSIS — E785 Hyperlipidemia, unspecified: Secondary | ICD-10-CM | POA: Diagnosis not present

## 2019-04-16 DIAGNOSIS — E119 Type 2 diabetes mellitus without complications: Secondary | ICD-10-CM

## 2019-04-16 MED ORDER — METFORMIN HCL ER (MOD) 1000 MG PO TB24: 1000.0000 mg | ORAL_TABLET | Freq: Every day | ORAL | 2 refills | Status: DC

## 2019-04-16 MED ORDER — FENOFIBRATE 145 MG PO TABS: 145.0000 mg | ORAL_TABLET | Freq: Every day | ORAL | 1 refills | Status: DC

## 2019-04-16 MED ORDER — LISINOPRIL 10 MG PO TABS: 10.0000 mg | ORAL_TABLET | Freq: Every day | ORAL | 1 refills | Status: DC

## 2019-04-16 MED ORDER — ROSUVASTATIN CALCIUM 40 MG PO TABS: ORAL_TABLET | ORAL | 1 refills | Status: DC

## 2019-04-16 MED ORDER — NIACIN ER (ANTIHYPERLIPIDEMIC) 1000 MG PO TBCR: 1000.0000 mg | EXTENDED_RELEASE_TABLET | Freq: Every day | ORAL | 5 refills | Status: DC

## 2019-04-16 NOTE — Progress Notes (Signed)
Subjective:    Patient ID: Billy Nolan, male    DOB: 10-Nov-1946, 73 y.o.   MRN: [id number]   HPI: Billy Nolan is a 73 y.o. male presenting for diabetes follow-up of diabetes. Patient does check blood sugar at home Patient denies symptoms such as polyuria, polydipsia, excessive hunger, nausea No significant hypoglycemic spells noted. Medications as noted below. Taking them regularly without complication/adverse reaction being reported today.  Glucose running 160-218 fasting. Avoiding sodas, eating to much starch.   Took his own BP this morning =114/73, 97  Patient in for follow-up of elevated cholesterol. Doing well without complaints on current medication. Denies side effects of statin including myalgia and arthralgia and nausea. Also in today for liver function testing. Currently no chest pain, shortness of breath or other cardiovascular related symptoms noted.  Depression screen [facility] 2/9 10/31/2018 03/03/2018 11/21/2017 08/19/2017 05/19/2017  Decreased Interest 0 0 0 0 0  Down, Depressed, Hopeless 0 0 0 0 0  PHQ - 2 Score 0 0 0 0 0     Relevant past medical, surgical, family and social history reviewed and updated as indicated.  Interim medical history since our last visit reviewed. Allergies and medications reviewed and updated.  ROS:  Review of Systems  Constitutional: Negative.   HENT: Negative.   Eyes: Negative for visual disturbance.  Respiratory: Positive for cough (from pollen). Negative for shortness of breath.   Cardiovascular: Negative for chest pain and leg swelling.  Gastrointestinal: Negative for abdominal pain, diarrhea, nausea and vomiting.  Genitourinary: Negative for difficulty urinating.  Musculoskeletal: Negative for arthralgias and myalgias.  Skin: Negative for rash.  Neurological: Negative for headaches.  Psychiatric/Behavioral: Negative for sleep disturbance.     Social History   Tobacco Use  Smoking Status Never Smoker  Smokeless Tobacco Never  Used       Objective:     Wt Readings from Last 3 Encounters:  10/31/18 176 lb 9.6 oz (80.1 kg)  04/25/18 178 lb 8 oz (81 kg)  03/03/18 179 lb 2 oz (81.3 kg)     Exam deferred. Pt. Harboring due to COVID 19. Phone visit performed.   Assessment & Plan:   1. Type 2 diabetes mellitus without complication, without long-term current use of insulin (HCC)     No orders of the defined types were placed in this encounter.   No orders of the defined types were placed in this encounter.     Diagnoses and all orders for this visit:  Type 2 diabetes mellitus without complication, without long-term current use of insulin ([place])    Virtual Visit via telephone Note  I discussed the limitations, risks, security and privacy concerns of performing an evaluation and management service by telephone and the availability of in person appointments. The patient was identified with two identifiers. Pt.expressed understanding and agreed to proceed. Pt. Is at home. Dr. Livia Snellen is in his office.  Follow Up Instructions:   I discussed the assessment and treatment plan with the patient. The patient was provided an opportunity to ask questions and all were answered. The patient agreed with the plan and demonstrated an understanding of the instructions.   The patient was advised to call back or seek an in-person evaluation if the symptoms worsen or if the condition fails to improve as anticipated.   Total minutes including chart review and phone contact time: 20   Follow up plan: Return in about 3 months (around 07/17/2019) for diabetes.  [name], MD [facility]

## 2019-04-17 ENCOUNTER — Other Ambulatory Visit: Payer: Self-pay | Admitting: Family Medicine

## 2019-04-17 MED ORDER — METFORMIN HCL ER 500 MG PO TB24: 1000.0000 mg | ORAL_TABLET | Freq: Two times a day (BID) | ORAL | 2 refills | Status: DC

## 2019-04-17 NOTE — Telephone Encounter (Signed)
Replacement scrip sent. Thanks, WS

## 2019-04-17 NOTE — Telephone Encounter (Signed)
Patient aware and verbalizes understanding. 

## 2019-04-26 ENCOUNTER — Other Ambulatory Visit: Payer: Self-pay

## 2019-04-26 ENCOUNTER — Ambulatory Visit (INDEPENDENT_AMBULATORY_CARE_PROVIDER_SITE_OTHER): Payer: PPO | Admitting: *Deleted

## 2019-04-26 ENCOUNTER — Encounter: Payer: Self-pay | Admitting: *Deleted

## 2019-04-26 DIAGNOSIS — Z0000 Encounter for general adult medical examination without abnormal findings: Secondary | ICD-10-CM

## 2019-04-26 DIAGNOSIS — Z1159 Encounter for screening for other viral diseases: Secondary | ICD-10-CM

## 2019-04-26 NOTE — Progress Notes (Signed)
MEDICARE ANNUAL WELLNESS VISIT  04/26/2019  Telephone Visit Disclaimer This Medicare AWV was conducted by telephone due to national recommendations for restrictions regarding the COVID-19 Pandemic (e.g. social distancing).  I verified, using two identifiers, that I am speaking with [name] or their authorized healthcare agent. I discussed the limitations, risks, security, and privacy concerns of performing an evaluation and management service by telephone and the potential availability of an in-person appointment in the future. The patient expressed understanding and agreed to proceed.   Subjective:  Billy Nolan is a 73 y.o. male patient of Stacks, Billy Gash, MD who had a Medicare Annual Wellness Visit today via telephone. 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  He lives with his wife and 3 adult sons. Mr. Billy Nolan does not have grandchildren.  He reports that he is socially active and does interact with friends/family regularly. He is active in his church.  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  Patient Care Team: [name], MD as PCP - General (Family Medicine)  Advanced Directives 04/26/2019 01/30/2016 01/27/2016 09/12/2014 08/28/2014  Does Patient Have a Medical Advance Directive? Yes Yes Yes Yes Yes  Type of Paramedic of [place];Living will [place];Living will [place];Living will [place];Living will [place];Living will  Does patient want to make changes to medical advance directive? No - Patient declined No - Patient declined No - Patient declined - -  Copy of [place] in Chart? No - copy requested No - copy requested No - copy requested - No - copy requested  Would patient like information on creating a medical advance directive? - - - - No - patient declined information    Hospital  Utilization Over the Past 12 Months: # of hospitalizations or ER visits: 0 # of surgeries: 0  Review of Systems    Patient reports that his overall health is unchanged compared to last year.    Review of Systems:   All systems negative as reported by patient.  Pain Assessment Pain Score: 0-No pain     Current Medications & Allergies (verified) Allergies as of 04/26/2019      Reactions   Sulfa Antibiotics Rash      Medication List       Accurate as of Apr 26, 2019  3:50 PM. If you have any questions, ask your nurse or doctor.        Cholecalciferol 25 MCG (1000 UT) capsule Take 1 capsule (1,000 Units total) by mouth daily.   fenofibrate 145 MG tablet Commonly known as:  TRICOR Take 1 tablet (145 mg total) by mouth daily.   GLUCOSAMINE 1500 COMPLEX PO Take 2 capsules by mouth every evening.   glucose blood test strip Commonly known as:  OneTouch Verio USE 1 STRIP TO CHECK GLUCOSE TWICE DAILY   lisinopril 10 MG tablet Commonly known as:  ZESTRIL Take 1 tablet (10 mg total) by mouth daily. (Please make 6 mos med ckup)   Magnesium 400 MG Caps Take 1 capsule by mouth at bedtime.   metFORMIN 500 MG 24 hr tablet Commonly known as:  GLUCOPHAGE-XR Take 2 tablets (1,000 mg total) by mouth 2 (two) times daily.   niacin 1000 MG CR tablet Commonly known as:  NIASPAN Take 1 tablet (1,000 mg total) by mouth daily with breakfast. (Needs to be seen before next refill)  rosuvastatin 40 MG tablet Commonly known as:  CRESTOR TAKE 1/2 (ONE-HALF) TABLET BY MOUTH ONCE DAILY   sildenafil 20 MG tablet Commonly known as:  REVATIO Take 2 to 5 daily as needed for sex       History (reviewed): Past Medical History:  Diagnosis Date  . Allergy   . Diabetes mellitus without complication ([place])   . GERD (gastroesophageal reflux disease)   . Hyperlipidemia   . Hypertension    Past Surgical History:  Procedure Laterality Date  . dental surgeries     teeth extractions,  root cannal  . LUMBAR LAMINECTOMY/DECOMPRESSION MICRODISCECTOMY Left 01/30/2016   Procedure: LUMBAR LAMINECTOMY/CENTRAL DECOMPRESSION MICRODISCECTOMY L3 - L4 ON THE LEFT;  Surgeon: Latanya Maudlin, MD;  Location: [facility];  Service: Orthopedics;  Laterality: Left;   Family History  Problem Relation Age of Onset  . Heart disease Mother   . Stroke Father   . Heart disease Father   . Multiple sclerosis Sister   . Cancer Sister   . Ulcerative colitis Son   . Colon cancer Neg Hx    Social History   Socioeconomic History  . Marital status: Married    Spouse name: Not on file  . Number of children: 3  . Years of education: 65  . Highest education level: Some college, no degree  Occupational History  . Occupation: Retired     Comment: Cytogeneticist  Social Needs  . Financial resource strain: Not hard at all  . Food insecurity:    Worry: Never true    Inability: Never true  . Transportation needs:    Medical: No    Non-medical: No  Tobacco Use  . Smoking status: Never Smoker  . Smokeless tobacco: Never Used  Substance and Sexual Activity  . Alcohol use: No  . Drug use: No  . Sexual activity: Not on file  Lifestyle  . Physical activity:    Days per week: 6 days    Minutes per session: 40 min  . Stress: Not at all  Relationships  . Social connections:    Talks on phone: More than three times a week    Gets together: More than three times a week    Attends religious service: More than 4 times per year    Active member of club or organization: No    Attends meetings of clubs or organizations: Never    Relationship status: Married  Other Topics Concern  . Not on file  Social History Narrative  . Not on file    Activities of Daily Living In your present state of health, do you have any difficulty performing the following activities: 04/26/2019  Hearing? Y- patient declines audiology referral at this time  Vision? N  Difficulty concentrating or making decisions? N   Walking or climbing stairs? N  Dressing or bathing? N  Doing errands, shopping? N  Preparing Food and eating ? N  Using the Toilet? N  In the past six months, have you accidently leaked urine? N  Do you have problems with loss of bowel control? N  Managing your Medications? N  Managing your Finances? N  Housekeeping or managing your Housekeeping? N  Some recent data might be hidden    Exercise Current Exercise Habits: (P) Home exercise routine, Type of exercise: walking, Time (Minutes): 45, Frequency (Times/Week): 5, Weekly Exercise (Minutes/Week): 225, Intensity: Moderate, Exercise limited by: orthopedic condition(s)  Diet Patient reports consuming 3 meals a day and 1 snack(s) a day  Patient reports that his primary diet is: Diabetic Patient reports that she does have regular access to food.   Depression Screen PHQ 2/9 Scores 04/26/2019 10/31/2018 03/03/2018 11/21/2017 08/19/2017 05/19/2017 12/15/2016  PHQ - 2 Score 0 0 0 0 0 0 0     Fall Risk Fall Risk  04/26/2019 10/31/2018 03/03/2018 08/19/2017 05/19/2017  Falls in the past year? 0 0 No No No  Follow up Falls prevention discussed - - - -     Objective:  [name] seemed alert and oriented and he participated appropriately during our telephone visit.  Blood Pressure Weight BMI  BP Readings from Last 3 Encounters:  10/31/18 133/89  04/25/18 123/74  03/03/18 113/78   Wt Readings from Last 3 Encounters:  10/31/18 176 lb 9.6 oz (80.1 kg)  04/25/18 178 lb 8 oz (81 kg)  03/03/18 179 lb 2 oz (81.3 kg)   BMI Readings from Last 1 Encounters:  10/31/18 26.85 kg/m    *Unable to obtain current vital signs, weight, and BMI due to telephone visit type  Hearing/Vision  . D[place]an did not seem to have difficulty with hearing/understanding during the telephone conversation . Reports that he has had a formal eye exam by an eye care professional within the past year . Reports that he has not had a formal hearing evaluation within the past  year *Unable to fully assess hearing and vision during telephone visit type  Cognitive Function: 6CIT Screen 04/26/2019  What Year? 0 points  What month? 0 points  What time? 0 points  Count back from 20 0 points  Months in reverse 4 points  Repeat phrase 0 points  Total Score 4    Normal Cognitive Function Screening: Yes (Normal:0-7, Significant for Dysfunction: >8)  Immunization & Health Maintenance Record Immunization History  Administered Date(s) Administered  . Influenza, High Dose Seasonal PF 11/01/2018  . Pneumococcal Conjugate-13 07/03/2014  . Pneumococcal Polysaccharide-23 03/03/2018   Recommend Tdap and Shingrix vaccines in the future.   Health Maintenance  Topic Date Due  . Hepatitis C Screening  09/25/1946  . FOOT EXAM  05/19/2018  . TETANUS/TDAP  11/01/2019 (Originally 12/06/2017)  . OPHTHALMOLOGY EXAM  04/29/2019  . HEMOGLOBIN A1C  05/01/2019  . INFLUENZA VACCINE  07/07/2019  . COLONOSCOPY  09/12/2024  . PNA vac Low Risk Adult  Completed       Assessment  This is a routine wellness examination for [employer].  Health Maintenance: Due or Overdue Health Maintenance Due  Topic Date Due  . Hepatitis C Screening  Dec 13, 1945  . FOOT EXAM  05/19/2018   Recommend Hepatitis C screening and foot exam at next visit with Dr. Currie Paris does not need a referral for Community Assistance: Care Management:   no Social Work:    no Prescription Assistance:  no Nutrition/Diabetes Education:  no   Plan:  Personalized Goals Goals Addressed            This Visit's Progress   . HEMOGLOBIN A1C < 7.0       Continue to monitor carbohydrate intake, exercise, and taking diabetes medications regularly.       Personalized Health Maintenance & Screening Recommendations  Advanced directives: has an advanced directive - a copy HAS NOT been provided.  Lung Cancer Screening Recommended: no (Low Dose CT Chest recommended if Age 48-80 years, 30 pack-year  currently smoking OR have quit w/in past 15 years) Hepatitis C Screening recommended: recommend at next visit with Dr. Warrick Parisian  Advanced Directives: Written information was not prepared per patient's request.  Referrals & Orders Future order- Hepatitis C antibody test  Follow-up Plan . Follow-up with [name], MD as planned . Schedule eye exam for after 04/29/2019.  . At your convenience, please bring a copy of your Advance Directives (Healthcare Power of Attorney and Living Will) to our office to be filed in your medical record. . Work on your goal of maintaining your hemoglobin A1c less than 7%.    I have personally reviewed and noted the following in the patient's chart:   . Medical and social history . Use of alcohol, tobacco or illicit drugs  . Current medications and supplements . Functional ability and status . Nutritional status . Physical activity . Advanced directives . List of other physicians . Hospitalizations, surgeries, and ER visits in previous 12 months . Vitals . Screenings to include cognitive, depression, and falls . Referrals and appointments  In addition, I have reviewed and discussed with [name] certain preventive protocols, quality metrics, and best practice recommendations. A written personalized care plan for preventive services as well as general preventive health recommendations is available and can be mailed to the patient at his request.      Nolberto Hanlon, RN 04/26/2019

## 2019-04-26 NOTE — Patient Instructions (Signed)
  Billy Nolan , Thank you for taking time to talk with me for your Medicare Wellness Visit. I appreciate your ongoing commitment to your health goals. Please review the following plan we discussed and let me know if I can assist you in the future.   These are the goals we discussed: Goals    . HEMOGLOBIN A1C < 7.0     Continue to monitor carbohydrate intake, exercise, and taking diabetes medications regularly.        This is a list of the screening recommended for you and due dates:  Health Maintenance  Topic Date Due  .  Hepatitis C: One time screening is recommended by Center for Disease Control  (CDC) for  adults born from [birth year] through 1963.   1946-03-30  . Complete foot exam   05/19/2018  . Tetanus Vaccine  11/01/2019*  . Eye exam for diabetics  04/29/2019  . Hemoglobin A1C  05/01/2019  . Flu Shot  07/07/2019  . Colon Cancer Screening  09/12/2024  . Pneumonia vaccines  Completed  *Topic was postponed. The date shown is not the original due date.

## 2019-07-23 ENCOUNTER — Encounter: Payer: Self-pay | Admitting: Family Medicine

## 2019-07-23 ENCOUNTER — Ambulatory Visit (INDEPENDENT_AMBULATORY_CARE_PROVIDER_SITE_OTHER): Payer: PPO | Admitting: Family Medicine

## 2019-07-23 DIAGNOSIS — E1141 Type 2 diabetes mellitus with diabetic mononeuropathy: Secondary | ICD-10-CM | POA: Diagnosis not present

## 2019-07-23 NOTE — Progress Notes (Signed)
    Subjective:    Patient ID: Billy Nolan, male    DOB: May 20, 1946, 73 y.o.   MRN: [id number]   HPI: Billy Nolan is a 73 y.o. male presenting for follow up of diabetes. Wife states his glucose is high due to excessive carbs in the diet. Eating bbread, jelly, corn and potatoes. Doing some walking. - 1/4 mile a day 123/75, 68, 170 lb, today Glucose 182 fasting yesterday, 162 today. Not checking often, but usually around this. Sometimes goes as low as 130. Depression screen [facility] 2/9 04/26/2019 10/31/2018 03/03/2018 11/21/2017 08/19/2017  Decreased Interest 0 0 0 0 0  Down, Depressed, Hopeless 0 0 0 0 0  PHQ - 2 Score 0 0 0 0 0     Relevant past medical, surgical, family and social history reviewed and updated as indicated.  Interim medical history since our last visit reviewed. Allergies and medications reviewed and updated.  ROS:  Review of Systems  Constitutional: Negative.   HENT: Negative.   Eyes: Negative for visual disturbance.  Respiratory: Negative for cough and shortness of breath.   Cardiovascular: Negative for chest pain and leg swelling.  Gastrointestinal: Negative for abdominal pain, diarrhea, nausea and vomiting.  Genitourinary: Negative for difficulty urinating.  Musculoskeletal: Negative for arthralgias and myalgias.  Skin: Negative for rash.  Neurological: Positive for numbness (occasional left shoulder and arm numbness, tingling to left thumb). Negative for headaches.  Psychiatric/Behavioral: Negative for sleep disturbance.     Social History   Tobacco Use  Smoking Status Never Smoker  Smokeless Tobacco Never Used       Objective:     Wt Readings from Last 3 Encounters:  10/31/18 176 lb 9.6 oz (80.1 kg)  04/25/18 178 lb 8 oz (81 kg)  03/03/18 179 lb 2 oz (81.3 kg)     Exam deferred. Pt. [place] due to COVID 19. Phone visit performed.   Assessment & Plan:   1. Type 2 diabetes mellitus with diabetic mononeuropathy, without long-term current use of  insulin ([place])     No orders of the defined types were placed in this encounter.   No orders of the defined types were placed in this encounter.     Diagnoses and all orders for this visit:  Type 2 diabetes mellitus with diabetic mononeuropathy, without long-term current use of insulin ([place])    Virtual Visit via telephone Note  I discussed the limitations, risks, security and privacy concerns of performing an evaluation and management service by telephone and the availability of in person appointments. The patient was identified with two identifiers. Pt.expressed understanding and agreed to proceed. Pt. Is at home. Dr. Livia Snellen is in his office.  Follow Up Instructions:   I discussed the assessment and treatment plan with the patient. The patient was provided an opportunity to ask questions and all were answered. The patient agreed with the plan and demonstrated an understanding of the instructions.   The patient was advised to call back or seek an in-person evaluation if the symptoms worsen or if the condition fails to improve as anticipated.   Total minutes including chart review and phone contact time: 20   Follow up plan: Return in about 3 months (around 10/23/2019) for diabetes.  [name], MD [place]

## 2019-08-30 ENCOUNTER — Other Ambulatory Visit: Payer: Self-pay | Admitting: Family Medicine

## 2019-11-18 ENCOUNTER — Other Ambulatory Visit: Payer: Self-pay | Admitting: Family Medicine

## 2019-11-18 DIAGNOSIS — E785 Hyperlipidemia, unspecified: Secondary | ICD-10-CM

## 2019-11-23 ENCOUNTER — Other Ambulatory Visit: Payer: Self-pay | Admitting: Family Medicine

## 2020-01-09 ENCOUNTER — Other Ambulatory Visit: Payer: Self-pay

## 2020-01-10 ENCOUNTER — Encounter: Payer: Self-pay | Admitting: Family Medicine

## 2020-01-10 ENCOUNTER — Ambulatory Visit (INDEPENDENT_AMBULATORY_CARE_PROVIDER_SITE_OTHER): Payer: PPO | Admitting: Family Medicine

## 2020-01-10 VITALS — BP 133/84 | HR 86 | Temp 97.6°F | Ht 68.0 in | Wt 175.6 lb

## 2020-01-10 DIAGNOSIS — K2100 Gastro-esophageal reflux disease with esophagitis, without bleeding: Secondary | ICD-10-CM | POA: Diagnosis not present

## 2020-01-10 DIAGNOSIS — N521 Erectile dysfunction due to diseases classified elsewhere: Secondary | ICD-10-CM

## 2020-01-10 DIAGNOSIS — E1141 Type 2 diabetes mellitus with diabetic mononeuropathy: Secondary | ICD-10-CM

## 2020-01-10 DIAGNOSIS — E785 Hyperlipidemia, unspecified: Secondary | ICD-10-CM

## 2020-01-10 DIAGNOSIS — Z125 Encounter for screening for malignant neoplasm of prostate: Secondary | ICD-10-CM

## 2020-01-10 LAB — BAYER DCA HB A1C WAIVED: HB A1C (BAYER DCA - WAIVED): 8.4 % — ABNORMAL HIGH (ref <7.0)

## 2020-01-10 MED ORDER — LISINOPRIL 10 MG PO TABS: 10.0000 mg | ORAL_TABLET | Freq: Every day | ORAL | 1 refills | Status: DC

## 2020-01-10 MED ORDER — SILDENAFIL CITRATE 20 MG PO TABS: ORAL_TABLET | ORAL | 5 refills | Status: DC

## 2020-01-10 MED ORDER — PANTOPRAZOLE SODIUM 40 MG PO TBEC: 40.0000 mg | DELAYED_RELEASE_TABLET | Freq: Every day | ORAL | 11 refills | Status: DC

## 2020-01-10 MED ORDER — FENOFIBRATE 145 MG PO TABS: 145.0000 mg | ORAL_TABLET | Freq: Every day | ORAL | 1 refills | Status: DC

## 2020-01-10 MED ORDER — METFORMIN HCL ER 500 MG PO TB24: 1000.0000 mg | ORAL_TABLET | Freq: Two times a day (BID) | ORAL | 0 refills | Status: DC

## 2020-01-10 MED ORDER — ROSUVASTATIN CALCIUM 40 MG PO TABS: ORAL_TABLET | ORAL | 0 refills | Status: DC

## 2020-01-10 MED ORDER — CHOLECALCIFEROL 25 MCG (1000 UT) PO CAPS: 1000.0000 [IU] | ORAL_CAPSULE | Freq: Every day | ORAL | 3 refills | Status: DC

## 2020-01-10 MED ORDER — NIACIN ER (ANTIHYPERLIPIDEMIC) 1000 MG PO TBCR: EXTENDED_RELEASE_TABLET | ORAL | 0 refills | Status: DC

## 2020-01-10 NOTE — Progress Notes (Signed)
Subjective:  Patient ID: Billy Nolan,  male    DOB: 09-21-1946  Age: 74 y.o.    CC: Follow-up (3 month)   HPI [name] presents for  follow-up of hypertension. Patient has no history of headache chest pain or shortness of breath or recent cough. Patient also denies symptoms of TIA such as numbness weakness lateralizing. Patient denies side effects from medication. States taking it regularly.  Patient also  in for follow-up of elevated cholesterol. Doing well without complaints on current medication. Denies side effects  including myalgia and arthralgia and nausea. Also in today for liver function testing. Currently no chest pain, shortness of breath or other cardiovascular related symptoms noted.  Follow-up of diabetes. Patient does check blood sugar at home. Readings run between 150 and 180 Patient denies symptoms such as excessive hunger or urinary frequency, excessive hunger, nausea No significant hypoglycemic spells noted. Medications reviewed. Pt reports taking them regularly. Pt. denies complication/adverse reaction today.    History [name] has a past medical history of Allergy, Diabetes mellitus without complication ([place]), GERD (gastroesophageal reflux disease), Hyperlipidemia, and Hypertension.   He has a past surgical history that includes dental surgeries and Lumbar laminectomy/decompression microdiscectomy (Left, 01/30/2016).   His family history includes Cancer in his sister; Heart disease in his father and mother; Multiple sclerosis in his sister; Stroke in his father; Ulcerative colitis in his son.He reports that he has never smoked. He has never used smokeless tobacco. He reports that he does not drink alcohol or use drugs.  Current Outpatient Medications on File Prior to Visit  Medication Sig Dispense Refill  . Glucosamine-Chondroit-Vit C-Mn (GLUCOSAMINE 1500 COMPLEX PO) Take 2 capsules by mouth every evening.     Marland Kitchen glucose blood (ONETOUCH VERIO) test strip USE 1  STRIP TO CHECK GLUCOSE TWICE DAILY 300 each 2  . Magnesium 400 MG CAPS Take 1 capsule by mouth at bedtime.      No current facility-administered medications on file prior to visit.    ROS Review of Systems  Constitutional: Negative for fever.  Respiratory: Negative for shortness of breath.   Cardiovascular: Negative for chest pain.  Musculoskeletal: Negative for arthralgias.  Skin: Negative for rash.    Objective:  BP 133/84   Pulse 86   Temp 97.6 F (36.4 C) (Temporal)   Ht 5' 8"  (1.727 m)   Wt 175 lb 9.6 oz (79.7 kg)   BMI 26.70 kg/m   BP Readings from Last 3 Encounters:  01/10/20 133/84  10/31/18 133/89  04/25/18 123/74    Wt Readings from Last 3 Encounters:  01/10/20 175 lb 9.6 oz (79.7 kg)  10/31/18 176 lb 9.6 oz (80.1 kg)  04/25/18 178 lb 8 oz (81 kg)     Physical Exam Vitals reviewed.  Constitutional:      Appearance: He is well-developed.  HENT:     Head: Normocephalic and atraumatic.     Right Ear: External ear normal.     Left Ear: External ear normal.     Mouth/Throat:     Pharynx: No oropharyngeal exudate or posterior oropharyngeal erythema.  Eyes:     Pupils: Pupils are equal, round, and reactive to light.  Cardiovascular:     Rate and Rhythm: Normal rate and regular rhythm.     Heart sounds: No murmur.  Pulmonary:     Effort: No respiratory distress.     Breath sounds: Normal breath sounds.  Musculoskeletal:     Cervical back: Normal range of motion and  neck supple.  Neurological:     Mental Status: He is alert and oriented to person, place, and time.     Diabetic Foot Exam - Simple   No data filed        Assessment & Plan:   [place] was seen today for follow-up.  Diagnoses and all orders for this visit:  Type 2 diabetes mellitus with diabetic mononeuropathy, without long-term current use of insulin (HCC) -     CBC with Differential/Platelet -     CMP14+EGFR -     Microalbumin / creatinine urine ratio -     Bayer DCA Hb A1c  Waived -     Cholecalciferol 25 MCG (1000 UT) capsule; Take 1 capsule (1,000 Units total) by mouth daily. -     lisinopril (ZESTRIL) 10 MG tablet; Take 1 tablet (10 mg total) by mouth daily. (Please make 6 mos med ckup) -     metFORMIN (GLUCOPHAGE-XR) 500 MG 24 hr tablet; Take 2 tablets (1,000 mg total) by mouth 2 (two) times daily. -     niacin (NIASPAN) 1000 MG CR tablet; TAKE 1 TABLET BY MOUTH ONCE DAILY WITH BREAKFAST (NEEDS  TO  BE  SEEN  FOR  REFILLS) -     rosuvastatin (CRESTOR) 40 MG tablet; TAKE 1/2 (ONE-HALF) TABLET BY MOUTH ONCE DAILY.  Needs to be seen for further refills. -     sildenafil (REVATIO) 20 MG tablet; Take 2 to 5 daily as needed for intimacy  Hyperlipidemia, unspecified hyperlipidemia type -     CBC with Differential/Platelet -     CMP14+EGFR -     Lipid panel -     Bayer DCA Hb A1c Waived -     niacin (NIASPAN) 1000 MG CR tablet; TAKE 1 TABLET BY MOUTH ONCE DAILY WITH BREAKFAST (NEEDS  TO  BE  SEEN  FOR  REFILLS) -     rosuvastatin (CRESTOR) 40 MG tablet; TAKE 1/2 (ONE-HALF) TABLET BY MOUTH ONCE DAILY.  Needs to be seen for further refills. -     fenofibrate (TRICOR) 145 MG tablet; Take 1 tablet (145 mg total) by mouth daily.  Prostate cancer screening -     PSA, total and free  Erectile dysfunction due to diseases classified elsewhere -     sildenafil (REVATIO) 20 MG tablet; Take 2 to 5 daily as needed for intimacy  Gastroesophageal reflux disease with esophagitis without hemorrhage -     pantoprazole (PROTONIX) 40 MG tablet; Take 1 tablet (40 mg total) by mouth daily. For stomach   I have changed [name]'s Cholecalciferol, metFORMIN, and sildenafil. I am also having him start on pantoprazole. Additionally, I am having him maintain his Glucosamine-Chondroit-Vit C-Mn (GLUCOSAMINE 1500 COMPLEX PO), Magnesium, glucose blood, lisinopril, niacin, rosuvastatin, and fenofibrate.  Meds ordered this encounter  Medications  . Cholecalciferol 25 MCG (1000 UT)  capsule    Sig: Take 1 capsule (1,000 Units total) by mouth daily.    Dispense:  90 capsule    Refill:  3  . lisinopril (ZESTRIL) 10 MG tablet    Sig: Take 1 tablet (10 mg total) by mouth daily. (Please make 6 mos med ckup)    Dispense:  90 tablet    Refill:  1  . metFORMIN (GLUCOPHAGE-XR) 500 MG 24 hr tablet    Sig: Take 2 tablets (1,000 mg total) by mouth 2 (two) times daily.    Dispense:  360 tablet    Refill:  0  . niacin (NIASPAN) 1000  MG CR tablet    Sig: TAKE 1 TABLET BY MOUTH ONCE DAILY WITH BREAKFAST (NEEDS  TO  BE  SEEN  FOR  REFILLS)    Dispense:  30 tablet    Refill:  0  . rosuvastatin (CRESTOR) 40 MG tablet    Sig: TAKE 1/2 (ONE-HALF) TABLET BY MOUTH ONCE DAILY.  Needs to be seen for further refills.    Dispense:  15 tablet    Refill:  0  . sildenafil (REVATIO) 20 MG tablet    Sig: Take 2 to 5 daily as needed for intimacy    Dispense:  60 tablet    Refill:  5  . fenofibrate (TRICOR) 145 MG tablet    Sig: Take 1 tablet (145 mg total) by mouth daily.    Dispense:  90 tablet    Refill:  1  . pantoprazole (PROTONIX) 40 MG tablet    Sig: Take 1 tablet (40 mg total) by mouth daily. For stomach    Dispense:  30 tablet    Refill:  11     Follow-up: Return in about 3 months (around 04/08/2020).  [name], M.D.

## 2020-01-11 LAB — CBC WITH DIFFERENTIAL/PLATELET
Basophils Absolute: 0.1 10*3/uL (ref 0.0–0.2)
Basos: 1 %
EOS (ABSOLUTE): 0.2 10*3/uL (ref 0.0–0.4)
Eos: 4 %
Hematocrit: 45.9 % (ref 37.5–51.0)
Hemoglobin: 15.6 g/dL (ref 13.0–17.7)
Immature Grans (Abs): 0 10*3/uL (ref 0.0–0.1)
Immature Granulocytes: 0 %
Lymphocytes Absolute: 1.7 10*3/uL (ref 0.7–3.1)
Lymphs: 30 %
MCH: 32.3 pg (ref 26.6–33.0)
MCHC: 34 g/dL (ref 31.5–35.7)
MCV: 95 fL (ref 79–97)
Monocytes Absolute: 0.5 10*3/uL (ref 0.1–0.9)
Monocytes: 8 %
Neutrophils Absolute: 3.2 10*3/uL (ref 1.4–7.0)
Neutrophils: 57 %
Platelets: 215 10*3/uL (ref 150–450)
RBC: 4.83 x10E6/uL (ref 4.14–5.80)
RDW: 12.6 % (ref 11.6–15.4)
WBC: 5.7 10*3/uL (ref 3.4–10.8)

## 2020-01-11 LAB — CMP14+EGFR
ALT: 25 IU/L (ref 0–44)
AST: 16 IU/L (ref 0–40)
Albumin/Globulin Ratio: 2.4 — ABNORMAL HIGH (ref 1.2–2.2)
Albumin: 4.8 g/dL — ABNORMAL HIGH (ref 3.7–4.7)
Alkaline Phosphatase: 57 IU/L (ref 39–117)
BUN/Creatinine Ratio: 11 (ref 10–24)
BUN: 10 mg/dL (ref 8–27)
Bilirubin Total: 0.7 mg/dL (ref 0.0–1.2)
CO2: 23 mmol/L (ref 20–29)
Calcium: 10.5 mg/dL — ABNORMAL HIGH (ref 8.6–10.2)
Chloride: 104 mmol/L (ref 96–106)
Creatinine, Ser: 0.89 mg/dL (ref 0.76–1.27)
GFR calc Af Amer: 99 mL/min/{1.73_m2} (ref >59)
GFR calc non Af Amer: 85 mL/min/{1.73_m2} (ref >59)
Globulin, Total: 2 g/dL (ref 1.5–4.5)
Glucose: 192 mg/dL — ABNORMAL HIGH (ref 65–99)
Potassium: 4.8 mmol/L (ref 3.5–5.2)
Sodium: 144 mmol/L (ref 134–144)
Total Protein: 6.8 g/dL (ref 6.0–8.5)

## 2020-01-11 LAB — MICROALBUMIN / CREATININE URINE RATIO
Creatinine, Urine: 87.7 mg/dL
Microalb/Creat Ratio: 10 mg/g creat (ref 0–29)
Microalbumin, Urine: 9.2 ug/mL

## 2020-01-11 LAB — LIPID PANEL
Chol/HDL Ratio: 4.2 ratio (ref 0.0–5.0)
Cholesterol, Total: 127 mg/dL (ref 100–199)
HDL: 30 mg/dL — ABNORMAL LOW (ref >39)
LDL Chol Calc (NIH): 46 mg/dL (ref 0–99)
Triglycerides: 334 mg/dL — ABNORMAL HIGH (ref 0–149)
VLDL Cholesterol Cal: 51 mg/dL — ABNORMAL HIGH (ref 5–40)

## 2020-01-11 LAB — PSA, TOTAL AND FREE
PSA, Free Pct: 23.8 %
PSA, Free: 0.19 ng/mL
Prostate Specific Ag, Serum: 0.8 ng/mL (ref 0.0–4.0)

## 2020-01-13 ENCOUNTER — Encounter: Payer: Self-pay | Admitting: Family Medicine

## 2020-02-09 ENCOUNTER — Other Ambulatory Visit: Payer: Self-pay | Admitting: Family Medicine

## 2020-02-09 DIAGNOSIS — E1141 Type 2 diabetes mellitus with diabetic mononeuropathy: Secondary | ICD-10-CM

## 2020-02-09 DIAGNOSIS — E785 Hyperlipidemia, unspecified: Secondary | ICD-10-CM

## 2020-04-08 ENCOUNTER — Other Ambulatory Visit: Payer: Self-pay

## 2020-04-08 ENCOUNTER — Ambulatory Visit (INDEPENDENT_AMBULATORY_CARE_PROVIDER_SITE_OTHER): Payer: PPO | Admitting: Family Medicine

## 2020-04-08 ENCOUNTER — Encounter: Payer: Self-pay | Admitting: Family Medicine

## 2020-04-08 VITALS — BP 133/89 | HR 98 | Temp 97.7°F | Ht 68.0 in | Wt 171.8 lb

## 2020-04-08 DIAGNOSIS — K2100 Gastro-esophageal reflux disease with esophagitis, without bleeding: Secondary | ICD-10-CM | POA: Diagnosis not present

## 2020-04-08 DIAGNOSIS — E785 Hyperlipidemia, unspecified: Secondary | ICD-10-CM | POA: Diagnosis not present

## 2020-04-08 DIAGNOSIS — E1141 Type 2 diabetes mellitus with diabetic mononeuropathy: Secondary | ICD-10-CM | POA: Diagnosis not present

## 2020-04-08 LAB — CBC WITH DIFFERENTIAL/PLATELET
Basophils Absolute: 0 10*3/uL (ref 0.0–0.2)
Basos: 1 %
EOS (ABSOLUTE): 0.2 10*3/uL (ref 0.0–0.4)
Eos: 4 %
Hematocrit: 46.2 % (ref 37.5–51.0)
Hemoglobin: 15.5 g/dL (ref 13.0–17.7)
Immature Grans (Abs): 0 10*3/uL (ref 0.0–0.1)
Immature Granulocytes: 0 %
Lymphocytes Absolute: 1.8 10*3/uL (ref 0.7–3.1)
Lymphs: 39 %
MCH: 31.9 pg (ref 26.6–33.0)
MCHC: 33.5 g/dL (ref 31.5–35.7)
MCV: 95 fL (ref 79–97)
Monocytes Absolute: 0.4 10*3/uL (ref 0.1–0.9)
Monocytes: 8 %
Neutrophils Absolute: 2.2 10*3/uL (ref 1.4–7.0)
Neutrophils: 48 %
Platelets: 246 10*3/uL (ref 150–450)
RBC: 4.86 x10E6/uL (ref 4.14–5.80)
RDW: 12.4 % (ref 11.6–15.4)
WBC: 4.6 10*3/uL (ref 3.4–10.8)

## 2020-04-08 LAB — CMP14+EGFR
ALT: 13 IU/L (ref 0–44)
AST: 13 IU/L (ref 0–40)
Albumin/Globulin Ratio: 2 (ref 1.2–2.2)
Albumin: 4.5 g/dL (ref 3.7–4.7)
Alkaline Phosphatase: 57 IU/L (ref 39–117)
BUN/Creatinine Ratio: 15 (ref 10–24)
BUN: 14 mg/dL (ref 8–27)
Bilirubin Total: 0.7 mg/dL (ref 0.0–1.2)
CO2: 21 mmol/L (ref 20–29)
Calcium: 10 mg/dL (ref 8.6–10.2)
Chloride: 104 mmol/L (ref 96–106)
Creatinine, Ser: 0.92 mg/dL (ref 0.76–1.27)
GFR calc Af Amer: 96 mL/min/{1.73_m2} (ref >59)
GFR calc non Af Amer: 83 mL/min/{1.73_m2} (ref >59)
Globulin, Total: 2.2 g/dL (ref 1.5–4.5)
Glucose: 207 mg/dL — ABNORMAL HIGH (ref 65–99)
Potassium: 5.3 mmol/L — ABNORMAL HIGH (ref 3.5–5.2)
Sodium: 142 mmol/L (ref 134–144)
Total Protein: 6.7 g/dL (ref 6.0–8.5)

## 2020-04-08 LAB — BAYER DCA HB A1C WAIVED: HB A1C (BAYER DCA - WAIVED): 8.5 % — ABNORMAL HIGH (ref <7.0)

## 2020-04-08 MED ORDER — LISINOPRIL 10 MG PO TABS: 10.0000 mg | ORAL_TABLET | Freq: Every day | ORAL | 1 refills | Status: DC

## 2020-04-08 MED ORDER — ROSUVASTATIN CALCIUM 40 MG PO TABS: ORAL_TABLET | ORAL | 5 refills | Status: DC

## 2020-04-08 MED ORDER — SITAGLIPTIN PHOSPHATE 100 MG PO TABS: 100.0000 mg | ORAL_TABLET | Freq: Every day | ORAL | 2 refills | Status: DC

## 2020-04-08 MED ORDER — PANTOPRAZOLE SODIUM 40 MG PO TBEC: 40.0000 mg | DELAYED_RELEASE_TABLET | Freq: Every day | ORAL | 11 refills | Status: DC

## 2020-04-08 MED ORDER — METFORMIN HCL ER 500 MG PO TB24: 1000.0000 mg | ORAL_TABLET | Freq: Two times a day (BID) | ORAL | 1 refills | Status: DC

## 2020-04-08 NOTE — Progress Notes (Signed)
Subjective:  Patient ID: Billy Nolan,  male    DOB: February 01, 1946  Age: 74 y.o.    CC: Follow-up (3 month)   HPI Billy Nolan presents for  follow-up of hypertension. Patient has no history of headache chest pain or shortness of breath or recent cough. Patient also denies symptoms of TIA such as numbness weakness lateralizing. Patient denies side effects from medication. States taking it regularly.  Patient also  in for follow-up of elevated cholesterol. Doing well without complaints on current medication. Denies side effects  including myalgia and arthralgia and nausea. Also in today for liver function testing. Currently no chest pain, shortness of breath or other cardiovascular related symptoms noted.  Follow-up of diabetes. Patient does check blood sugar at home. Readings run a little high Patient denies symptoms such as excessive hunger or urinary frequency, excessive hunger, nausea No significant hypoglycemic spells noted. Medications reviewed. Pt reports taking them regularly. Pt. denies complication/adverse reaction today.  Pt. Is eating bread, but avoiding starches otherwise except for occasional potatoes. Exercises [place] daily.   History Billy Nolan has a past medical history of Allergy, Diabetes mellitus without complication ([place]), GERD (gastroesophageal reflux disease), Hyperlipidemia, and Hypertension.   He has a past surgical history that includes dental surgeries and Lumbar laminectomy/decompression microdiscectomy (Left, 01/30/2016).   His family history includes Cancer in his sister; Heart disease in his father and mother; Multiple sclerosis in his sister; Stroke in his father; Ulcerative colitis in his son.He reports that he has never smoked. He has never used smokeless tobacco. He reports that he does not drink alcohol or use drugs.  Current Outpatient Medications on File Prior to Visit  Medication Sig Dispense Refill  . Cholecalciferol 25 MCG (1000 UT)  capsule Take 1 capsule (1,000 Units total) by mouth daily. 90 capsule 3  . fenofibrate (TRICOR) 145 MG tablet Take 1 tablet (145 mg total) by mouth daily. 90 tablet 1  . Glucosamine-Chondroit-Vit C-Mn (GLUCOSAMINE 1500 COMPLEX PO) Take 2 capsules by mouth every evening.     Marland Kitchen glucose blood (ONETOUCH VERIO) test strip USE 1 STRIP TO CHECK GLUCOSE TWICE DAILY 300 each 2  . Magnesium 400 MG CAPS Take 1 capsule by mouth at bedtime.     . niacin (NIASPAN) 1000 MG CR tablet TAKE 1 TABLET BY MOUTH ONCE DAILY WITH BREAKFAST . 30 tablet 5  . sildenafil (REVATIO) 20 MG tablet Take 2 to 5 daily as needed for intimacy 60 tablet 5   No current facility-administered medications on file prior to visit.    ROS Review of Systems  Constitutional: Negative.   HENT: Negative.   Eyes: Negative for visual disturbance.  Respiratory: Negative for cough and shortness of breath.   Cardiovascular: Negative for chest pain and leg swelling.  Gastrointestinal: Negative for abdominal pain, diarrhea, nausea and vomiting.  Genitourinary: Negative for difficulty urinating.  Musculoskeletal: Negative for arthralgias and myalgias.  Skin: Negative for rash.  Neurological: Negative for headaches.  Psychiatric/Behavioral: Negative for sleep disturbance.    Objective:  BP 133/89   Pulse 98   Temp 97.7 F (36.5 C) (Temporal)   Ht 5' 8"  (1.727 m)   Wt 171 lb 12.8 oz (77.9 kg)   BMI 26.12 kg/m   BP Readings from Last 3 Encounters:  04/08/20 133/89  01/10/20 133/84  10/31/18 133/89    Wt Readings from Last 3 Encounters:  04/08/20 171 lb 12.8 oz (77.9 kg)  01/10/20 175 lb 9.6 oz (79.7 kg)  10/31/18 176 lb 9.6 oz (80.1 kg)     Physical Exam Constitutional:      General: He is not in acute distress.    Appearance: He is well-developed.  HENT:     Head: Normocephalic and atraumatic.     Right Ear: External ear normal.     Left Ear: External ear normal.     Nose: Nose normal.  Eyes:     Conjunctiva/sclera:  Conjunctivae normal.     Pupils: Pupils are equal, round, and reactive to light.  Cardiovascular:     Rate and Rhythm: Normal rate and regular rhythm.     Heart sounds: Normal heart sounds. No murmur.  Pulmonary:     Effort: Pulmonary effort is normal. No respiratory distress.     Breath sounds: Normal breath sounds. No wheezing or rales.  Abdominal:     Palpations: Abdomen is soft.     Tenderness: There is no abdominal tenderness.  Musculoskeletal:        General: Normal range of motion.     Cervical back: Normal range of motion and neck supple.  Skin:    General: Skin is warm and dry.  Neurological:     Mental Status: He is alert and oriented to person, place, and time.     Deep Tendon Reflexes: Reflexes are normal and symmetric.  Psychiatric:        Behavior: Behavior normal.        Thought Content: Thought content normal.        Judgment: Judgment normal.     Diabetic Foot Exam - Simple   Simple Foot Form Diabetic Foot exam was performed with the following findings: Yes 04/08/2020  9:00 AM  Visual Inspection No deformities, no ulcerations, no other skin breakdown bilaterally: Yes Sensation Testing Intact to touch and monofilament testing bilaterally: Yes Pulse Check Posterior Tibialis and Dorsalis pulse intact bilaterally: Yes Comments       Assessment & Plan:   [name] was seen today for follow-up.  Diagnoses and all orders for this visit:  Type 2 diabetes mellitus with diabetic mononeuropathy, without long-term current use of insulin (HCC) -     CMP14+EGFR -     CBC with Differential/Platelet -     Bayer DCA Hb A1c Waived -     rosuvastatin (CRESTOR) 40 MG tablet; TAKE 1/2 (ONE-HALF) TABLET BY MOUTH ONCE DAILY . -     lisinopril (ZESTRIL) 10 MG tablet; Take 1 tablet (10 mg total) by mouth daily. -     metFORMIN (GLUCOPHAGE-XR) 500 MG 24 hr tablet; Take 2 tablets (1,000 mg total) by mouth 2 (two) times daily.  Hyperlipidemia, unspecified hyperlipidemia type -      CMP14+EGFR -     CBC with Differential/Platelet -     rosuvastatin (CRESTOR) 40 MG tablet; TAKE 1/2 (ONE-HALF) TABLET BY MOUTH ONCE DAILY .  Gastroesophageal reflux disease with esophagitis without hemorrhage -     CMP14+EGFR -     CBC with Differential/Platelet -     pantoprazole (PROTONIX) 40 MG tablet; Take 1 tablet (40 mg total) by mouth daily. For stomach  Other orders -     sitaGLIPtin (JANUVIA) 100 MG tablet; Take 1 tablet (100 mg total) by mouth daily.   I have changed Arless Charney's lisinopril. I am also having him start on sitaGLIPtin. Additionally, I am having him maintain his Glucosamine-Chondroit-Vit C-Mn (GLUCOSAMINE 1500 COMPLEX PO), Magnesium, glucose blood, Cholecalciferol, sildenafil, fenofibrate, niacin, rosuvastatin, metFORMIN, and pantoprazole.  Meds ordered  this encounter  Medications  . sitaGLIPtin (JANUVIA) 100 MG tablet    Sig: Take 1 tablet (100 mg total) by mouth daily.    Dispense:  30 tablet    Refill:  2  . rosuvastatin (CRESTOR) 40 MG tablet    Sig: TAKE 1/2 (ONE-HALF) TABLET BY MOUTH ONCE DAILY .    Dispense:  15 tablet    Refill:  5  . lisinopril (ZESTRIL) 10 MG tablet    Sig: Take 1 tablet (10 mg total) by mouth daily.    Dispense:  90 tablet    Refill:  1  . metFORMIN (GLUCOPHAGE-XR) 500 MG 24 hr tablet    Sig: Take 2 tablets (1,000 mg total) by mouth 2 (two) times daily.    Dispense:  360 tablet    Refill:  1  . pantoprazole (PROTONIX) 40 MG tablet    Sig: Take 1 tablet (40 mg total) by mouth daily. For stomach    Dispense:  30 tablet    Refill:  11     Follow-up: Return in about 3 months (around 07/09/2020).  [name], M.D.

## 2020-04-09 NOTE — Progress Notes (Signed)
Hello Camari,  Your lab result is normal and/or stable.Some minor variations that are not significant are commonly marked abnormal, but do not represent any medical problem for you.  Best regards, Rockell Faulks, M.D.

## 2020-05-22 ENCOUNTER — Ambulatory Visit (INDEPENDENT_AMBULATORY_CARE_PROVIDER_SITE_OTHER): Payer: PPO | Admitting: *Deleted

## 2020-05-22 DIAGNOSIS — Z0000 Encounter for general adult medical examination without abnormal findings: Secondary | ICD-10-CM

## 2020-05-22 NOTE — Progress Notes (Signed)
MEDICARE ANNUAL WELLNESS VISIT  05/22/2020  Telephone Visit Disclaimer This Medicare AWV was conducted by telephone due to national recommendations for restrictions regarding the COVID-19 Pandemic (e.g. social distancing).  I verified, using two identifiers, that I am speaking with Billy Nolan or their authorized healthcare agent. I discussed the limitations, risks, security, and privacy concerns of performing an evaluation and management service by telephone and the potential availability of an in-person appointment in the future. The patient expressed understanding and agreed to proceed.   Subjective:  Billy Nolan is a 74 y.o. male patient of Stacks, Cletus Gash, MD who had a Medicare Annual Wellness Visit today via telephone. [name] is Retired and lives with their spouse. he has 3 children. he reports that he is not socially active and does interact with friends/family regularly. he is not physically active and enjoys working on cars and spending time with his wife..  Patient Care Team: [name], MD as PCP - General (Family Medicine)  Advanced Directives 05/22/2020 04/26/2019 01/30/2016 01/27/2016 09/12/2014 08/28/2014  Does Patient Have a Medical Advance Directive? Yes Yes Yes Yes Yes Yes  Type of Advance Directive Living will [place];Living will [place];Living will [place];Living will [place];Living will [place];Living will  Does patient want to make changes to medical advance directive? No - Patient declined No - Patient declined No - Patient declined No - Patient declined - -  Copy of [place] in Chart? - No - copy requested No - copy requested No - copy requested - No - copy requested  Would patient like information on creating a medical advance directive? - - - - - No - patient declined information    Hospital Utilization Over the Past 12 Months: # of  hospitalizations or ER visits: 0 # of surgeries: 0  Review of Systems    Patient reports that his overall health is unchanged compared to last year.  History obtained from chart review and the patient  Patient Reported Readings (BP, Pulse, CBG, Weight, etc) none  Pain Assessment Pain : No/denies pain     Current Medications & Allergies (verified) Allergies as of 05/22/2020      Reactions   Sulfa Antibiotics Rash      Medication List       Accurate as of May 22, 2020  8:46 AM. If you have any questions, ask your nurse or doctor.        Cholecalciferol 25 MCG (1000 UT) capsule Take 1 capsule (1,000 Units total) by mouth daily.   fenofibrate 145 MG tablet Commonly known as: TRICOR Take 1 tablet (145 mg total) by mouth daily.   GLUCOSAMINE 1500 COMPLEX PO Take 2 capsules by mouth every evening.   glucose blood test strip Commonly known as: OneTouch Verio USE 1 STRIP TO CHECK GLUCOSE TWICE DAILY   lisinopril 10 MG tablet Commonly known as: ZESTRIL Take 1 tablet (10 mg total) by mouth daily.   Magnesium 400 MG Caps Take 1 capsule by mouth at bedtime.   metFORMIN 500 MG 24 hr tablet Commonly known as: GLUCOPHAGE-XR Take 2 tablets (1,000 mg total) by mouth 2 (two) times daily.   niacin 1000 MG CR tablet Commonly known as: NIASPAN TAKE 1 TABLET BY MOUTH ONCE DAILY WITH BREAKFAST .   pantoprazole 40 MG tablet Commonly known as: PROTONIX Take 1 tablet (40 mg total) by mouth daily. For stomach   rosuvastatin 40 MG  tablet Commonly known as: CRESTOR TAKE 1/2 (ONE-HALF) TABLET BY MOUTH ONCE DAILY .   sildenafil 20 MG tablet Commonly known as: REVATIO Take 2 to 5 daily as needed for intimacy   sitaGLIPtin 100 MG tablet Commonly known as: Januvia Take 1 tablet (100 mg total) by mouth daily.       History (reviewed): Past Medical History:  Diagnosis Date  . Allergy   . Diabetes mellitus without complication ([place])   . GERD (gastroesophageal reflux  disease)   . Hyperlipidemia   . Hypertension    Past Surgical History:  Procedure Laterality Date  . dental surgeries     teeth extractions, root cannal  . LUMBAR LAMINECTOMY/DECOMPRESSION MICRODISCECTOMY Left 01/30/2016   Procedure: LUMBAR LAMINECTOMY/CENTRAL DECOMPRESSION MICRODISCECTOMY L3 - L4 ON THE LEFT;  Surgeon: Latanya Maudlin, MD;  Location: [facility];  Service: Orthopedics;  Laterality: Left;   Family History  Problem Relation Age of Onset  . Heart disease Mother   . Stroke Father   . Heart disease Father   . Multiple sclerosis Sister   . Cancer Sister   . Ulcerative colitis Son   . Colon cancer Neg Hx    Social History   Socioeconomic History  . Marital status: Married    Spouse name: Not on file  . Number of children: 3  . Years of education: 34  . Highest education level: Some college, no degree  Occupational History  . Occupation: Retired     Comment: Cytogeneticist  Tobacco Use  . Smoking status: Never Smoker  . Smokeless tobacco: Never Used  Vaping Use  . Vaping Use: Never used  Substance and Sexual Activity  . Alcohol use: No  . Drug use: No  . Sexual activity: Not on file  Other Topics Concern  . Not on file  Social History Narrative  . Not on file   Social Determinants of Health   Financial Resource Strain:   . Difficulty of Paying Living Expenses:   Food Insecurity:   . Worried About Charity fundraiser in the Last Year:   . Arboriculturist in the Last Year:   Transportation Needs:   . Film/video editor (Medical):   Marland Kitchen Lack of Transportation (Non-Medical):   Physical Activity:   . Days of Exercise per Week:   . Minutes of Exercise per Session:   Stress:   . Feeling of Stress :   Social Connections:   . Frequency of Communication with Friends and Family:   . Frequency of Social Gatherings with Friends and Family:   . Attends Religious Services:   . Active Member of Clubs or Organizations:   . Attends Archivist Meetings:    Marland Kitchen Marital Status:     Activities of Daily Living In your present state of health, do you have any difficulty performing the following activities: 05/22/2020  Hearing? N  Vision? N  Difficulty concentrating or making decisions? N  Walking or climbing stairs? N  Dressing or bathing? N  Doing errands, shopping? N  Preparing Food and eating ? N  Using the Toilet? N  In the past six months, have you accidently leaked urine? N  Do you have problems with loss of bowel control? N  Managing your Medications? N  Managing your Finances? N  Housekeeping or managing your Housekeeping? N  Some recent data might be hidden    Patient Education/ Literacy How often do you need to have someone help you when  you read instructions, pamphlets, or other written materials from your doctor or pharmacy?: 1 - Never What is the last grade level you completed in school?: 12  Exercise Current Exercise Habits: The patient does not participate in regular exercise at present  Diet Patient reports consuming 3 meals a day and 0 snack(s) a day Patient reports that his primary diet is: Regular Patient reports that she does have regular access to food.   Depression Screen PHQ 2/9 Scores 05/22/2020 04/08/2020 01/10/2020 04/26/2019 10/31/2018 03/03/2018 11/21/2017  PHQ - 2 Score 0 0 0 0 0 0 0     Fall Risk Fall Risk  05/22/2020 04/08/2020 01/10/2020 04/26/2019 10/31/2018  Falls in the past year? 0 0 0 0 0  Number falls in past yr: - 0 0 - -  Injury with Fall? - 0 0 - -  Risk for fall due to : - No Fall Risks No Fall Risks - -  Follow up - Falls evaluation completed Falls evaluation completed Falls prevention discussed -     Objective:  Billy Nolan seemed alert and oriented and he participated appropriately during our telephone visit.  Blood Pressure Weight BMI  BP Readings from Last 3 Encounters:  04/08/20 133/89  01/10/20 133/84  10/31/18 133/89   Wt Readings from Last 3 Encounters:  04/08/20 171 lb 12.8 oz  (77.9 kg)  01/10/20 175 lb 9.6 oz (79.7 kg)  10/31/18 176 lb 9.6 oz (80.1 kg)   BMI Readings from Last 1 Encounters:  04/08/20 26.12 kg/m    *Unable to obtain current vital signs, weight, and BMI due to telephone visit type  Hearing/Vision  . Skylur did not seem to have difficulty with hearing/understanding during the telephone conversation . Reports that he has not had a formal eye exam by an eye care professional within the past year . Reports that he has not had a formal hearing evaluation within the past year *Unable to fully assess hearing and vision during telephone visit type  Cognitive Function: 6CIT Screen 05/22/2020 04/26/2019  What Year? 0 points 0 points  What month? 0 points 0 points  What time? 0 points 0 points  Count back from 20 0 points 0 points  Months in reverse - 4 points  Repeat phrase - 0 points  Total Score - 4   (Normal:0-7, Significant for Dysfunction: >8)  Normal Cognitive Function Screening: Yes   Immunization & Health Maintenance Record Immunization History  Administered Date(s) Administered  . Influenza, High Dose Seasonal PF 11/01/2018  . Moderna SARS-COVID-2 Vaccination 12/27/2019, 02/01/2020  . Pneumococcal Conjugate-13 07/03/2014  . Pneumococcal Polysaccharide-23 03/03/2018    Health Maintenance  Topic Date Due  . Hepatitis C Screening  Never done  . OPHTHALMOLOGY EXAM  04/29/2019  . TETANUS/TDAP  01/09/2021 (Originally 12/06/2017)  . INFLUENZA VACCINE  07/06/2020  . HEMOGLOBIN A1C  10/09/2020  . FOOT EXAM  04/08/2021  . COLONOSCOPY  09/12/2024  . COVID-19 Vaccine  Completed  . PNA vac Low Risk Adult  Completed       Assessment  This is a routine wellness examination for Billy Nolan.  Health Maintenance: Due or Overdue Health Maintenance Due  Topic Date Due  . Hepatitis C Screening  Never done  . OPHTHALMOLOGY EXAM  04/29/2019    Billy Nolan does not need a referral for Community Assistance: Care Management:   no Social  Work:    no Prescription Assistance:  no Nutrition/Diabetes Education:  no   Plan:  Personalized Goals Goals Addressed  This Visit's Progress   . Prevent falls        Personalized Health Maintenance & Screening Recommendations  shingles vaccine  Lung Cancer Screening Recommended: no (Low Dose CT Chest recommended if Age 83-80 years, 30 pack-year currently smoking OR have quit w/in past 15 years) Hepatitis C Screening recommended: yes HIV Screening recommended: no  Advanced Directives: Written information was not prepared per patient's request.  Referrals & Orders No orders of the defined types were placed in this encounter.   Follow-up Plan . Follow-up with [name], MD as planned on 07/15/20. Marland Kitchen Health Maintenance up to date except shingles vaccine. . Pt is due for an eye exam and says he will get in the next month or so.Wears reading glasses only. . No hearing difficulty. . Pt is independent with all ADL's. . Pt has a living will, copy requested for our records. . Pt voices no healthcare concerns. . Pt states that he would not like to receive a copy of today's visit.   I have personally reviewed and noted the following in the patient's chart:   . Medical and social history . Use of alcohol, tobacco or illicit drugs  . Current medications and supplements . Functional ability and status . Nutritional status . Physical activity . Advanced directives . List of other physicians . Hospitalizations, surgeries, and ER visits in previous 12 months . Vitals . Screenings to include cognitive, depression, and falls . Referrals and appointments  In addition, I have reviewed and discussed with Billy Nolan certain preventive protocols, quality metrics, and best practice recommendations. A written personalized care plan for preventive services as well as general preventive health recommendations is available and can be mailed to the patient at his request.       Faylene Million Charmaine,LPN  [id number]/[id number]

## 2020-07-15 ENCOUNTER — Other Ambulatory Visit: Payer: Self-pay

## 2020-07-15 ENCOUNTER — Encounter: Payer: Self-pay | Admitting: Family Medicine

## 2020-07-15 ENCOUNTER — Ambulatory Visit (INDEPENDENT_AMBULATORY_CARE_PROVIDER_SITE_OTHER): Payer: PPO | Admitting: Family Medicine

## 2020-07-15 VITALS — BP 120/79 | HR 88 | Temp 97.7°F | Ht 68.0 in | Wt 171.0 lb

## 2020-07-15 DIAGNOSIS — E785 Hyperlipidemia, unspecified: Secondary | ICD-10-CM

## 2020-07-15 DIAGNOSIS — K2100 Gastro-esophageal reflux disease with esophagitis, without bleeding: Secondary | ICD-10-CM | POA: Diagnosis not present

## 2020-07-15 DIAGNOSIS — E1141 Type 2 diabetes mellitus with diabetic mononeuropathy: Secondary | ICD-10-CM | POA: Diagnosis not present

## 2020-07-15 LAB — CBC WITH DIFFERENTIAL/PLATELET
Basophils Absolute: 0.1 10*3/uL (ref 0.0–0.2)
Basos: 1 %
EOS (ABSOLUTE): 0.2 10*3/uL (ref 0.0–0.4)
Eos: 4 %
Hematocrit: 46.3 % (ref 37.5–51.0)
Hemoglobin: 15.9 g/dL (ref 13.0–17.7)
Immature Grans (Abs): 0 10*3/uL (ref 0.0–0.1)
Immature Granulocytes: 0 %
Lymphocytes Absolute: 1.7 10*3/uL (ref 0.7–3.1)
Lymphs: 35 %
MCH: 32.7 pg (ref 26.6–33.0)
MCHC: 34.3 g/dL (ref 31.5–35.7)
MCV: 95 fL (ref 79–97)
Monocytes Absolute: 0.4 10*3/uL (ref 0.1–0.9)
Monocytes: 7 %
Neutrophils Absolute: 2.6 10*3/uL (ref 1.4–7.0)
Neutrophils: 53 %
Platelets: 222 10*3/uL (ref 150–450)
RBC: 4.86 x10E6/uL (ref 4.14–5.80)
RDW: 12.8 % (ref 11.6–15.4)
WBC: 4.9 10*3/uL (ref 3.4–10.8)

## 2020-07-15 LAB — CMP14+EGFR
ALT: 21 IU/L (ref 0–44)
AST: 20 IU/L (ref 0–40)
Albumin/Globulin Ratio: 2.1 (ref 1.2–2.2)
Albumin: 4.7 g/dL (ref 3.7–4.7)
Alkaline Phosphatase: 54 IU/L (ref 48–121)
BUN/Creatinine Ratio: 12 (ref 10–24)
BUN: 12 mg/dL (ref 8–27)
Bilirubin Total: 1.1 mg/dL (ref 0.0–1.2)
CO2: 22 mmol/L (ref 20–29)
Calcium: 9.8 mg/dL (ref 8.6–10.2)
Chloride: 103 mmol/L (ref 96–106)
Creatinine, Ser: 1.03 mg/dL (ref 0.76–1.27)
GFR calc Af Amer: 84 mL/min/{1.73_m2} (ref >59)
GFR calc non Af Amer: 72 mL/min/{1.73_m2} (ref >59)
Globulin, Total: 2.2 g/dL (ref 1.5–4.5)
Glucose: 170 mg/dL — ABNORMAL HIGH (ref 65–99)
Potassium: 5 mmol/L (ref 3.5–5.2)
Sodium: 139 mmol/L (ref 134–144)
Total Protein: 6.9 g/dL (ref 6.0–8.5)

## 2020-07-15 LAB — LIPID PANEL
Chol/HDL Ratio: 4.2 ratio (ref 0.0–5.0)
Cholesterol, Total: 125 mg/dL (ref 100–199)
HDL: 30 mg/dL — ABNORMAL LOW (ref >39)
LDL Chol Calc (NIH): 58 mg/dL (ref 0–99)
Triglycerides: 230 mg/dL — ABNORMAL HIGH (ref 0–149)
VLDL Cholesterol Cal: 37 mg/dL (ref 5–40)

## 2020-07-15 LAB — BAYER DCA HB A1C WAIVED: HB A1C (BAYER DCA - WAIVED): 8 % — ABNORMAL HIGH (ref <7.0)

## 2020-07-15 MED ORDER — RYBELSUS 3 MG PO TABS: 3.0000 mg | ORAL_TABLET | Freq: Every morning | ORAL | 2 refills | Status: AC

## 2020-07-15 MED ORDER — FAMOTIDINE 20 MG PO TABS
20.0000 mg | ORAL_TABLET | Freq: Every day | ORAL | 3 refills | Status: DC
Start: 2020-07-15 — End: 2021-04-16

## 2020-07-15 NOTE — Progress Notes (Signed)
Subjective:  Patient ID: Billy Nolan,  male    DOB: 02/21/1946  Age: 74 y.o.    CC: Follow-up (3 month)   HPI Christophor Eick presents for  follow-up of   Patient also  in for follow-up of elevated cholesterol. Doing well without complaints on current medication. Denies side effects  including myalgia and arthralgia and nausea. Also in today for liver function testing. Currently no chest pain, shortness of breath or other cardiovascular related symptoms noted.  Follow-up of diabetes. Patient does check blood sugar at home. Readings tend to run on the high side.  Patient denies symptoms such as excessive hunger or urinary frequency, excessive hunger, nausea No significant hypoglycemic spells noted. Medications reviewed. Pt reports taking them regularly. Pt. denies complication/adverse reaction today.  Patient in for follow-up of GERD. Currently asymptomatic taking  PPI as needed.  This turns out to be just a couple of times a month.  There is no chest pain or heartburn. No hematemesis and no melena. No dysphagia or choking. Onset is remote. Progression is stable. Complicating factors, none.   History Billy Nolan has a past medical history of Allergy, Diabetes mellitus without complication ([place]), GERD (gastroesophageal reflux disease), Hyperlipidemia, and Hypertension.   He has a past surgical history that includes dental surgeries and Lumbar laminectomy/decompression microdiscectomy (Left, 01/30/2016).   His family history includes Cancer in his sister; Heart disease in his father and mother; Multiple sclerosis in his sister; Stroke in his father; Ulcerative colitis in his son.He reports that he has never smoked. He has never used smokeless tobacco. He reports that he does not drink alcohol and does not use drugs.  Current Outpatient Medications on File Prior to Visit  Medication Sig Dispense Refill  . Cholecalciferol 25 MCG (1000 UT) capsule Take 1 capsule (1,000 Units total) by mouth daily. 90  capsule 3  . fenofibrate (TRICOR) 145 MG tablet Take 1 tablet (145 mg total) by mouth daily. 90 tablet 1  . Glucosamine-Chondroit-Vit C-Mn (GLUCOSAMINE 1500 COMPLEX PO) Take 2 capsules by mouth every evening.     Marland Kitchen glucose blood (ONETOUCH VERIO) test strip USE 1 STRIP TO CHECK GLUCOSE TWICE DAILY 300 each 2  . lisinopril (ZESTRIL) 10 MG tablet Take 1 tablet (10 mg total) by mouth daily. 90 tablet 1  . Magnesium 400 MG CAPS Take 1 capsule by mouth at bedtime.     . metFORMIN (GLUCOPHAGE-XR) 500 MG 24 hr tablet Take 2 tablets (1,000 mg total) by mouth 2 (two) times daily. 360 tablet 1  . niacin (NIASPAN) 1000 MG CR tablet TAKE 1 TABLET BY MOUTH ONCE DAILY WITH BREAKFAST . 30 tablet 5  . rosuvastatin (CRESTOR) 40 MG tablet TAKE 1/2 (ONE-HALF) TABLET BY MOUTH ONCE DAILY . 15 tablet 5  . sildenafil (REVATIO) 20 MG tablet Take 2 to 5 daily as needed for intimacy 60 tablet 5   No current facility-administered medications on file prior to visit.    ROS Review of Systems  Constitutional: Negative for fever.  Respiratory: Negative for shortness of breath.   Cardiovascular: Negative for chest pain.  Musculoskeletal: Negative for arthralgias.  Skin: Negative for rash.    Objective:  BP 120/79   Pulse 88   Temp 97.7 F (36.5 C) (Temporal)   Ht _0  (1.727 m)   Wt 171 lb (77.6 kg)   BMI 26.00 kg/m   BP Readings from Last 3 Encounters:  07/15/20 120/79  04/08/20 133/89  01/10/20 133/84    Wt Readings from  Last 3 Encounters:  07/15/20 171 lb (77.6 kg)  04/08/20 171 lb 12.8 oz (77.9 kg)  01/10/20 175 lb 9.6 oz (79.7 kg)     Physical Exam Vitals reviewed.  Constitutional:      Appearance: He is well-developed.  HENT:     Head: Normocephalic and atraumatic.     Right Ear: Tympanic membrane and external ear normal. No decreased hearing noted.     Left Ear: Tympanic membrane and external ear normal. No decreased hearing noted.     Mouth/Throat:     Pharynx: No oropharyngeal exudate  or posterior oropharyngeal erythema.  Eyes:     Pupils: Pupils are equal, round, and reactive to light.  Cardiovascular:     Rate and Rhythm: Normal rate and regular rhythm.     Heart sounds: No murmur heard.   Pulmonary:     Effort: No respiratory distress.     Breath sounds: Normal breath sounds.  Abdominal:     General: Bowel sounds are normal.     Palpations: Abdomen is soft. There is no mass.     Tenderness: There is no abdominal tenderness.  Musculoskeletal:     Cervical back: Normal range of motion and neck supple.     Assessment & Plan:   Idriss was seen today for follow-up.  Diagnoses and all orders for this visit:  Type 2 diabetes mellitus with diabetic mononeuropathy, without long-term current use of insulin (HCC) -     CBC with Differential/Platelet -     CMP14+EGFR -     Lipid panel -     Bayer DCA Hb A1c Waived  Hyperlipidemia, unspecified hyperlipidemia type -     CBC with Differential/Platelet -     CMP14+EGFR -     Lipid panel  Gastroesophageal reflux disease with esophagitis without hemorrhage -     CBC with Differential/Platelet -     CMP14+EGFR -     Lipid panel  Other orders -     Semaglutide (RYBELSUS) 3 MG TABS; Take 3 mg by mouth in the morning. -     famotidine (PEPCID) 20 MG tablet; Take 1 tablet (20 mg total) by mouth daily. For reflux / heartburn   I have discontinued Coralyn Mark Studnicka's sitaGLIPtin and pantoprazole. I am also having him start on Rybelsus and famotidine. Additionally, I am having him maintain his Glucosamine-Chondroit-Vit C-Mn (GLUCOSAMINE 1500 COMPLEX PO), Magnesium, glucose blood, Cholecalciferol, sildenafil, fenofibrate, niacin, rosuvastatin, lisinopril, and metFORMIN.  Meds ordered this encounter  Medications  . Semaglutide (RYBELSUS) 3 MG TABS    Sig: Take 3 mg by mouth in the morning.    Dispense:  30 tablet    Refill:  2  . famotidine (PEPCID) 20 MG tablet    Sig: Take 1 tablet (20 mg total) by mouth daily. For  reflux / heartburn    Dispense:  90 tablet    Refill:  3     Follow-up: Return in about 3 months (around 10/15/2020) for diabetes.  [name], M.D.

## 2020-07-16 NOTE — Progress Notes (Signed)
Hello Arian,  Your lab result is normal and/or stable.Some minor variations that are not significant are commonly marked abnormal, but do not represent any medical problem for you.  Best regards, Karver Fadden, M.D.

## 2020-07-20 ENCOUNTER — Other Ambulatory Visit: Payer: Self-pay | Admitting: Family Medicine

## 2020-07-20 DIAGNOSIS — E785 Hyperlipidemia, unspecified: Secondary | ICD-10-CM

## 2020-07-25 ENCOUNTER — Other Ambulatory Visit: Payer: Self-pay

## 2020-07-25 ENCOUNTER — Ambulatory Visit (INDEPENDENT_AMBULATORY_CARE_PROVIDER_SITE_OTHER): Payer: PPO | Admitting: Pharmacist

## 2020-07-25 DIAGNOSIS — E119 Type 2 diabetes mellitus without complications: Secondary | ICD-10-CM

## 2020-07-25 MED ORDER — ONDANSETRON HCL 4 MG PO TABS: 4.0000 mg | ORAL_TABLET | Freq: Three times a day (TID) | ORAL | 1 refills | Status: DC | PRN

## 2020-07-25 NOTE — Progress Notes (Signed)
    07/25/2020 Name: Billy Nolan MRN: [id number] DOB: 04/15/1946   S:  61 yoF presents for diabetes evaluation, education, and management Patient was referred and last seen by Primary Care Provider on 07/15/20.  Insurance coverage/medication affordability: [employer] medicare  Patient reports adherence with medications. . Current diabetes medications include: metformin, rybelsus . Current hypertension medications include: lisinopril Goal 130/80 . Current hyperlipidemia medications include: fibrate, rosuvastatin, niacin   Patient denies hypoglycemic events.   Patient reported dietary habits: Eats 2-3 meals/day Discussed meal planning options and Plate method for healthy eating . Avoid sugary drinks and desserts . Incorporate balanced protein, non starchy veggies, 1 serving of carbohydrate with each meal . Increase water intake . Increase physical activity as able  Patient-reported exercise habits: n/a  O:  Lab Results  Component Value Date   HGBA1C 8.0 (H) 07/15/2020   Lipid Panel     Component Value Date/Time   CHOL 125 07/15/2020 0809   TRIG 230 (H) 07/15/2020 0809   TRIG 125 10/24/2013 0930   HDL 30 (L) 07/15/2020 0809   HDL 35 (L) 10/24/2013 0930   CHOLHDL 4.2 07/15/2020 0809   LDLCALC 58 07/15/2020 0809   LDLCALC 84 10/24/2013 0930     Home fasting blood sugars: n/a   2 hour post-meal/random blood sugars: n/a.   A/P:  Diabetes T2DM currently uncontrolled, A1c 8.0%. Patient is able to verbalize appropriate hypoglycemia management plan. Patient is adherent with medication.   -Continued GLP-1 Rybelsus (generic name semaglutide) 3mg  daily  -Plan to increase to 7mg  daily after starter pack complete  -application submitted for patient assistance through [employer]  -Continue metformin  -Extensively discussed pathophysiology of diabetes, recommended lifestyle interventions, dietary effects on blood sugar control  -Counseled on s/sx of and management of  hypoglycemia  -Next A1C anticipated 10/16/2020.   Written patient instructions provided.  Total time in face to face counseling 30 minutes.   Follow up PCP Clinic Visit ON 10/16/2020.    [name], PharmD, BCPS Clinical Pharmacist, [place]  [facility] Phone [phone]

## 2020-08-05 ENCOUNTER — Telehealth: Payer: Self-pay | Admitting: Pharmacist

## 2020-08-05 NOTE — Telephone Encounter (Signed)
Patient approved for medication assistance program (Rybelsus 7mg ).  Medication to ship to PCP office in 10-14 business days Ship by 08/22/20

## 2020-08-05 NOTE — Telephone Encounter (Signed)
Patient still experiencing GI side effects from Rybelsus He is having nausea/hiccups Encouraged patient to eat a few crackers in the AM with dose to see if this will help AM nausea Instructed patient to discontinue medication if he experiences abdominal pain; call PCP

## 2020-10-16 ENCOUNTER — Encounter: Payer: Self-pay | Admitting: Family Medicine

## 2020-10-16 ENCOUNTER — Ambulatory Visit (INDEPENDENT_AMBULATORY_CARE_PROVIDER_SITE_OTHER): Payer: PPO | Admitting: Family Medicine

## 2020-10-16 ENCOUNTER — Other Ambulatory Visit: Payer: Self-pay

## 2020-10-16 VITALS — BP 135/82 | HR 84 | Temp 96.9°F | Resp 20 | Ht 68.0 in | Wt 167.2 lb

## 2020-10-16 DIAGNOSIS — E785 Hyperlipidemia, unspecified: Secondary | ICD-10-CM

## 2020-10-16 DIAGNOSIS — M48062 Spinal stenosis, lumbar region with neurogenic claudication: Secondary | ICD-10-CM

## 2020-10-16 DIAGNOSIS — E119 Type 2 diabetes mellitus without complications: Secondary | ICD-10-CM | POA: Diagnosis not present

## 2020-10-16 LAB — BAYER DCA HB A1C WAIVED: HB A1C (BAYER DCA - WAIVED): 7 % — ABNORMAL HIGH (ref <7.0)

## 2020-10-16 LAB — LIPID PANEL

## 2020-10-16 NOTE — Progress Notes (Signed)
Subjective:  Patient ID: Billy Nolan,  male    DOB: [birth year]  Age: 74 y.o.    CC: Medical Management of Chronic Issues   HPI Billy Nolan presents for  follow-up of hypertension. Patient has no history of headache chest pain or shortness of breath or recent cough. Patient also denies symptoms of TIA such as numbness weakness lateralizing. Patient denies side effects from medication. States taking it regularly.  Patient also  in for follow-up of elevated cholesterol. Doing well without complaints on current medication. Denies side effects  including myalgia and arthralgia and nausea. Also in today for liver function testing. Currently no chest pain, shortness of breath or other cardiovascular related symptoms noted.  Follow-up of diabetes. Patient does check blood sugar at home. Readings run a little high - between 150 - 190.  Patient denies symptoms such as excessive hunger or urinary frequency, excessive hunger, nausea No significant hypoglycemic spells noted. Medications reviewed. Pt reports taking them regularly. Pt. denies complication/adverse reaction today.    History Billy Nolan has a past medical history of Allergy, Diabetes mellitus without complication ([place]), GERD (gastroesophageal reflux disease), Hyperlipidemia, and Hypertension.   Billy Nolan has a past surgical history that includes dental surgeries and Lumbar laminectomy/decompression microdiscectomy (Left, 01/30/2016).   His family history includes Cancer in his sister; Heart disease in his father and mother; Multiple sclerosis in his sister; Stroke in his father; Ulcerative colitis in his son.Billy Nolan reports that Billy Nolan has never smoked. Billy Nolan has never used smokeless tobacco. Billy Nolan reports that Billy Nolan does not drink alcohol and does not use drugs.  Current Outpatient Medications on File Prior to Visit  Medication Sig Dispense Refill  . Cholecalciferol 25 MCG (1000 UT) capsule Take 1 capsule (1,000 Units total) by mouth daily. 90 capsule 3  .  famotidine (PEPCID) 20 MG tablet Take 1 tablet (20 mg total) by mouth daily. For reflux / heartburn 90 tablet 3  . fenofibrate (TRICOR) 145 MG tablet Take 1 tablet by mouth once daily 90 tablet 1  . Glucosamine-Chondroit-Vit C-Mn (GLUCOSAMINE 1500 COMPLEX PO) Take 2 capsules by mouth every evening.     Marland Kitchen glucose blood (ONETOUCH VERIO) test strip USE 1 STRIP TO CHECK GLUCOSE TWICE DAILY 300 each 2  . lisinopril (ZESTRIL) 10 MG tablet Take 1 tablet (10 mg total) by mouth daily. 90 tablet 1  . Magnesium 400 MG CAPS Take 1 capsule by mouth at bedtime.     . metFORMIN (GLUCOPHAGE-XR) 500 MG 24 hr tablet Take 2 tablets (1,000 mg total) by mouth 2 (two) times daily. (Patient taking differently: Take 1,000 mg by mouth 2 (two) times daily. Patient taking 1 in the morning, 2 in the evening) 360 tablet 1  . niacin (NIASPAN) 1000 MG CR tablet TAKE 1 TABLET BY MOUTH ONCE DAILY WITH BREAKFAST . 30 tablet 5  . Niacin CR 1000 MG TBCR Take 1 tablet by mouth once daily with breakfast 90 tablet 3  . ondansetron (ZOFRAN) 4 MG tablet Take 1 tablet (4 mg total) by mouth every 8 (eight) hours as needed for nausea or vomiting. 20 tablet 1  . rosuvastatin (CRESTOR) 40 MG tablet TAKE 1/2 (ONE-HALF) TABLET BY MOUTH ONCE DAILY . 15 tablet 5  . sildenafil (REVATIO) 20 MG tablet Take 2 to 5 daily as needed for intimacy 60 tablet 5   No current facility-administered medications on file prior to visit.    ROS Review of Systems  Constitutional: Negative for fever.  Respiratory: Negative for shortness of breath.  Cardiovascular: Negative for chest pain.  Musculoskeletal: Positive for back pain (bilateral, lumbar. Chronic recurring. Had surgery in past with Dr. Gladstone Lighter. Pain slowing him down.). Negative for arthralgias.  Skin: Negative for rash.    Objective:  BP 135/82   Pulse 84   Temp (!) 96.9 F (36.1 C) (Temporal)   Resp 20   Ht 5' 8"  (1.727 m)   Wt 167 lb 4 oz (75.9 kg)   SpO2 99%   BMI 25.43 kg/m   BP  Readings from Last 3 Encounters:  10/16/20 135/82  07/15/20 120/79  04/08/20 133/89    Wt Readings from Last 3 Encounters:  10/16/20 167 lb 4 oz (75.9 kg)  07/15/20 171 lb (77.6 kg)  04/08/20 171 lb 12.8 oz (77.9 kg)     Physical Exam Vitals reviewed.  Constitutional:      Appearance: Billy Nolan is well-developed.  HENT:     Head: Normocephalic and atraumatic.     Right Ear: Tympanic membrane and external ear normal. No decreased hearing noted.     Left Ear: Tympanic membrane and external ear normal. No decreased hearing noted.     Mouth/Throat:     Pharynx: No oropharyngeal exudate or posterior oropharyngeal erythema.  Eyes:     Pupils: Pupils are equal, round, and reactive to light.  Cardiovascular:     Rate and Rhythm: Normal rate and regular rhythm.     Heart sounds: No murmur heard.   Pulmonary:     Effort: No respiratory distress.     Breath sounds: Normal breath sounds.  Abdominal:     General: Bowel sounds are normal.     Palpations: Abdomen is soft. There is no mass.     Tenderness: There is no abdominal tenderness.  Musculoskeletal:     Cervical back: Normal range of motion and neck supple.     Results for orders placed or performed in visit on 10/16/20  Bayer DCA Hb A1c Waived  Result Value Ref Range   HB A1C (BAYER DCA - WAIVED) 7.0 (H) <7.0 %  CBC with Differential/Platelet  Result Value Ref Range   WBC 5.6 3.4 - 10.8 x10E3/uL   RBC 4.90 4.14 - 5.80 x10E6/uL   Hemoglobin 16.2 13.0 - 17.7 g/dL   Hematocrit 46.8 37.5 - 51.0 %   MCV 96 79 - 97 fL   MCH 33.1 (H) 26.6 - 33.0 pg   MCHC 34.6 31 - 35 g/dL   RDW 12.9 11.6 - 15.4 %   Platelets 239 150 - 450 x10E3/uL   Neutrophils 50 Not Estab. %   Lymphs 37 Not Estab. %   Monocytes 8 Not Estab. %   Eos 4 Not Estab. %   Basos 1 Not Estab. %   Neutrophils Absolute 2.9 1.40 - 7.00 x10E3/uL   Lymphocytes Absolute 2.1 0 - 3 x10E3/uL   Monocytes Absolute 0.4 0 - 0 x10E3/uL   EOS (ABSOLUTE) 0.2 0.0 - 0.4 x10E3/uL     Basophils Absolute 0.1 0 - 0 x10E3/uL   Immature Granulocytes 0 Not Estab. %   Immature Grans (Abs) 0.0 0.0 - 0.1 x10E3/uL  CMP14+EGFR  Result Value Ref Range   Glucose 180 (H) 65 - 99 mg/dL   BUN 12 8 - 27 mg/dL   Creatinine, Ser 0.95 0.76 - 1.27 mg/dL   GFR calc non Af Amer 80 >59 mL/min/1.73   GFR calc Af Amer 92 >59 mL/min/1.73   BUN/Creatinine Ratio 13 10 - 24   Sodium 140 134 -  144 mmol/L   Potassium 4.9 3.5 - 5.2 mmol/L   Chloride 102 96 - 106 mmol/L   CO2 22 20 - 29 mmol/L   Calcium 10.3 (H) 8.6 - 10.2 mg/dL   Total Protein 7.2 6.0 - 8.5 g/dL   Albumin 4.9 (H) 3.7 - 4.7 g/dL   Globulin, Total 2.3 1.5 - 4.5 g/dL   Albumin/Globulin Ratio 2.1 1.2 - 2.2   Bilirubin Total 0.9 0.0 - 1.2 mg/dL   Alkaline Phosphatase 64 44 - 121 IU/L   AST 16 0 - 40 IU/L   ALT 19 0 - 44 IU/L  Lipid panel  Result Value Ref Range   Cholesterol, Total 138 100 - 199 mg/dL   Triglycerides 213 (H) 0 - 149 mg/dL   HDL 32 (L) >39 mg/dL   VLDL Cholesterol Cal 35 5 - 40 mg/dL   LDL Chol Calc (NIH) 71 0 - 99 mg/dL   Chol/HDL Ratio 4.3 0.0 - 5.0 ratio       Assessment & Plan:   Billy Nolan was seen today for medical management of chronic issues.  Diagnoses and all orders for this visit:  Type 2 diabetes mellitus without complication, without long-term current use of insulin (HCC) -     Bayer DCA Hb A1c Waived -     CBC with Differential/Platelet -     CMP14+EGFR -     Lipid panel  Hyperlipidemia, unspecified hyperlipidemia type -     Bayer DCA Hb A1c Waived -     CBC with Differential/Platelet -     CMP14+EGFR -     Lipid panel  Spinal stenosis, lumbar region, with neurogenic claudication -     Ambulatory referral to Orthopedics   I am having Billy Nolan maintain his Glucosamine-Chondroit-Vit C-Mn (GLUCOSAMINE 1500 COMPLEX PO), Magnesium, glucose blood, Cholecalciferol, sildenafil, niacin, rosuvastatin, lisinopril, metFORMIN, famotidine, Niacin CR, fenofibrate, and ondansetron.  No  orders of the defined types were placed in this encounter.    Follow-up: Return in about 3 months (around 01/16/2021).  [name], M.D.

## 2020-10-17 LAB — CBC WITH DIFFERENTIAL/PLATELET
Basophils Absolute: 0.1 10*3/uL (ref 0.0–0.2)
Basos: 1 %
EOS (ABSOLUTE): 0.2 10*3/uL (ref 0.0–0.4)
Eos: 4 %
Hematocrit: 46.8 % (ref 37.5–51.0)
Hemoglobin: 16.2 g/dL (ref 13.0–17.7)
Immature Grans (Abs): 0 10*3/uL (ref 0.0–0.1)
Immature Granulocytes: 0 %
Lymphocytes Absolute: 2.1 10*3/uL (ref 0.7–3.1)
Lymphs: 37 %
MCH: 33.1 pg — ABNORMAL HIGH (ref 26.6–33.0)
MCHC: 34.6 g/dL (ref 31.5–35.7)
MCV: 96 fL (ref 79–97)
Monocytes Absolute: 0.4 10*3/uL (ref 0.1–0.9)
Monocytes: 8 %
Neutrophils Absolute: 2.9 10*3/uL (ref 1.4–7.0)
Neutrophils: 50 %
Platelets: 239 10*3/uL (ref 150–450)
RBC: 4.9 x10E6/uL (ref 4.14–5.80)
RDW: 12.9 % (ref 11.6–15.4)
WBC: 5.6 10*3/uL (ref 3.4–10.8)

## 2020-10-17 LAB — LIPID PANEL
Chol/HDL Ratio: 4.3 ratio (ref 0.0–5.0)
Cholesterol, Total: 138 mg/dL (ref 100–199)
HDL: 32 mg/dL — ABNORMAL LOW (ref >39)
LDL Chol Calc (NIH): 71 mg/dL (ref 0–99)
Triglycerides: 213 mg/dL — ABNORMAL HIGH (ref 0–149)
VLDL Cholesterol Cal: 35 mg/dL (ref 5–40)

## 2020-10-17 LAB — CMP14+EGFR
ALT: 19 IU/L (ref 0–44)
AST: 16 IU/L (ref 0–40)
Albumin/Globulin Ratio: 2.1 (ref 1.2–2.2)
Albumin: 4.9 g/dL — ABNORMAL HIGH (ref 3.7–4.7)
Alkaline Phosphatase: 64 IU/L (ref 44–121)
BUN/Creatinine Ratio: 13 (ref 10–24)
BUN: 12 mg/dL (ref 8–27)
Bilirubin Total: 0.9 mg/dL (ref 0.0–1.2)
CO2: 22 mmol/L (ref 20–29)
Calcium: 10.3 mg/dL — ABNORMAL HIGH (ref 8.6–10.2)
Chloride: 102 mmol/L (ref 96–106)
Creatinine, Ser: 0.95 mg/dL (ref 0.76–1.27)
GFR calc Af Amer: 92 mL/min/{1.73_m2} (ref >59)
GFR calc non Af Amer: 80 mL/min/{1.73_m2} (ref >59)
Globulin, Total: 2.3 g/dL (ref 1.5–4.5)
Glucose: 180 mg/dL — ABNORMAL HIGH (ref 65–99)
Potassium: 4.9 mmol/L (ref 3.5–5.2)
Sodium: 140 mmol/L (ref 134–144)
Total Protein: 7.2 g/dL (ref 6.0–8.5)

## 2020-10-18 NOTE — Progress Notes (Signed)
Hello Tomaz,  Your lab result is normal and/or stable.Some minor variations that are not significant are commonly marked abnormal, but do not represent any medical problem for you.  Best regards, Adeel Guiffre, M.D.

## 2020-10-19 ENCOUNTER — Encounter: Payer: Self-pay | Admitting: Family Medicine

## 2020-10-21 DIAGNOSIS — H524 Presbyopia: Secondary | ICD-10-CM | POA: Diagnosis not present

## 2020-10-21 DIAGNOSIS — H5203 Hypermetropia, bilateral: Secondary | ICD-10-CM | POA: Diagnosis not present

## 2020-10-21 DIAGNOSIS — D3132 Benign neoplasm of left choroid: Secondary | ICD-10-CM | POA: Diagnosis not present

## 2020-10-21 DIAGNOSIS — H52223 Regular astigmatism, bilateral: Secondary | ICD-10-CM | POA: Diagnosis not present

## 2020-10-21 DIAGNOSIS — E119 Type 2 diabetes mellitus without complications: Secondary | ICD-10-CM | POA: Diagnosis not present

## 2020-10-21 DIAGNOSIS — H43393 Other vitreous opacities, bilateral: Secondary | ICD-10-CM | POA: Diagnosis not present

## 2020-10-21 DIAGNOSIS — H2513 Age-related nuclear cataract, bilateral: Secondary | ICD-10-CM | POA: Diagnosis not present

## 2020-10-21 LAB — HM DIABETES EYE EXAM

## 2020-11-04 ENCOUNTER — Other Ambulatory Visit: Payer: Self-pay

## 2020-11-04 ENCOUNTER — Ambulatory Visit (INDEPENDENT_AMBULATORY_CARE_PROVIDER_SITE_OTHER): Payer: PPO | Admitting: Pharmacist

## 2020-11-04 ENCOUNTER — Encounter: Payer: Self-pay | Admitting: Pharmacist

## 2020-11-04 VITALS — BP 135/82

## 2020-11-04 DIAGNOSIS — E119 Type 2 diabetes mellitus without complications: Secondary | ICD-10-CM | POA: Diagnosis not present

## 2020-11-04 MED ORDER — FREESTYLE LIBRE 2 SENSOR MISC: 11 refills | Status: DC

## 2020-11-04 NOTE — Progress Notes (Signed)
    11/04/2020 Name: Billy Nolan MRN: [id number] DOB: Apr 19, 1946   S:  62 yoF presents for diabetes evaluation, education, and management Patient was referred and last seen by Primary Care Provider on 10/16/20.  Insurance coverage/medication affordability: [employer] medicare  Patient reports adherence with medications.  Current diabetes medications include: metformin, rybelsus  Current hypertension medications include: lisinopril Goal 130/80  Current hyperlipidemia medications include: fibrate, rosuvastatin, niacin   Patient denies hypoglycemic events.   Patient reported dietary habits: Eats 2-3 meals/day Discussed meal planning options and Plate method for healthy eating  Avoid sugary drinks and desserts  Incorporate balanced protein, non starchy veggies, 1 serving of carbohydrate with each meal  Increase water intake  Increase physical activity as able  Patient-reported exercise habits: n/a   O:  Lab Results  Component Value Date   HGBA1C 7.0 (H) 10/16/2020    Vitals:   11/04/20 1054  BP: 135/82       Lipid Panel     Component Value Date/Time   CHOL 138 10/16/2020 0804   TRIG 213 (H) 10/16/2020 0804   TRIG 125 10/24/2013 0930   HDL 32 (L) 10/16/2020 0804   HDL 35 (L) 10/24/2013 0930   CHOLHDL 4.3 10/16/2020 0804   LDLCALC 71 10/16/2020 0804   LDLCALC 84 10/24/2013 0930    Home fasting blood sugars: <130  2 hour post-meal/random blood sugars: <180.   A/P:  Diabetes T2DM currently controlled!  Patient is adherent with medication. Obtaining rybelsus via patient assistance program has assisted patient in glucose lowering endeavors.  -LIBRE 2 CGM CALLED IN TO [facility]--THIS SHOULD BE COVERED UNDER HEALTH TEAM ADVANTAGE AT LOCAL PHARMACY   -Continue rybelsus 7mg  daily--patient tolerating well now (GI symptoms in the past)  -reapplied for patient assistance program--medication to ship to PCP office  -Continue metformin (GFR  80)  -Extensively discussed pathophysiology of diabetes, recommended lifestyle interventions, dietary effects on blood sugar control  -Counseled on s/sx of and management of hypoglycemia  -Next A1C anticipated 6 months.  Written patient instructions provided.  Total time in face to face counseling 20 minutes.    [name], PharmD, BCPS Clinical Pharmacist, [place]  [facility] Phone [phone]

## 2020-11-05 ENCOUNTER — Ambulatory Visit: Payer: PPO | Admitting: Pharmacist

## 2020-11-05 DIAGNOSIS — M5459 Other low back pain: Secondary | ICD-10-CM | POA: Diagnosis not present

## 2020-11-05 DIAGNOSIS — M7062 Trochanteric bursitis, left hip: Secondary | ICD-10-CM | POA: Diagnosis not present

## 2020-11-05 DIAGNOSIS — M7061 Trochanteric bursitis, right hip: Secondary | ICD-10-CM | POA: Diagnosis not present

## 2020-12-02 ENCOUNTER — Encounter: Payer: Self-pay | Admitting: *Deleted

## 2021-01-05 ENCOUNTER — Other Ambulatory Visit: Payer: Self-pay | Admitting: Family Medicine

## 2021-01-05 ENCOUNTER — Telehealth: Payer: Self-pay

## 2021-01-05 MED ORDER — RYBELSUS 7 MG PO TABS: 7.0000 mg | ORAL_TABLET | Freq: Every day | ORAL | 0 refills | Status: DC

## 2021-01-05 NOTE — Telephone Encounter (Signed)
Please let the patient know that I sent their prescription to their pharmacy. Thanks, WS 

## 2021-01-05 NOTE — Telephone Encounter (Signed)
Please advise, not on current medication list. Pt was having side effects back in August

## 2021-01-05 NOTE — Telephone Encounter (Signed)
  Prescription Request  01/05/2021  What is the name of the medication or equipment? Rybelsus 7 mg not 3 mg   Have you contacted your pharmacy to request a refill? (if applicable) YES  Which pharmacy would you like this sent to? [facility] in [place]   Patient notified that their request is being sent to the clinical staff for review and that they should receive a response within 2 business days.

## 2021-01-06 NOTE — Telephone Encounter (Signed)
Patient notified

## 2021-01-09 DIAGNOSIS — M5459 Other low back pain: Secondary | ICD-10-CM | POA: Diagnosis not present

## 2021-01-20 ENCOUNTER — Ambulatory Visit (INDEPENDENT_AMBULATORY_CARE_PROVIDER_SITE_OTHER): Payer: PPO | Admitting: Family Medicine

## 2021-01-20 ENCOUNTER — Encounter: Payer: Self-pay | Admitting: Family Medicine

## 2021-01-20 ENCOUNTER — Other Ambulatory Visit: Payer: Self-pay

## 2021-01-20 VITALS — BP 132/83 | HR 98 | Temp 97.6°F | Resp 20 | Ht 68.0 in | Wt 158.2 lb

## 2021-01-20 DIAGNOSIS — R251 Tremor, unspecified: Secondary | ICD-10-CM | POA: Diagnosis not present

## 2021-01-20 DIAGNOSIS — Z1159 Encounter for screening for other viral diseases: Secondary | ICD-10-CM | POA: Diagnosis not present

## 2021-01-20 DIAGNOSIS — M48062 Spinal stenosis, lumbar region with neurogenic claudication: Secondary | ICD-10-CM | POA: Diagnosis not present

## 2021-01-20 DIAGNOSIS — E119 Type 2 diabetes mellitus without complications: Secondary | ICD-10-CM

## 2021-01-20 DIAGNOSIS — E785 Hyperlipidemia, unspecified: Secondary | ICD-10-CM | POA: Diagnosis not present

## 2021-01-20 LAB — BAYER DCA HB A1C WAIVED: HB A1C (BAYER DCA - WAIVED): 6.3 % (ref <7.0)

## 2021-01-20 NOTE — Progress Notes (Signed)
Subjective:  Patient ID: Billy Nolan, male    DOB: 1946-06-07  Age: 75 y.o. MRN: [id number]  CC: Medical Management of Chronic Issues   HPI Billy Nolan presents forFollow-up of diabetes. Patient checks blood sugar at home.  Glucose ranges 139  and 225 through the day when checking multiple times with his Billy Nolan device. Patient denies symptoms such as polyuria, polydipsia, excessive hunger, nausea No significant hypoglycemic spells noted. Medications reviewed.  Patient has discontinued all of his medications except for the sildenafil and the Rybelsus.  This is because of a great deal of vomiting that occurred right after he took his Covid booster.  This was early January.   In the meantime he has been seeing Dr. Gladstone Nolan for back and leg pain.  They noticed a tremor and want him to see a neurologist.  Tremor tremor History Billy Nolan has a past medical history of Allergy, Diabetes mellitus without complication ([place]illy [facility]), GERD (gastroesophageal reflux disease), Hyperlipidemia, and Hypertension.   He has a past surgical history that includes dental surgeries and Lumbar laminectomy/decompression microdiscectomy (Left, 01/30/2016).   His family history includes Cancer in his sister; Heart disease in his father and mother; Multiple sclerosis in his sister; Stroke in his father; Ulcerative colitis in his son.He reports that he has never smoked. He has never used smokeless tobacco. He reports that he does not drink alcohol and does not use drugs.  Current Outpatient Medications on File Prior to Visit  Medication Sig Dispense Refill  . Continuous Blood Gluc Sensor (FREESTYLE LIBRE 2 SENSOR) MISC USE TO CHECK BLOOD SUGAR 6 TIMES DAILY. Dx: E11.9 2 each 11  . famotidine (PEPCID) 20 MG tablet Take 1 tablet (20 mg total) by mouth daily. For reflux / heartburn 90 tablet 3  . Semaglutide (RYBELSUS) 7 MG TABS Take 7 mg by mouth daily. 90 tablet 0  . sildenafil (REVATIO) 20 MG tablet Take 2 to 5 daily as needed for  intimacy 60 tablet 5  . lisinopril (ZESTRIL) 10 MG tablet Take 1 tablet (10 mg total) by mouth daily. (Patient not taking: Reported on 01/20/2021) 90 tablet 1  . rosuvastatin (CRESTOR) 40 MG tablet TAKE 1/2 (ONE-HALF) TABLET BY MOUTH ONCE DAILY . (Patient not taking: Reported on 01/20/2021) 15 tablet 5   No current facility-administered medications on file prior to visit.    ROS Review of Systems  Constitutional: Negative for fever.  Respiratory: Negative for shortness of breath.   Cardiovascular: Negative for chest pain.  Musculoskeletal: Negative for arthralgias.  Skin: Negative for rash.    Objective:  BP 132/83   Pulse 98   Temp 97.6 F (36.4 C) (Temporal)   Resp 20   Ht 5' 8"  (1.727 m)   Wt 158 lb 4 oz (71.8 kg)   SpO2 97%   BMI 24.06 kg/m   BP Readings from Last 3 Encounters:  01/20/21 132/83  11/04/20 135/82  10/16/20 135/82    Wt Readings from Last 3 Encounters:  01/20/21 158 lb 4 oz (71.8 kg)  10/16/20 167 lb 4 oz (75.9 kg)  07/15/20 171 lb (77.6 kg)     Physical Exam Vitals reviewed.  Constitutional:      Appearance: He is well-developed and well-nourished.  HENT:     Head: Normocephalic and atraumatic.     Right Ear: Tympanic membrane and external ear normal. No decreased hearing noted.     Left Ear: Tympanic membrane and external ear normal. No decreased hearing noted.     Mouth/Throat:  Pharynx: No oropharyngeal exudate or posterior oropharyngeal erythema.  Eyes:     Pupils: Pupils are equal, round, and reactive to light.  Cardiovascular:     Rate and Rhythm: Normal rate and regular rhythm.     Heart sounds: No murmur heard.   Pulmonary:     Effort: No respiratory distress.     Breath sounds: Normal breath sounds.  Abdominal:     General: Bowel sounds are normal.     Palpations: Abdomen is soft. There is no mass.     Tenderness: There is no abdominal tenderness.  Musculoskeletal:     Cervical back: Normal range of motion and neck supple.     Mild rest tremor at the hands was noted.   Assessment & Plan:   Billy Nolan was seen today for medical management of chronic issues.  Diagnoses and all orders for this visit:  Type 2 diabetes mellitus without complication, without long-term current use of insulin (HCC) -     Bayer DCA Hb A1c Waived -     Microalbumin / creatinine urine ratio -     CBC with Differential/Platelet -     CMP14+EGFR -     Lipid panel  Hyperlipidemia, unspecified hyperlipidemia type -     Bayer DCA Hb A1c Waived -     Microalbumin / creatinine urine ratio -     CBC with Differential/Platelet -     CMP14+EGFR -     Lipid panel  Need for hepatitis C screening test -     Hepatitis C antibody  Spinal stenosis, lumbar region, with neurogenic claudication  Tremor -     Ambulatory referral to Neurology      I have discontinued Billy Nolan's Glucosamine-Chondroit-Vit C-Mn (GLUCOSAMINE 1500 COMPLEX PO), Magnesium, glucose blood, Cholecalciferol, niacin, metFORMIN, Niacin CR, fenofibrate, and ondansetron. I am also having him maintain his sildenafil, rosuvastatin, lisinopril, famotidine, FreeStyle Libre 2 Sensor, and Rybelsus.  No orders of the defined types were placed in this encounter.    Follow-up: Return in about 3 months (around 04/19/2021).  Billy Nolan, M.D.

## 2021-01-21 LAB — LIPID PANEL
Chol/HDL Ratio: 8.2 ratio — ABNORMAL HIGH (ref 0.0–5.0)
Cholesterol, Total: 261 mg/dL — ABNORMAL HIGH (ref 100–199)
HDL: 32 mg/dL — ABNORMAL LOW (ref >39)
LDL Chol Calc (NIH): 184 mg/dL — ABNORMAL HIGH (ref 0–99)
Triglycerides: 232 mg/dL — ABNORMAL HIGH (ref 0–149)
VLDL Cholesterol Cal: 45 mg/dL — ABNORMAL HIGH (ref 5–40)

## 2021-01-21 LAB — CBC WITH DIFFERENTIAL/PLATELET
Basophils Absolute: 0 10*3/uL (ref 0.0–0.2)
Basos: 1 %
EOS (ABSOLUTE): 0.1 10*3/uL (ref 0.0–0.4)
Eos: 1 %
Hematocrit: 50.1 % (ref 37.5–51.0)
Hemoglobin: 17 g/dL (ref 13.0–17.7)
Immature Grans (Abs): 0 10*3/uL (ref 0.0–0.1)
Immature Granulocytes: 0 %
Lymphocytes Absolute: 1.8 10*3/uL (ref 0.7–3.1)
Lymphs: 31 %
MCH: 32 pg (ref 26.6–33.0)
MCHC: 33.9 g/dL (ref 31.5–35.7)
MCV: 94 fL (ref 79–97)
Monocytes Absolute: 0.5 10*3/uL (ref 0.1–0.9)
Monocytes: 9 %
Neutrophils Absolute: 3.2 10*3/uL (ref 1.4–7.0)
Neutrophils: 58 %
Platelets: 321 10*3/uL (ref 150–450)
RBC: 5.31 x10E6/uL (ref 4.14–5.80)
RDW: 12.5 % (ref 11.6–15.4)
WBC: 5.6 10*3/uL (ref 3.4–10.8)

## 2021-01-21 LAB — CMP14+EGFR
ALT: 14 IU/L (ref 0–44)
AST: 7 IU/L (ref 0–40)
Albumin/Globulin Ratio: 1.9 (ref 1.2–2.2)
Albumin: 4.6 g/dL (ref 3.7–4.7)
Alkaline Phosphatase: 80 IU/L (ref 44–121)
BUN/Creatinine Ratio: 11 (ref 10–24)
BUN: 11 mg/dL (ref 8–27)
Bilirubin Total: 1.2 mg/dL (ref 0.0–1.2)
CO2: 21 mmol/L (ref 20–29)
Calcium: 10.1 mg/dL (ref 8.6–10.2)
Chloride: 99 mmol/L (ref 96–106)
Creatinine, Ser: 1.04 mg/dL (ref 0.76–1.27)
GFR calc Af Amer: 82 mL/min/{1.73_m2} (ref >59)
GFR calc non Af Amer: 71 mL/min/{1.73_m2} (ref >59)
Globulin, Total: 2.4 g/dL (ref 1.5–4.5)
Glucose: 142 mg/dL — ABNORMAL HIGH (ref 65–99)
Potassium: 4.8 mmol/L (ref 3.5–5.2)
Sodium: 138 mmol/L (ref 134–144)
Total Protein: 7 g/dL (ref 6.0–8.5)

## 2021-01-21 LAB — MICROALBUMIN / CREATININE URINE RATIO
Creatinine, Urine: 172 mg/dL
Microalb/Creat Ratio: 10 mg/g creat (ref 0–29)
Microalbumin, Urine: 17.5 ug/mL

## 2021-01-21 LAB — HEPATITIS C ANTIBODY: Hep C Virus Ab: <0.1 s/co ratio (ref 0.0–0.9)

## 2021-01-26 ENCOUNTER — Telehealth: Payer: Self-pay | Admitting: *Deleted

## 2021-01-26 NOTE — Telephone Encounter (Signed)
#  4 Rybelsus 7 mg boxes came in for pt  Pt assistance   Pt aware to pick up - up front

## 2021-03-26 ENCOUNTER — Ambulatory Visit: Payer: PPO | Admitting: Neurology

## 2021-03-30 ENCOUNTER — Other Ambulatory Visit: Payer: Self-pay

## 2021-03-30 ENCOUNTER — Encounter: Payer: Self-pay | Admitting: Nurse Practitioner

## 2021-03-30 ENCOUNTER — Ambulatory Visit: Payer: PPO | Admitting: Family Medicine

## 2021-03-30 ENCOUNTER — Ambulatory Visit (INDEPENDENT_AMBULATORY_CARE_PROVIDER_SITE_OTHER): Payer: PPO | Admitting: Nurse Practitioner

## 2021-03-30 VITALS — BP 146/93 | HR 84 | Temp 97.8°F | Ht 68.0 in | Wt 161.0 lb

## 2021-03-30 DIAGNOSIS — H6123 Impacted cerumen, bilateral: Secondary | ICD-10-CM

## 2021-03-30 NOTE — Assessment & Plan Note (Signed)
Earwax disimpacted.  Education provided to patient with printed handouts given.  Advised to use Debrox over-the-counter 2-3 times weekly.  Follow-up with worsening unresolved symptoms.

## 2021-03-30 NOTE — Patient Instructions (Addendum)
Ear Irrigation Ear irrigation is a procedure to wash dirt and wax out of your ear canal. This procedure is also called lavage. You may need ear irrigation if you are having trouble hearing because of a buildup of earwax. You may also have ear irrigation as part of the treatment for an ear infection. Getting wax and dirt out of your ear canal can help ear drops work better. Tell a health care provider about:  Any allergies you have.  All medicines you are taking, including vitamins, herbs, eye drops, creams, and over-the-counter medicines.  Any problems you or family members have had with anesthetic medicines.  Any blood disorders you have.  Any surgeries you have had. This includes any ear surgeries.  Any medical conditions you have.  Whether you are pregnant or may be pregnant. What are the risks? Generally, this is a safe procedure. However, problems may occur, including:  Infection.  Pain.  Hearing loss.  Fluid and debris being pushed through the eardrum and into the middle ear. This can occur if there are holes in the eardrum.  Ear irrigation failing to work. What happens before the procedure?  You will talk with your provider about the procedure and plan.  You may be given ear drops to put in your ear 15-20 minutes before irrigation. This helps loosen the wax. What happens during the procedure?  A syringe is filled with water or saline solution, which is made of salt and water.  The syringe is gently inserted into the ear canal.  The fluid is used to flush out wax and other debris. The procedure may vary among health care providers and hospitals.   What can I expect after the procedure? After an ear irrigation, follow instructions given to you by your health care provider. Follow these instructions at home: Using ear irrigation kits Ear irrigation kits are available for use at home. Ask your health care provider if this is an option for you. In general, you  should:  Use a home irrigation kit only as told by your health care provider.  Read the package instructions carefully.  Follow the directions for using the syringe.  Use water that is room temperature. Do not do ear irrigation at home if you:  Have diabetes. Diabetes increases the risk of infection.  Have a hole or tear in your eardrum.  Have tubes in your ears.  Have had any ear surgery in the past.  Have been told not to irrigate your ears. Cleaning your ears  Clean the outside of your ear with a soft washcloth daily.  If told by your health care provider, use a few drops of baby oil, mineral oil, glycerin, hydrogen peroxide, or over-the-counter earwax softening drops.  Do not use cotton swabs to clean your ears. These can push wax down into the ear canal.  Do not put anything into your ears to try to remove wax. This includes ear candles.   General instructions  Take over-the-counter and prescription medicines only as told by your health care provider.  If you were prescribed an antibiotic medicine, use it as told by your health care provider. Do not stop using the antibiotic even if your condition improves.  Keep the ear clean and dry by following the instructions from your health care provider.  Keep all follow-up visits. This is important.  Visit your health care provider at least once a year to have your ears and hearing checked. Contact a health care provider if:  Your   hearing is not improving or is getting worse.  You have pain or redness in your ear.  You are dizzy.  You have ringing in your ears.  You have nausea or vomiting.  You have fluid, blood, or pus coming out of your ear. Summary  Ear irrigation is a procedure to wash dirt and wax out of your ear canal. This procedure is also called lavage.  To perform ear irrigation, ear drops may be put in your ear 15-20 minutes before irrigation. Water or saline solution will be used to flush out earwax  and other debris.  You may be able to irrigate your ears at home. Ask your health care provider if this is an option for you. Follow your health care provider's instructions.  Clean your ears with a soft cloth after irrigation. Do not use cotton swabs to clean your ears. These can push wax down into the ear canal. This information is not intended to replace advice given to you by your health care provider. Make sure you discuss any questions you have with your health care provider. Document Revised: 03/11/2020 Document Reviewed: 03/11/2020 Elsevier Patient Education  2021 [employer]  

## 2021-03-30 NOTE — Progress Notes (Signed)
Acute Office Visit  Subjective:    Patient ID: Billy Nolan, male    DOB: 11/22/1946, 75 y.o.   MRN: [id number]  Chief Complaint  Patient presents with  . Ear Fullness    HPI Patient is in today for cerumen impaction.  This is not new for patient.  Patient tried to remove wax at home but unsuccessful.  Patient is not reporting any pain, ringing in the ear or vertigo.   Past Medical History:  Diagnosis Date  . Allergy   . Diabetes mellitus without complication ([place])   . GERD (gastroesophageal reflux disease)   . Hyperlipidemia   . Hypertension     Past Surgical History:  Procedure Laterality Date  . dental surgeries     teeth extractions, root cannal  . LUMBAR LAMINECTOMY/DECOMPRESSION MICRODISCECTOMY Left 01/30/2016   Procedure: LUMBAR LAMINECTOMY/CENTRAL DECOMPRESSION MICRODISCECTOMY L3 - L4 Nolan THE LEFT;  Surgeon: Latanya Maudlin, MD;  Location: [facility];  Service: Orthopedics;  Laterality: Left;    Family History  Problem Relation Age of Onset  . Heart disease Mother   . Stroke Father   . Heart disease Father   . Multiple sclerosis Sister   . Cancer Sister   . Ulcerative colitis Son   . Colon cancer Neg Hx     Social History   Socioeconomic History  . Marital status: Married    Spouse name: Not Nolan file  . Number of children: 3  . Years of education: 46  . Highest education level: Some college, no degree  Occupational History  . Occupation: Retired     Comment: Cytogeneticist  Tobacco Use  . Smoking status: Never Smoker  . Smokeless tobacco: Never Used  Vaping Use  . Vaping Use: Never used  Substance and Sexual Activity  . Alcohol use: No  . Drug use: No  . Sexual activity: Not Nolan file  Other Topics Concern  . Not Nolan file  Social History Narrative  . Not Nolan file   Social Determinants of Health   Financial Resource Strain: Not Nolan file  Food Insecurity: Not Nolan file  Transportation Needs: Not Nolan file  Physical Activity: Not Nolan file  Stress: Not  Nolan file  Social Connections: Not Nolan file  Intimate Partner Violence: Not Nolan file    Outpatient Medications Prior to Visit  Medication Sig Dispense Refill  . Continuous Blood Gluc Sensor (FREESTYLE LIBRE 2 SENSOR) MISC USE TO CHECK BLOOD SUGAR 6 TIMES DAILY. Dx: E11.9 2 each 11  . famotidine (PEPCID) 20 MG tablet Take 1 tablet (20 mg total) by mouth daily. For reflux / heartburn 90 tablet 3  . lisinopril (ZESTRIL) 10 MG tablet Take 1 tablet (10 mg total) by mouth daily. 90 tablet 1  . rosuvastatin (CRESTOR) 40 MG tablet TAKE 1/2 (ONE-HALF) TABLET BY MOUTH ONCE DAILY . 15 tablet 5  . Semaglutide (RYBELSUS) 7 MG TABS Take 7 mg by mouth daily. 90 tablet 0  . sildenafil (REVATIO) 20 MG tablet Take 2 to 5 daily as needed for intimacy 60 tablet 5   No facility-administered medications prior to visit.    Allergies  Allergen Reactions  . Sulfa Antibiotics Rash    Review of Systems  Constitutional: Negative.   HENT: Negative for ear discharge and ear pain.   Eyes: Negative.   Respiratory: Negative.   Cardiovascular: Negative.   Gastrointestinal: Negative.   Genitourinary: Negative.   Musculoskeletal: Negative.   Skin: Negative for rash.  All other systems  reviewed and are negative.      Objective:    Physical Exam Constitutional:      Appearance: Normal appearance.  HENT:     Head: Normocephalic.     Right Ear: External ear normal. Decreased hearing noted. No drainage, swelling or tenderness. There is impacted cerumen. No foreign body.     Left Ear: External ear normal. Decreased hearing noted. No drainage, swelling or tenderness. There is impacted cerumen. No foreign body.     Mouth/Throat:     Mouth: Mucous membranes are moist.     Pharynx: Oropharynx is clear.  Eyes:     Conjunctiva/sclera: Conjunctivae normal.  Cardiovascular:     Pulses: Normal pulses.     Heart sounds: Normal heart sounds.  Pulmonary:     Effort: Pulmonary effort is normal.     Breath sounds: Normal  breath sounds.  Abdominal:     General: Bowel sounds are normal.  Neurological:     Mental Status: He is alert.     There were no vitals taken for this visit. Wt Readings from Last 3 Encounters:  01/20/21 158 lb 4 oz (71.8 kg)  10/16/20 167 lb 4 oz (75.9 kg)  07/15/20 171 lb (77.6 kg)    Health Maintenance Due  Topic Date Due  . COVID-19 Vaccine (3 - Booster for Moderna series) 07/31/2020    There are no preventive care reminders to display for this patient.   Lab Results  Component Value Date   TSH 3.890 04/19/2016   Lab Results  Component Value Date   WBC 5.6 01/20/2021   HGB 17.0 01/20/2021   HCT 50.1 01/20/2021   MCV 94 01/20/2021   PLT 321 01/20/2021   Lab Results  Component Value Date   NA 138 01/20/2021   K 4.8 01/20/2021   CO2 21 01/20/2021   GLUCOSE 142 (H) 01/20/2021   BUN 11 01/20/2021   CREATININE 1.04 01/20/2021   BILITOT 1.2 01/20/2021   ALKPHOS 80 01/20/2021   AST 7 01/20/2021   ALT 14 01/20/2021   PROT 7.0 01/20/2021   ALBUMIN 4.6 01/20/2021   CALCIUM 10.1 01/20/2021   ANIONGAP 9 01/27/2016   Lab Results  Component Value Date   CHOL 261 (H) 01/20/2021   Lab Results  Component Value Date   HDL 32 (L) 01/20/2021   Lab Results  Component Value Date   LDLCALC 184 (H) 01/20/2021   Lab Results  Component Value Date   TRIG 232 (H) 01/20/2021   Lab Results  Component Value Date   CHOLHDL 8.2 (H) 01/20/2021   Lab Results  Component Value Date   HGBA1C 6.3 01/20/2021       Assessment & Plan:   Problem List Items Addressed This Visit      Nervous and Auditory   Bilateral impacted cerumen - Primary    Earwax disimpacted.  Education provided to patient with printed handouts given.  Advised to use Debrox over-the-counter 2-3 times weekly.  Follow-up with worsening unresolved symptoms.          No orders of the defined types were placed in this encounter.    Ivy Lynn, NP

## 2021-04-06 DIAGNOSIS — M5459 Other low back pain: Secondary | ICD-10-CM | POA: Diagnosis not present

## 2021-04-08 ENCOUNTER — Telehealth: Payer: Self-pay | Admitting: Pharmacist

## 2021-04-08 NOTE — Telephone Encounter (Signed)
Refills requested for NovoNordisk patient assistance (Rybelsus) On 04/01/21 Can take up to 1 month to ship to PCP office  

## 2021-04-13 DIAGNOSIS — M5459 Other low back pain: Secondary | ICD-10-CM | POA: Diagnosis not present

## 2021-04-16 ENCOUNTER — Encounter: Payer: Self-pay | Admitting: Family Medicine

## 2021-04-16 ENCOUNTER — Telehealth: Payer: Self-pay | Admitting: *Deleted

## 2021-04-16 ENCOUNTER — Ambulatory Visit (INDEPENDENT_AMBULATORY_CARE_PROVIDER_SITE_OTHER): Payer: PPO | Admitting: Family Medicine

## 2021-04-16 ENCOUNTER — Other Ambulatory Visit: Payer: Self-pay

## 2021-04-16 VITALS — BP 147/92 | HR 83 | Temp 97.4°F | Ht 68.0 in | Wt 160.4 lb

## 2021-04-16 DIAGNOSIS — E785 Hyperlipidemia, unspecified: Secondary | ICD-10-CM | POA: Diagnosis not present

## 2021-04-16 DIAGNOSIS — N521 Erectile dysfunction due to diseases classified elsewhere: Secondary | ICD-10-CM

## 2021-04-16 DIAGNOSIS — E119 Type 2 diabetes mellitus without complications: Secondary | ICD-10-CM | POA: Diagnosis not present

## 2021-04-16 DIAGNOSIS — E1141 Type 2 diabetes mellitus with diabetic mononeuropathy: Secondary | ICD-10-CM | POA: Diagnosis not present

## 2021-04-16 LAB — BAYER DCA HB A1C WAIVED: HB A1C (BAYER DCA - WAIVED): 6.9 % (ref <7.0)

## 2021-04-16 MED ORDER — FAMOTIDINE 20 MG PO TABS: 20.0000 mg | ORAL_TABLET | Freq: Every day | ORAL | 3 refills | Status: DC

## 2021-04-16 MED ORDER — SILDENAFIL CITRATE 20 MG PO TABS
ORAL_TABLET | ORAL | 5 refills | Status: DC
Start: 2021-04-16 — End: 2021-06-19

## 2021-04-16 MED ORDER — LISINOPRIL 10 MG PO TABS: 10.0000 mg | ORAL_TABLET | Freq: Every day | ORAL | 1 refills | Status: DC

## 2021-04-16 MED ORDER — METFORMIN HCL ER 500 MG PO TB24: 500.0000 mg | ORAL_TABLET | Freq: Every day | ORAL | 1 refills | Status: DC

## 2021-04-16 MED ORDER — ROSUVASTATIN CALCIUM 20 MG PO TABS: 20.0000 mg | ORAL_TABLET | Freq: Every day | ORAL | 3 refills | Status: DC

## 2021-04-16 NOTE — Telephone Encounter (Signed)
Key: [id number] - PA Case ID: [id number] - Rx #: [id number]  Drug Sildenafil Citrate 20MG  tablets Form [employer] 4-Part Electronic [place] Form [phone] NCPDP) Original Claim Info [id number]  Sent to plan

## 2021-04-16 NOTE — Progress Notes (Signed)
Subjective:  Patient ID: Billy Nolan,  male    DOB: 1946/03/17  Age: 75 y.o.    CC: Medical Management of Chronic Issues   HPI Billy Nolan presents for  follow-up of hypertension. Patient has no history of headache chest pain or shortness of breath or recent cough. Patient also denies symptoms of TIA such as numbness weakness lateralizing. Patient denies side effects from medication. States taking it regularly.  Patient also  in for follow-up of elevated cholesterol. Doing well without complaints on current medication. Denies side effects  including myalgia and arthralgia and nausea. Also in today for liver function testing. Currently no chest pain, shortness of breath or other cardiovascular related symptoms noted.  Follow-up of diabetes. Patient does check blood sugar at home. Readings run between 140 and 180 Patient denies symptoms such as excessive hunger or urinary frequency, excessive hunger, nausea No significant hypoglycemic spells noted. Medications reviewed. Pt reports taking them regularly. Pt. denies complication/adverse reaction today.    History Billy Nolan has a past medical history of Allergy, Diabetes mellitus without complication ([place]), GERD (gastroesophageal reflux disease), Hyperlipidemia, and Hypertension.   He has a past surgical history that includes dental surgeries and Lumbar laminectomy/decompression microdiscectomy (Left, 01/30/2016).   His family history includes Cancer in his sister; Heart disease in his father and mother; Multiple sclerosis in his sister; Stroke in his father; Ulcerative colitis in his son.He reports that he has never smoked. He has never used smokeless tobacco. He reports that he does not drink alcohol and does not use drugs.  Current Outpatient Medications on File Prior to Visit  Medication Sig Dispense Refill  . Continuous Blood Gluc Sensor (FREESTYLE LIBRE 2 SENSOR) MISC USE TO CHECK BLOOD SUGAR 6 TIMES DAILY. Dx: E11.9 2 each 11  .  famotidine (PEPCID) 20 MG tablet Take 1 tablet (20 mg total) by mouth daily. For reflux / heartburn 90 tablet 3  . lisinopril (ZESTRIL) 10 MG tablet Take 1 tablet (10 mg total) by mouth daily. 90 tablet 1  . rosuvastatin (CRESTOR) 40 MG tablet TAKE 1/2 (ONE-HALF) TABLET BY MOUTH ONCE DAILY . 15 tablet 5  . Semaglutide (RYBELSUS) 7 MG TABS Take 7 mg by mouth daily. 90 tablet 0  . sildenafil (REVATIO) 20 MG tablet Take 2 to 5 daily as needed for intimacy 60 tablet 5   No current facility-administered medications on file prior to visit.    ROS Review of Systems  Constitutional: Negative for fever.  Respiratory: Negative for shortness of breath.   Cardiovascular: Negative for chest pain.  Musculoskeletal: Negative for arthralgias.  Skin: Negative for rash.    Objective:  BP (!) 147/92   Pulse 83   Temp (!) 97.4 F (36.3 C)   Ht 5' 8"  (1.727 m)   Wt 160 lb 6.4 oz (72.8 kg)   SpO2 96%   BMI 24.39 kg/m   BP Readings from Last 3 Encounters:  04/16/21 (!) 147/92  03/30/21 (!) 146/93  01/20/21 132/83    Wt Readings from Last 3 Encounters:  04/16/21 160 lb 6.4 oz (72.8 kg)  03/30/21 161 lb (73 kg)  01/20/21 158 lb 4 oz (71.8 kg)     Physical Exam Vitals reviewed.  Constitutional:      Appearance: He is well-developed.  HENT:     Head: Normocephalic and atraumatic.     Right Ear: Tympanic membrane and external ear normal. No decreased hearing noted.     Left Ear: Tympanic membrane and external ear normal.  No decreased hearing noted.     Mouth/Throat:     Pharynx: No oropharyngeal exudate or posterior oropharyngeal erythema.  Eyes:     Pupils: Pupils are equal, round, and reactive to light.  Cardiovascular:     Rate and Rhythm: Normal rate and regular rhythm.     Heart sounds: No murmur heard.   Pulmonary:     Effort: No respiratory distress.     Breath sounds: Normal breath sounds.  Abdominal:     General: Bowel sounds are normal.     Palpations: Abdomen is soft.  There is no mass.     Tenderness: There is no abdominal tenderness.  Musculoskeletal:     Cervical back: Normal range of motion and neck supple.     Diabetic Foot Exam - Simple   Simple Foot Form Diabetic Foot exam was performed with the following findings: Yes 04/16/2021  8:46 AM  Visual Inspection No deformities, no ulcerations, no other skin breakdown bilaterally: Yes Sensation Testing Intact to touch and monofilament testing bilaterally: Yes Pulse Check Posterior Tibialis and Dorsalis pulse intact bilaterally: Yes Comments       Assessment & Plan:   Billy Nolan was seen today for medical management of chronic issues.  Diagnoses and all orders for this visit:  Type 2 diabetes mellitus without complication, without long-term current use of insulin ([place]) -     Bayer DCA Hb A1c Waived -     CBC with Differential/Platelet -     CMP14+EGFR  Hyperlipidemia, unspecified hyperlipidemia type -     Lipid panel   I am having Billy Nolan maintain his sildenafil, rosuvastatin, lisinopril, famotidine, FreeStyle Libre 2 Sensor, and Rybelsus.  No orders of the defined types were placed in this encounter.    Follow-up: No follow-ups on file.  [name], M.D.

## 2021-04-17 LAB — CMP14+EGFR
ALT: 11 IU/L (ref 0–44)
AST: 14 IU/L (ref 0–40)
Albumin/Globulin Ratio: 2 (ref 1.2–2.2)
Albumin: 4.6 g/dL (ref 3.7–4.7)
Alkaline Phosphatase: 71 IU/L (ref 44–121)
BUN/Creatinine Ratio: 12 (ref 10–24)
BUN: 12 mg/dL (ref 8–27)
Bilirubin Total: 1 mg/dL (ref 0.0–1.2)
CO2: 23 mmol/L (ref 20–29)
Calcium: 10.1 mg/dL (ref 8.6–10.2)
Chloride: 102 mmol/L (ref 96–106)
Creatinine, Ser: 0.98 mg/dL (ref 0.76–1.27)
Globulin, Total: 2.3 g/dL (ref 1.5–4.5)
Glucose: 180 mg/dL — ABNORMAL HIGH (ref 65–99)
Potassium: 5.3 mmol/L — ABNORMAL HIGH (ref 3.5–5.2)
Sodium: 139 mmol/L (ref 134–144)
Total Protein: 6.9 g/dL (ref 6.0–8.5)
eGFR: 81 mL/min/{1.73_m2} (ref >59)

## 2021-04-17 LAB — CBC WITH DIFFERENTIAL/PLATELET
Basophils Absolute: 0.1 10*3/uL (ref 0.0–0.2)
Basos: 1 %
EOS (ABSOLUTE): 0.2 10*3/uL (ref 0.0–0.4)
Eos: 4 %
Hematocrit: 49.9 % (ref 37.5–51.0)
Hemoglobin: 17 g/dL (ref 13.0–17.7)
Immature Grans (Abs): 0 10*3/uL (ref 0.0–0.1)
Immature Granulocytes: 0 %
Lymphocytes Absolute: 1.4 10*3/uL (ref 0.7–3.1)
Lymphs: 27 %
MCH: 31.6 pg (ref 26.6–33.0)
MCHC: 34.1 g/dL (ref 31.5–35.7)
MCV: 93 fL (ref 79–97)
Monocytes Absolute: 0.4 10*3/uL (ref 0.1–0.9)
Monocytes: 8 %
Neutrophils Absolute: 3 10*3/uL (ref 1.4–7.0)
Neutrophils: 60 %
Platelets: 241 10*3/uL (ref 150–450)
RBC: 5.38 x10E6/uL (ref 4.14–5.80)
RDW: 13.1 % (ref 11.6–15.4)
WBC: 5.1 10*3/uL (ref 3.4–10.8)

## 2021-04-17 LAB — LIPID PANEL
Chol/HDL Ratio: 7.9 ratio — ABNORMAL HIGH (ref 0.0–5.0)
Cholesterol, Total: 220 mg/dL — ABNORMAL HIGH (ref 100–199)
HDL: 28 mg/dL — ABNORMAL LOW (ref >39)
LDL Chol Calc (NIH): 132 mg/dL — ABNORMAL HIGH (ref 0–99)
Triglycerides: 333 mg/dL — ABNORMAL HIGH (ref 0–149)
VLDL Cholesterol Cal: 60 mg/dL — ABNORMAL HIGH (ref 5–40)

## 2021-04-22 NOTE — Telephone Encounter (Signed)
Patient aware.

## 2021-04-22 NOTE — Telephone Encounter (Signed)
Denied on May 15 PA Case: [id number], Status: Denied. Notification: Completed.

## 2021-04-22 NOTE — Telephone Encounter (Signed)
Let him know. Most men have to pay out of pocket for this medication.HE may want to check [facility] store or [facility] in [place] (they ship) for the best price

## 2021-05-01 ENCOUNTER — Telehealth: Payer: Self-pay | Admitting: *Deleted

## 2021-05-01 NOTE — Telephone Encounter (Signed)
Pt wife called and aware [facility] Rybelsus 7 mg - #4 boxes came in today and they are up front for pick up.

## 2021-05-11 DIAGNOSIS — M5459 Other low back pain: Secondary | ICD-10-CM | POA: Diagnosis not present

## 2021-05-11 DIAGNOSIS — M5136 Other intervertebral disc degeneration, lumbar region: Secondary | ICD-10-CM | POA: Diagnosis not present

## 2021-05-11 DIAGNOSIS — M961 Postlaminectomy syndrome, not elsewhere classified: Secondary | ICD-10-CM | POA: Diagnosis not present

## 2021-05-12 ENCOUNTER — Ambulatory Visit: Payer: PPO | Admitting: Neurology

## 2021-05-12 ENCOUNTER — Encounter: Payer: Self-pay | Admitting: Neurology

## 2021-05-12 VITALS — BP 121/82 | HR 97 | Ht 67.0 in | Wt 163.0 lb

## 2021-05-12 DIAGNOSIS — G250 Essential tremor: Secondary | ICD-10-CM

## 2021-05-12 NOTE — Progress Notes (Signed)
Subjective:    Patient ID: Billy Nolan is a 75 y.o. male.  HPI     [name], MD, PhD [facility] [address], [address] [place], [place] [address]  Dear Dr. Livia Snellen,   I saw your patient, Billy Nolan, upon your kind request in my neurologic clinic today for initial consultation of his hand tremors.  The patient is accompanied by his wife today.  As you know, Billy Nolan is a 75 year old right-handed gentleman with an underlying medical history of allergies, diabetes, reflux disease, chronic low back pain, hypertension, and hyperlipidemia, who reports an approximately 1 year history of hand tremors affecting primarily his right upper extremity.  His tremor is noticeable when he holds something or writes or holds onto the steering wheel.  He has not had any abnormal jerking-like movements.  He has not had any lower extremity tremor, sometimes the tremor is noticeable when he is sitting and resting per wife.  He has a family history of hand tremor affecting his brother and his maternal grandmother also had a tremor affecting primarily her head.  His mom did not have a tremor and no other family history is reported.  He does not have a family history of Parkinson's disease.  He has not fallen recently and while his balance is not as good as it used to be, he does not have any specific balance issues.  He has chronic low back pain and may need lumbar spine surgery under Dr. Rolena Infante.  He has also seen Dr. Gladstone Lighter.  He had surgery to the lower spine in 2017.  He has not been particularly bothered by the tremor.  I reviewed your office note from 01/20/2021.  He was noted to have a mild bilateral hand resting tremor at the time.   He is semiretired.  He works in an International aid/development worker.  He is a non-smoker and does not utilize alcohol, does not take any illicit drugs, limits his caffeine to 1 soda per day, usually diet Dr. Malachi Nolan.  His Past Medical History Is Significant For: Past  Medical History:  Diagnosis Date  . Allergy   . Diabetes mellitus without complication ([place])   . GERD (gastroesophageal reflux disease)   . Hyperlipidemia   . Hypertension     His Past Surgical History Is Significant For: Past Surgical History:  Procedure Laterality Date  . dental surgeries     teeth extractions, root cannal  . LUMBAR LAMINECTOMY/DECOMPRESSION MICRODISCECTOMY Left 01/30/2016   Procedure: LUMBAR LAMINECTOMY/CENTRAL DECOMPRESSION MICRODISCECTOMY L3 - L4 ON THE LEFT;  Surgeon: Latanya Maudlin, MD;  Location: [facility];  Service: Orthopedics;  Laterality: Left;    His Family History Is Significant For: Family History  Problem Relation Age of Onset  . Heart disease Mother   . Stroke Father   . Heart disease Father   . Multiple sclerosis Sister   . Cancer Sister   . Ulcerative colitis Son   . Colon cancer Neg Hx     His Social History Is Significant For: Social History   Socioeconomic History  . Marital status: Married    Spouse name: Not on file  . Number of children: 3  . Years of education: 75  . Highest education level: Some college, no degree  Occupational History  . Occupation: Retired     Comment: Cytogeneticist  Tobacco Use  . Smoking status: Never Smoker  . Smokeless tobacco: Never Used  Vaping Use  . Vaping Use: Never  used  Substance and Sexual Activity  . Alcohol use: No  . Drug use: No  . Sexual activity: Not on file  Other Topics Concern  . Not on file  Social History Narrative  . Not on file   Social Determinants of Health   Financial Resource Strain: Not on file  Food Insecurity: Not on file  Transportation Needs: Not on file  Physical Activity: Not on file  Stress: Not on file  Social Connections: Not on file    His Allergies Are:  Allergies  Allergen Reactions  . Sulfa Antibiotics Rash  :   His Current Medications Are:  Outpatient Encounter Medications as of 05/12/2021  Medication Sig  . Continuous Blood Gluc Sensor  (FREESTYLE LIBRE 2 SENSOR) MISC USE TO CHECK BLOOD SUGAR 6 TIMES DAILY. Dx: E11.9  . famotidine (PEPCID) 20 MG tablet Take 1 tablet (20 mg total) by mouth daily. For reflux / heartburn  . lisinopril (ZESTRIL) 10 MG tablet Take 1 tablet (10 mg total) by mouth daily.  . metFORMIN (GLUCOPHAGE-XR) 500 MG 24 hr tablet Take 1 tablet (500 mg total) by mouth daily with breakfast.  . rosuvastatin (CRESTOR) 20 MG tablet Take 1 tablet (20 mg total) by mouth daily.  . Semaglutide (RYBELSUS) 7 MG TABS Take 7 mg by mouth daily.  . sildenafil (REVATIO) 20 MG tablet Take 2 to 5 daily as needed for intimacy   No facility-administered encounter medications on file as of 05/12/2021.  :   Review of Systems:  Out of a complete 14 point review of systems, all are reviewed and negative with the exception of these symptoms as listed below:  Review of Systems  Neurological:       Here for worsening hand tremors. Reports right hand is worse, he is dominate on the right side.  Sx have been present for a while. Denies any trying any medications/treatment for these sx. Orthopedic recommended a MRI to r/u any neurological reasons for the tremors. Reports sx are worse when he is anxious/excited/tired.    Objective:  Neurological Exam  Physical Exam Physical Examination:   Vitals:   05/12/21 0741  BP: 121/82  Pulse: 97    General Examination: The patient is a very pleasant 75 y.o. male in no acute distress. He appears well-developed and well-nourished and well groomed.   HEENT: Normocephalic, atraumatic, pupils are equal, round and reactive to light. Extraocular tracking is good without limitation to gaze excursion or nystagmus noted. Normal smooth pursuit is noted. Hearing is grossly intact. Face is symmetric with normal facial animation. Speech is clear with no dysarthria noted. There is no hypophonia. There is no lip, neck/head, jaw or voice tremor. Neck is supple with full range of passive and active motion.  There are no carotid bruits on auscultation. Oropharynx exam reveals: mild mouth dryness, adequate dental hygiene. Tongue protrudes centrally and palate elevates symmetrically. Tongue movements unremarkable.   Chest: Clear to auscultation without wheezing, rhonchi or crackles noted.  Heart: S1+S2+0, regular and normal without murmurs, rubs or gallops noted.   Abdomen: Soft, non-tender and non-distended with normal bowel sounds appreciated on auscultation.  Extremities: There is no pitting edema in the distal lower extremities bilaterally. Pedal pulses are intact.  Skin: Warm and dry without trophic changes noted.  Musculoskeletal: exam reveals no obvious joint deformities, tenderness or joint swelling or erythema.   Neurologically:  Mental status: The patient is awake, alert and oriented in all 4 spheres. His immediate and remote memory, attention, language  skills and fund of knowledge are appropriate. There is no evidence of aphasia, agnosia, apraxia or anomia. Speech is clear with normal prosody and enunciation. Thought process is linear. Mood is normal and affect is normal.  Cranial nerves II - XII are as described above under HEENT exam. In addition: shoulder shrug is normal with equal shoulder height noted.  On 05/12/2021: On Archimedes spiral drawing he has minimal trembling with the right hand which is his dominant hand, minimal to mild trembling with the left hand, handwriting is legible, not particularly tremulous, not micrographic.  Motor exam: Normal bulk, strength and tone is noted. There is no drift, or rebound.  He has a minimal postural tremor mostly noted in the right hand, intermittent and minimal in the left hand, slight action tremor in both upper extremities, slightly more noticeable on the right than left, intermittent and rather mild resting tremor in the right hand.  Tremor is fairly fast in frequency and small in amplitude.  Romberg is negative. Reflexes are 1+ in the upper  extremities, trace in both knees and absent in the ankles.  Babinski: Toes are flexor bilaterally. Fine motor skills and coordination: intact with normal finger taps, normal hand movements, normal rapid alternating patting, normal foot taps and normal foot agility.  Cerebellar testing: No dysmetria or intention tremor on finger to nose testing. Heel to shin is unremarkable bilaterally. There is no truncal or gait ataxia.  Sensory exam: intact to light touch in the upper and lower extremities.  Gait, station and balance: He stands easily. No veering to one side is noted. No leaning to one side is noted. Posture is age-appropriate and stance is narrow based. Gait shows normal stride length and normal pace. No problems turning are noted. Tandem walk is unremarkable.               Assessment and Plan:   In summary, Billy Nolan is a very pleasant 75 y.o.-year old male with an underlying medical history of allergies, diabetes, reflux disease, chronic low back pain, hypertension, and hyperlipidemia, who presents for evaluation of his hand tremors of approximately 1 years duration.  Examination shows a mild right more than left postural and action tremor, slight degree of resting component in the right upper extremity intermittently.  Examination and history are supportive of a mild form of essential tremor, no obvious or telltale signs of parkinsonism were seen and he is largely reassured today.  He is not particularly bothered by the tremor.  He is advised that we could consider symptomatic treatments for his tremor but we mutually agreed to continue to monitor his symptoms and exam at this time.  He has not had a recent thyroid function checked, we will do a TSH today.  He has not had a brain scan and his orthopedic surgeon recommended that he pursue a brain MRI.  I would be happy to order a brain MRI with and without contrast for reassurance, to rule out a structural cause of his tremor.  He has overall mild  findings and we can continue to monitor.  He is agreeable to this approach.  To that end, I recommended a follow-up routinely in 1 year.  We will call him in the interim once we have his brain MRI results available.  He is advised to stay well rested and well-hydrated and limit his caffeine.  He currently does pursue all of these things.  He is advised to call us with any interim questions or concerns he  may have and we will plan to follow-up for routine checkup in 1 year.  We will keep him posted as to his brain MRI results and TSH results by phone call in the interim.  I answered all their questions today and the patient and his wife were in agreement. Thank you very much for allowing me to participate in the care of this nice patient. If I can be of any further assistance to you please do not hesitate to call me at [phone].  Sincerely,   [name], MD, PhD

## 2021-05-12 NOTE — Patient Instructions (Addendum)
You have a mild and intermittent tremor of both hands, more so on the right.  I do not see any signs or symptoms of parkinson's like disease or what we call parkinsonism.   For your tremor, I would not recommend any new medication for fear of side effects and because of milder findings at this time.  I would like to recheck your tremor in about a years' time.  You can always call and make a sooner appointment if you need to.   Please remember, that any kind of tremor may be exacerbated by anxiety, anger, nervousness, excitement, thyroid disease, dehydration, sleep deprivation, by caffeine, and low blood sugar values or blood sugar fluctuations. Some medications can exacerbate tremors, this includes in particular antidepressant medications.  You are currently not taking any such medication.  We will check your thyroid screening test called TSH today.  Your kidney function was okay when Dr. Livia Snellen checked it last month.  We will do a brain scan, called MRI and call you with the test results. We will have to schedule you for this on a separate date. This test requires authorization from your insurance, and we will take care of the insurance process.

## 2021-05-13 ENCOUNTER — Telehealth: Payer: Self-pay | Admitting: *Deleted

## 2021-05-13 LAB — TSH: TSH: 4.76 u[IU]/mL — ABNORMAL HIGH (ref 0.450–4.500)

## 2021-05-13 NOTE — Progress Notes (Signed)
Please call patient and advise him that his thyroid screening test called TSH was mildly abnormal, this can indicate underfunctioning of his thyroid gland.  I would like for him to discuss further work-up and evaluation as well as recheck with his primary care physician.

## 2021-05-13 NOTE — Telephone Encounter (Signed)
Called patient, LVM advising patient that his thyroid screening test called TSH was mildly abnormal, this can indicate underfunctioning of his thyroid gland. Dr Rexene Alberts would like for him to discuss further work-up and evaluation as well as recheck with his primary care physician. Left # for questions.

## 2021-05-14 ENCOUNTER — Ambulatory Visit: Payer: Self-pay | Admitting: Orthopedic Surgery

## 2021-05-22 ENCOUNTER — Other Ambulatory Visit: Payer: Self-pay

## 2021-05-22 ENCOUNTER — Ambulatory Visit
Admission: RE | Admit: 2021-05-22 | Discharge: 2021-05-22 | Disposition: A | Discharge status: Home / Self Care | Payer: PPO | Source: Ambulatory Visit | Attending: Neurology | Admitting: Neurology

## 2021-05-22 DIAGNOSIS — G250 Essential tremor: Secondary | ICD-10-CM

## 2021-05-22 MED ORDER — GADOBENATE DIMEGLUMINE 529 MG/ML IV SOLN
15.0000 mL | Freq: Once | INTRAVENOUS | Status: AC | PRN
  Administered 2021-05-22: 15 mL via INTRAVENOUS

## 2021-05-25 ENCOUNTER — Ambulatory Visit (INDEPENDENT_AMBULATORY_CARE_PROVIDER_SITE_OTHER): Payer: PPO

## 2021-05-25 ENCOUNTER — Telehealth: Payer: Self-pay

## 2021-05-25 DIAGNOSIS — Z0000 Encounter for general adult medical examination without abnormal findings: Secondary | ICD-10-CM | POA: Diagnosis not present

## 2021-05-25 NOTE — Progress Notes (Signed)
MEDICARE ANNUAL WELLNESS VISIT  05/25/2021  Telephone Visit Disclaimer This Medicare AWV was conducted by telephone due to national recommendations for restrictions regarding the COVID-19 Pandemic (e.g. social distancing).  I verified, using two identifiers, that I am speaking with Billy Nolan or their authorized healthcare agent. I discussed the limitations, risks, security, and privacy concerns of performing an evaluation and management service by telephone and the potential availability of an in-person appointment in the future. The patient expressed understanding and agreed to proceed.  Location of Patient: Home Location of Provider (nurse):  [facility]  Subjective:    Billy Nolan is a 75 y.o. male patient of Stacks, Cletus Gash, MD who had a Medicare Annual Wellness Visit today via telephone. Billy Nolan is Retired and lives with their spouse. Hehas three children. He reports that he is socially active and does interact with friends/family regularly. He is minimally physically active and enjoys fishing and hiking.  Patient Care Team: [name], MD as PCP - General (Family Medicine) Lavera Guise, [facility] (Pharmacist)  Advanced Directives 05/25/2021 05/22/2020 04/26/2019 01/30/2016 01/27/2016 09/12/2014 08/28/2014  Does Patient Have a Medical Advance Directive? Yes Yes Yes Yes Yes Yes Yes  Type of Paramedic of [place];Living will Living will [place];Living will [place];Living will [place];Living will [place];Living will [place];Living will  Does patient want to make changes to medical advance directive? No - Patient declined No - Patient declined No - Patient declined No - Patient declined No - Patient declined - -  Copy of [place] in Chart? No - copy requested - No - copy requested No - copy requested No - copy requested - No - copy requested  Would  patient like information on creating a medical advance directive? - - - - - - No - patient declined information    Hospital Utilization Over the Past 12 Months: # of hospitalizations or ER visits: 0 # of surgeries: 0  Review of Systems    Patient reports that his overall health is unchanged compared to last year.  History obtained from chart review and the patient  Patient Reported Readings (BP, Pulse, CBG, Weight, etc) none  Pain Assessment Pain : No/denies pain     Current Medications & Allergies (verified) Allergies as of 05/25/2021       Reactions   Sulfa Antibiotics Rash        Medication List        Accurate as of May 25, 2021  9:09 AM. If you have any questions, ask your nurse or doctor.          famotidine 20 MG tablet Commonly known as: PEPCID Take 1 tablet (20 mg total) by mouth daily. For reflux / heartburn   FreeStyle Libre 2 Sensor Misc USE TO CHECK BLOOD SUGAR 6 TIMES DAILY. Dx: E11.9   lisinopril 10 MG tablet Commonly known as: ZESTRIL Take 1 tablet (10 mg total) by mouth daily.   metFORMIN 500 MG 24 hr tablet Commonly known as: GLUCOPHAGE-XR Take 1 tablet (500 mg total) by mouth daily with breakfast.   rosuvastatin 20 MG tablet Commonly known as: CRESTOR Take 1 tablet (20 mg total) by mouth daily.   Rybelsus 7 MG Tabs Generic drug: Semaglutide Take 7 mg by mouth daily.   sildenafil 20 MG tablet Commonly known as: REVATIO Take 2 to 5 daily as needed for intimacy  History (reviewed): Past Medical History:  Diagnosis Date   Allergy    Diabetes mellitus without complication (HCC)    GERD (gastroesophageal reflux disease)    Hyperlipidemia    Hypertension    Past Surgical History:  Procedure Laterality Date   dental surgeries     teeth extractions, root cannal   LUMBAR LAMINECTOMY/DECOMPRESSION MICRODISCECTOMY Left 01/30/2016   Procedure: LUMBAR LAMINECTOMY/CENTRAL DECOMPRESSION MICRODISCECTOMY L3 - L4 ON THE LEFT;   Surgeon: Latanya Maudlin, MD;  Location: [facility];  Service: Orthopedics;  Laterality: Left;   Family History  Problem Relation Age of Onset   Heart disease Mother    Stroke Father    Heart disease Father    Multiple sclerosis Sister    Cancer Sister    Ulcerative colitis Son    Colon cancer Neg Hx    Social History   Socioeconomic History   Marital status: Married    Spouse name: Not on file   Number of children: 3   Years of education: 14   Highest education level: Some college, no degree  Occupational History   Occupation: Retired     Comment: Cytogeneticist  Tobacco Use   Smoking status: Never   Smokeless tobacco: Never  Vaping Use   Vaping Use: Never used  Substance and Sexual Activity   Alcohol use: No   Drug use: No   Sexual activity: Not on file  Other Topics Concern   Not on file  Social History Narrative   Not on file   Social Determinants of Health   Financial Resource Strain: Not on file  Food Insecurity: Not on file  Transportation Needs: Not on file  Physical Activity: Not on file  Stress: Not on file  Social Connections: Not on file    Activities of Daily Living In your present state of health, do you have any difficulty performing the following activities: 05/25/2021  Hearing? N  Vision? N  Difficulty concentrating or making decisions? N  Walking or climbing stairs? N  Dressing or bathing? N  Doing errands, shopping? N  Preparing Food and eating ? N  Using the Toilet? N  In the past six months, have you accidently leaked urine? N  Do you have problems with loss of bowel control? N  Managing your Medications? N  Managing your Finances? N  Housekeeping or managing your Housekeeping? N  Some recent data might be hidden    Patient Education/ Literacy How often do you need to have someone help you when you read instructions, pamphlets, or other written materials from your doctor or pharmacy?: 1 - Never What is the last grade level you  completed in school?: Some college  Exercise Current Exercise Habits: The patient does not participate in regular exercise at present, Exercise limited by: orthopedic condition(s)  Diet Patient reports consuming 3 meals a day and 0 snack(s) a day Patient reports that his primary diet is: Regular Patient reports that he does have regular access to food.   Depression Screen PHQ 2/9 Scores 04/16/2021 03/30/2021 10/16/2020 07/15/2020 05/22/2020 04/08/2020 01/10/2020  PHQ - 2 Score 0 0 0 0 0 0 0  PHQ- 9 Score - 0 - - - - -     Fall Risk Fall Risk  05/25/2021 03/30/2021 01/20/2021 10/16/2020 07/15/2020  Falls in the past year? 0 0 0 0 0  Number falls in past yr: - - - - 0  Injury with Fall? - - - - 0  Risk for fall  due to : - - - - No Fall Risks  Follow up Falls evaluation completed - Falls evaluation completed Falls evaluation completed Falls evaluation completed     Objective:  Billy Nolan seemed alert and oriented and he participated appropriately during our telephone visit.  Blood Pressure Weight BMI  BP Readings from Last 3 Encounters:  05/12/21 121/82  04/16/21 (!) 147/92  03/30/21 (!) 146/93   Wt Readings from Last 3 Encounters:  05/12/21 163 lb (73.9 kg)  04/16/21 160 lb 6.4 oz (72.8 kg)  03/30/21 161 lb (73 kg)   BMI Readings from Last 1 Encounters:  05/12/21 25.53 kg/m    *Unable to obtain current vital signs, weight, and BMI due to telephone visit type  Hearing/Vision  Billy Nolan did not seem to have difficulty with hearing/understanding during the telephone conversation Reports that he has had a formal eye exam by an eye care professional within the past year Reports that he has not had a formal hearing evaluation within the past year *Unable to fully assess hearing and vision during telephone visit type  Cognitive Function: 6CIT Screen 05/25/2021 05/22/2020 05/22/2020 04/26/2019  What Year? 0 points 0 points 0 points 0 points  What month? 0 points 0 points 0 points 0 points   What time? 0 points 0 points 0 points 0 points  Count back from 20 0 points 0 points 0 points 0 points  Months in reverse 0 points 0 points - 4 points  Repeat phrase 0 points 4 points - 0 points  Total Score 0 4 - 4   (Normal:0-7, Significant for Dysfunction: >8)  Normal Cognitive Function Screening: Yes   Immunization & Health Maintenance Record Immunization History  Administered Date(s) Administered   Influenza, High Dose Seasonal PF 11/01/2018   Moderna Sars-Covid-2 Vaccination 12/27/2019, 02/01/2020   Pneumococcal Conjugate-13 07/03/2014   Pneumococcal Polysaccharide-23 03/03/2018    Health Maintenance  Topic Date Due   Zoster Vaccines- Shingrix (1 of 2) Never done   COVID-19 Vaccine (3 - Booster for Moderna series) 06/30/2020   TETANUS/TDAP  01/20/2022 (Originally 12/06/2017)   INFLUENZA VACCINE  07/06/2021   HEMOGLOBIN A1C  10/17/2021   OPHTHALMOLOGY EXAM  10/21/2021   FOOT EXAM  04/16/2022   COLONOSCOPY (Pts 45-57yrs Insurance coverage will need to be confirmed)  09/12/2024   Hepatitis C Screening  Completed   PNA vac Low Risk Adult  Completed   HPV VACCINES  Aged Out       Assessment  This is a routine wellness examination for Billy Nolan.  Health Maintenance: Due or Overdue Health Maintenance Due  Topic Date Due   Zoster Vaccines- Shingrix (1 of 2) Never done   COVID-19 Vaccine (3 - Booster for Moderna series) 06/30/2020    Billy Nolan does not need a referral for Community Assistance: Care Management:   no Social Work:    no Prescription Assistance:  no Nutrition/Diabetes Education:  no   Plan:  Personalized Goals  Goals Addressed             This Visit's Progress    Patient Stated       05/25/2021 AWV Goal: Exercise for General Health  Patient will verbalize understanding of the benefits of increased physical activity: Exercising regularly is important. It will improve your overall fitness, flexibility, and endurance. Regular exercise  also will improve your overall health. It can help you control your weight, reduce stress, and improve your bone density. Over the next year, patient will increase physical activity  as tolerated with a goal of at least 150 minutes of moderate physical activity per week.  You can tell that you are exercising at a moderate intensity if your heart starts beating faster and you start breathing faster but can still hold a conversation. Moderate-intensity exercise ideas include: Walking 1 mile (1.6 km) in about 15 minutes Biking Hiking Golfing Dancing Water aerobics Patient will verbalize understanding of everyday activities that increase physical activity by providing examples like the following: Yard work, such as: Sales promotion account executive Gardening Washing windows or floors Patient will be able to explain general safety guidelines for exercising:  Before you start a new exercise program, talk with your health care provider. Do not exercise so much that you hurt yourself, feel dizzy, or get very short of breath. Wear comfortable clothes and wear shoes with good support. Drink plenty of water while you exercise to prevent dehydration or heat stroke. Work out until your breathing and your heartbeat get faster.         Personalized Health Maintenance & Screening Recommendations  Td vaccine  Lung Cancer Screening Recommended: no (Low Dose CT Chest recommended if Age 17-80 years, 30 pack-year currently smoking OR have quit w/in past 15 years) Hepatitis C Screening recommended: no HIV Screening recommended: no  Advanced Directives: Written information was not prepared per patient's request.  Referrals & Orders No orders of the defined types were placed in this encounter.   Follow-up Plan Follow-up with [name], MD as planned Schedule Tdap   I have personally reviewed and noted the following in the  patient's chart:   Medical and social history Use of alcohol, tobacco or illicit drugs  Current medications and supplements Functional ability and status Nutritional status Physical activity Advanced directives List of other physicians Hospitalizations, surgeries, and ER visits in previous 12 months Vitals Screenings to include cognitive, depression, and falls Referrals and appointments  In addition, I have reviewed and discussed with Billy Nolan certain preventive protocols, quality metrics, and best practice recommendations. A written personalized care plan for preventive services as well as general preventive health recommendations is available and can be mailed to the patient at his request.      Felicity Coyer, LPN    [id number]/[id number]    Patient declined after visit summary

## 2021-05-25 NOTE — Telephone Encounter (Signed)
I called the pt and advised of results he verbalized understanding and had no questions/concerns. Pt will keep yearly f/u as scheduled.

## 2021-05-25 NOTE — Telephone Encounter (Signed)
-----   Message from [name], MD sent at 05/25/2021  4:41 PM EDT ----- Please call patient and advise him that his brain MRI with and without contrast did not show any acute findings, mild age related changes were seen, no abnormal contrast uptake.  He can follow-up as scheduled.

## 2021-06-02 ENCOUNTER — Other Ambulatory Visit: Payer: Self-pay

## 2021-06-02 ENCOUNTER — Encounter: Payer: Self-pay | Admitting: Family Medicine

## 2021-06-02 ENCOUNTER — Ambulatory Visit (INDEPENDENT_AMBULATORY_CARE_PROVIDER_SITE_OTHER): Payer: PPO | Admitting: Family Medicine

## 2021-06-02 VITALS — BP 100/64 | HR 104 | Temp 98.1°F | Ht 67.0 in | Wt 159.6 lb

## 2021-06-02 DIAGNOSIS — Z125 Encounter for screening for malignant neoplasm of prostate: Secondary | ICD-10-CM | POA: Diagnosis not present

## 2021-06-02 DIAGNOSIS — Z01818 Encounter for other preprocedural examination: Secondary | ICD-10-CM | POA: Diagnosis not present

## 2021-06-02 DIAGNOSIS — E1141 Type 2 diabetes mellitus with diabetic mononeuropathy: Secondary | ICD-10-CM | POA: Diagnosis not present

## 2021-06-02 DIAGNOSIS — R300 Dysuria: Secondary | ICD-10-CM | POA: Diagnosis not present

## 2021-06-02 NOTE — Addendum Note (Signed)
Addended by: Christia Reading on: 06/02/2021 02:28 PM   Modules accepted: Orders

## 2021-06-02 NOTE — Progress Notes (Signed)
Subjective:  Patient ID: Billy Nolan, male    DOB: [birth year]  Age: 75 y.o. MRN: [id number]  CC: No chief complaint on file.   HPI Osiris Odriscoll presents for surgical clearance for L3-4 lumbar lateral fusion.  He is having 6-8/10 pain in both lower extremities primarily in the thighs.  Sometimes he has numbness in the anterior thighs as well.  He describes the symptoms as soreness tightness and spasm.  Few.  Can be worse on the right than the left at times.  He is a well-controlled diabetic.  He takes Glucophage and Rybelsus for this.  His blood sugars are under good control at home and his most recent A1c approximately 6 weeks ago was 6.9.  He takes famotidine for heartburn but has not had any symptoms lately.  His blood pressure is well controlled with lisinopril 10 mg once daily.  This is also used for kidney protection.  He takes Crestor as result of his diabetes to prevent heart disease and stroke.  He does not take any type of anticoagulation.  Specifically he denies using over-the-counter Aleve, ibuprofen, aspirin, Goody's.  Depression screen [facility] 2/9 06/02/2021 05/25/2021 04/16/2021  Decreased Interest 0 0 0  Down, Depressed, Hopeless 0 0 0  PHQ - 2 Score 0 0 0  Altered sleeping - - -  Tired, decreased energy - - -  Change in appetite - - -  Feeling bad or failure about yourself  - - -  Trouble concentrating - - -  Moving slowly or fidgety/restless - - -  Suicidal thoughts - - -  PHQ-9 Score - - -    History Exavior has a past medical history of Allergy, Diabetes mellitus without complication ([place]), GERD (gastroesophageal reflux disease), Hyperlipidemia, and Hypertension.   He has a past surgical history that includes dental surgeries and Lumbar laminectomy/decompression microdiscectomy (Left, 01/30/2016).   His family history includes Cancer in his sister; Heart disease in his father and mother; Multiple sclerosis in his sister; Stroke in his father; Ulcerative colitis in his son.He  reports that he has never smoked. He has never used smokeless tobacco. He reports that he does not drink alcohol and does not use drugs.    ROS Review of Systems  Constitutional:  Negative for fever.  Respiratory:  Negative for shortness of breath.   Cardiovascular:  Negative for chest pain.  Musculoskeletal:  Negative for arthralgias.  Skin:  Negative for rash.   Objective:  BP 100/64   Pulse (!) 104   Temp 98.1 F (36.7 C)   Ht 5' 7"  (1.702 m)   Wt 159 lb 9.6 oz (72.4 kg)   SpO2 99%   BMI 25.00 kg/m   BP Readings from Last 3 Encounters:  06/02/21 100/64  05/12/21 121/82  04/16/21 (!) 147/92    Wt Readings from Last 3 Encounters:  06/02/21 159 lb 9.6 oz (72.4 kg)  05/12/21 163 lb (73.9 kg)  04/16/21 160 lb 6.4 oz (72.8 kg)     Physical Exam Vitals reviewed.  Constitutional:      Appearance: He is well-developed.  HENT:     Head: Normocephalic and atraumatic.     Right Ear: External ear normal.     Left Ear: External ear normal.     Mouth/Throat:     Pharynx: No oropharyngeal exudate or posterior oropharyngeal erythema.  Eyes:     Pupils: Pupils are equal, round, and reactive to light.  Cardiovascular:     Rate and Rhythm: Normal rate  and regular rhythm.     Heart sounds: No murmur heard. Pulmonary:     Effort: No respiratory distress.     Breath sounds: Normal breath sounds.  Abdominal:     General: Abdomen is flat.     Palpations: Abdomen is soft.     Tenderness: There is no abdominal tenderness.  Musculoskeletal:     Cervical back: Normal range of motion and neck supple.  Neurological:     Mental Status: He is alert and oriented to person, place, and time.      Assessment & Plan:   Diagnoses and all orders for this visit:  Preoperative clearance -     TSH -     CBC with Differential/Platelet -     CMP14+EGFR  Dysuria -     Urinalysis, Complete  Type 2 diabetes mellitus with diabetic mononeuropathy, without long-term current use of  insulin (HCC) -     TSH -     CBC with Differential/Platelet -     CMP14+EGFR  Prostate cancer screening -     PSA, total and free      I am having [name] maintain his FreeStyle Libre 2 Sensor, Rybelsus, rosuvastatin, sildenafil, lisinopril, famotidine, and metFORMIN.  Allergies as of 06/02/2021       Reactions   Sulfa Antibiotics Rash        Medication List        Accurate as of June 02, 2021  2:08 PM. If you have any questions, ask your nurse or doctor.          famotidine 20 MG tablet Commonly known as: PEPCID Take 1 tablet (20 mg total) by mouth daily. For reflux / heartburn   FreeStyle Libre 2 Sensor Misc USE TO CHECK BLOOD SUGAR 6 TIMES DAILY. Dx: E11.9   lisinopril 10 MG tablet Commonly known as: ZESTRIL Take 1 tablet (10 mg total) by mouth daily.   metFORMIN 500 MG 24 hr tablet Commonly known as: GLUCOPHAGE-XR Take 1 tablet (500 mg total) by mouth daily with breakfast.   rosuvastatin 20 MG tablet Commonly known as: CRESTOR Take 1 tablet (20 mg total) by mouth daily.   Rybelsus 7 MG Tabs Generic drug: Semaglutide Take 7 mg by mouth daily.   sildenafil 20 MG tablet Commonly known as: REVATIO Take 2 to 5 daily as needed for intimacy         Follow-up: No follow-ups on file.  [name], M.D.

## 2021-06-03 LAB — CMP14+EGFR
ALT: 24 IU/L (ref 0–44)
AST: 21 IU/L (ref 0–40)
Albumin/Globulin Ratio: 2 (ref 1.2–2.2)
Albumin: 4.6 g/dL (ref 3.7–4.7)
Alkaline Phosphatase: 63 IU/L (ref 44–121)
BUN/Creatinine Ratio: 14 (ref 10–24)
BUN: 19 mg/dL (ref 8–27)
Bilirubin Total: 1.8 mg/dL — ABNORMAL HIGH (ref 0.0–1.2)
CO2: 23 mmol/L (ref 20–29)
Calcium: 10.4 mg/dL — ABNORMAL HIGH (ref 8.6–10.2)
Chloride: 98 mmol/L (ref 96–106)
Creatinine, Ser: 1.34 mg/dL — ABNORMAL HIGH (ref 0.76–1.27)
Globulin, Total: 2.3 g/dL (ref 1.5–4.5)
Glucose: 188 mg/dL — ABNORMAL HIGH (ref 65–99)
Potassium: 4.7 mmol/L (ref 3.5–5.2)
Sodium: 139 mmol/L (ref 134–144)
Total Protein: 6.9 g/dL (ref 6.0–8.5)
eGFR: 56 mL/min/{1.73_m2} — ABNORMAL LOW (ref >59)

## 2021-06-03 LAB — CBC WITH DIFFERENTIAL/PLATELET
Basophils Absolute: 0 10*3/uL (ref 0.0–0.2)
Basos: 1 %
EOS (ABSOLUTE): 0.1 10*3/uL (ref 0.0–0.4)
Eos: 1 %
Hematocrit: 48.9 % (ref 37.5–51.0)
Hemoglobin: 16.7 g/dL (ref 13.0–17.7)
Immature Grans (Abs): 0 10*3/uL (ref 0.0–0.1)
Immature Granulocytes: 0 %
Lymphocytes Absolute: 1 10*3/uL (ref 0.7–3.1)
Lymphs: 19 %
MCH: 31.5 pg (ref 26.6–33.0)
MCHC: 34.2 g/dL (ref 31.5–35.7)
MCV: 92 fL (ref 79–97)
Monocytes Absolute: 0.6 10*3/uL (ref 0.1–0.9)
Monocytes: 11 %
Neutrophils Absolute: 3.6 10*3/uL (ref 1.4–7.0)
Neutrophils: 68 %
Platelets: 193 10*3/uL (ref 150–450)
RBC: 5.31 x10E6/uL (ref 4.14–5.80)
RDW: 12.9 % (ref 11.6–15.4)
WBC: 5.2 10*3/uL (ref 3.4–10.8)

## 2021-06-03 LAB — PSA, TOTAL AND FREE
PSA, Free Pct: 26 %
PSA, Free: 0.13 ng/mL
Prostate Specific Ag, Serum: 0.5 ng/mL (ref 0.0–4.0)

## 2021-06-03 LAB — TSH: TSH: 4.28 u[IU]/mL (ref 0.450–4.500)

## 2021-06-04 LAB — URINE CULTURE: Organism ID, Bacteria: NO GROWTH

## 2021-06-05 ENCOUNTER — Ambulatory Visit: Payer: Self-pay | Admitting: Orthopedic Surgery

## 2021-06-05 DIAGNOSIS — M5459 Other low back pain: Secondary | ICD-10-CM | POA: Diagnosis not present

## 2021-06-05 NOTE — H&P (Signed)
Subjective:   Reported by patient. Location: bilateral (leg pain R>L); Anterior thigh, also has back pain Quality: aching; burning; stabbing; sharp Severity: pain level 5/10 Duration: 2 years Timing: chronic; intermittent episodes lasting: Alleviating Factors: walking Aggravating Factors: sitting; standing; lying down; going from sit to stand Associated Symptoms: no change in bowel/bladder habits; weakness; numbness; radiation down leg Previous Surgery: surgical procedure:; date: (2017 L3-4 discectomy Dr. Darnell Level) Prior Imaging: x ray; MRI (EO 04-06-21) Previous Injections: did not help Previous PT: none Billy Nolan is a very pleasant 75 year old gentleman who had an L3-4 decompression several years ago and now has ongoing progressive back buttock and neuropathic right leg pain. Patient describes predominant pain in the lateral and anterior aspect of thigh. He notes that his back pain is debilitating. When the back he gets significant then it begins to cause neuropathic leg pain. He does feel some's discomfort on the left side but the right side is worse. Discussed treatment options including surgical intervention and risks and benefits of surgery. Patient elected to move forward with surgery. He is scheduled for an L3-4 extreme lateral interbody fusion with posterior supplemental fixation with Dr. Rolena Infante at [facility] on 06/17/2021   Patient Active Problem List   Diagnosis Date Noted   Spinal stenosis, lumbar region, with neurogenic claudication 01/30/2016   DM (diabetes mellitus) ([place]) 10/24/2013   Hyperlipidemia 10/24/2013   Past Medical History:  Diagnosis Date   Allergy    Diabetes mellitus without complication ([place])    GERD (gastroesophageal reflux disease)    Hyperlipidemia    Hypertension     Past Surgical History:  Procedure Laterality Date   dental surgeries     teeth extractions, root cannal   LUMBAR LAMINECTOMY/DECOMPRESSION MICRODISCECTOMY Left 01/30/2016   Procedure: LUMBAR  LAMINECTOMY/CENTRAL DECOMPRESSION MICRODISCECTOMY L3 - L4 ON THE LEFT;  Surgeon: Latanya Maudlin, MD;  Location: [facility] ORS;  Service: Orthopedics;  Laterality: Left;    Current Outpatient Medications  Medication Sig Dispense Refill Last Dose   Continuous Blood Gluc Sensor (FREESTYLE LIBRE 2 SENSOR) MISC USE TO CHECK BLOOD SUGAR 6 TIMES DAILY. Dx: E11.9 2 each 11    famotidine (PEPCID) 20 MG tablet Take 1 tablet (20 mg total) by mouth daily. For reflux / heartburn (Patient taking differently: Take 20 mg by mouth daily as needed for heartburn or indigestion.) 90 tablet 3    gabapentin (NEURONTIN) 300 MG capsule Take 300 mg by mouth daily as needed for pain.      lisinopril (ZESTRIL) 10 MG tablet Take 1 tablet (10 mg total) by mouth daily. 90 tablet 1    metFORMIN (GLUCOPHAGE-XR) 500 MG 24 hr tablet Take 1 tablet (500 mg total) by mouth daily with breakfast. 90 tablet 1    rosuvastatin (CRESTOR) 20 MG tablet Take 1 tablet (20 mg total) by mouth daily. 90 tablet 3    Semaglutide (RYBELSUS) 7 MG TABS Take 7 mg by mouth daily. 90 tablet 0    sildenafil (REVATIO) 20 MG tablet Take 2 to 5 daily as needed for intimacy 60 tablet 5    No current facility-administered medications for this visit.   Allergies  Allergen Reactions   Sulfa Antibiotics Rash    Social History   Tobacco Use   Smoking status: Never   Smokeless tobacco: Never  Substance Use Topics   Alcohol use: No    Family History  Problem Relation Age of Onset   Heart disease Mother    Stroke Father    Heart disease Father  Multiple sclerosis Sister    Cancer Sister    Ulcerative colitis Son    Colon cancer Neg Hx     Review of Systems As stated in HPI  Objective:   Vitals: Ht: 5 ft 6.25 in BP: 140/90  General: AAOX3, well developed and well nourished, NAD Ambulation: antalgic fwd flexed gait pattern, uses no assistive device. Inspection: No obvious deformity Heart: Regular rate and rhythm, no rubs, murmurs, or  gallops Lungs: Clear auscultation bilaterally Abdomen: Bowel sounds 4, nondistended, nontender, no rebound tenderness. Palpation: Non-tender over spinous processes and paraspinal muscles. AROM: - Significant pain with ROM of lumbar spine. - Knee: flexion and extension normal and pain free bilaterally. - Ankle: Dorsiflexion, plantarflexion, inversion, eversion normal and pain free. Dermatomes: Lower extremity sensation to light touch abnormal. Pain and dysetheias in the anterior thighs R>L consistent with L3/4 dermatome pattern. Myotomes: - Hip Flexion: Left 5/5, Right 5/5 - Knee Extension: Left 5/5, Right 5/5 - Ankle Dorsiflextion: Left 5/5, Right 5/5 - Ankle Plantarflexion: Left 5/5, Right 5/5 Reflexes: - Clonus: Negative Special Tests: - Straight Leg Raise: Left Negative, Right Negative - Femoral Nerve Stretch Test: Left Negative, Right Negative PV: Extremities warm and well profused. Posterior and dorsalis pedis pulse 2+ bilaterally, No pitting Edema, discoloration, calf tendernes X-Ray impression: MRI Impression: MRI of lumbar spine dated 04/06/2021 performed at [facility]. Images as well as report were personally reviewed by me and Dr. Rolena Infante and discussed with the patient. At L3-4 there is previous laminectomies. There is a severe bulging disc osteophyte complex with moderate left and subarticular right central disc extrusion measuring 11 x 17 x 12 mm causing severe left lateral recess narrowing with impingement upon the left L4 nerve root and moderate right subarticular zone narrowing impinging upon the descending right L4 nerve roots. There is moderate severe disc space loss at this level asymmetric to the left. Moderate to severe left and moderate right neural foraminal narrowing with impingement on the left L3 nerve roots and contact of the right L3 nerve roots. At L4-5 there is also severe bulging severe bulging disc osteophyte complex asymmetric to the right  Assessment:     Billy Nolan is a very pleasant 75 year old gentleman who had an L3-4 decompression several years ago and now has ongoing progressive back buttock and neuropathic right leg pain. Patient describes predominant pain in the lateral and anterior aspect of thigh. He notes that his back pain is debilitating. When the back he gets significant then it begins to cause neuropathic leg pain. He does feel some's discomfort on the left side but the right side is worse. Imaging studies demonstrate recurrent lateral recess and foraminal stenosis due to a large disc osteophyte complex at L3-4 with degenerative L3-4 disc disease. There is also significant lateral recess stenosis affecting the traversing L4 nerve root. Moderate to severe loss of disc space height producing foraminal stenosis. While there are degenerative changes at L4-5 it only produces moderate foraminal narrowing and mild to moderate lateral recess stenosis no significant central stenosis. L5-S1 similarly patient has disc bulge osteophyte complex with some stenosis but not significant. At L2-3 there is a small disc fragment asymmetric to the right but it is not causing spinal canal stenosis or contact of the descending nerve root.   Plan:    At this point time the patient's imaging studies and clinical exam are most consistent with L3 nerve irritation along with discogenic back pain. I have had a long conversation with the patient and his  wife. And I think surgical intervention is reasonable. We have discussed ongoing conservative care consisting of physical therapy and injections but he states it is not helped in the past and would like to move forward with surgery. While he does have multilevel degenerative changes I believe the primary pain source is the L3-4 level. With this in mind have recommended a lateral interbody fusion at L3-4. This would remove the painful disc and restore the intervertebral disc space height. This would address the lateral recess  and foraminal stenosis thereby addressing the neuropathic leg pain. I would supplement this with the posterior pedicle screw construct. If he continued to have significant neuropathic leg pain then at that point we can discuss revision posterior decompression. OLIF/XLIF risks, benefits of surgery were reviewed with the patient. These include: infection, bleeding, death, stroke, paralysis, ongoing or worse pain, need for additional surgery, injury to the lumbar plexus resulting in hip flexor weakness and difficulty walking without assistive devices. Adjacent segment degenerative disease, need for additional surgery including fusing other levels, leak of spinal fluid, Nonunion, hardware failure, breakage, or mal-position. Deep venous thrombosis (DVT) requiring additional treatment such as filter, and/or medications. Injury to abdominal contents, loss in bowel and bladder control. Risks and benefits of spinal fusion: Infection, bleeding, death, stroke, paralysis, ongoing or worse pain, need for additional surgery, nonunion, leak of spinal fluid, adjacent segment degeneration requiring additional fusion surgery, Injury to abdominal vessels that can require anterior surgery to stop bleeding. Malposition of the cage and/or pedicle screws that could require additional surgery. Loss of bowel and bladder control. Postoperative hematoma causing neurologic compression that could require urgent or emergent re-operation.  We will obtain preoperative medical clearance from the patient's primary care provider.  Reviewed patient's medication list with him. He is not on any blood thinners. Not using any aspirin. No NSAIDs.  Patient is scheduled to see physical therapy for LSO brace fitting  We have also discussed the post-operative recovery period to include: bathing/showering restrictions, wound healing, activity (and driving) restrictions, medications/pain mangement.  We have also discussed post-operative redflags to  include: signs and symptoms of postoperative infection, DVT/PE.  Discharge instructions reviewed with the patient at today's office visit he and his wife both expressed understanding of these.  Follow-up: 2 weeks postop

## 2021-06-05 NOTE — H&P (Deleted)
  The note originally documented on this encounter has been moved the the encounter in which it belongs.  

## 2021-06-09 LAB — URINALYSIS, COMPLETE
Bilirubin, UA: NEGATIVE
Leukocytes,UA: NEGATIVE
Nitrite, UA: NEGATIVE
RBC, UA: NEGATIVE
Specific Gravity, UA: 1.03 (ref 1.005–1.030)
Urobilinogen, Ur: 1 mg/dL (ref 0.2–1.0)
pH, UA: 5 (ref 5.0–7.5)

## 2021-06-09 LAB — MICROSCOPIC EXAMINATION

## 2021-06-12 NOTE — Pre-Procedure Instructions (Signed)
Surgical Instructions       Your procedure is scheduled on Wednesdday, July 13th.  Report to [facility] Main Entrance "A" at 10:00 A.M., then check in with the Admitting office.  Call this number if you have problems the morning of surgery:  [phone]   If you have any questions prior to your surgery date call [phone]: Open Monday-Friday 8am-4pm    Remember:  Do not eat after midnight the night before your surgery  You may drink clear liquids until 9:00 a.m. the morning of your surgery.   Clear liquids allowed are: Water, Non-Citrus Juices (without pulp), Carbonated Beverages, Clear Tea, Black Coffee Only, and Gatorade    Take these medicines the morning of surgery with A SIP OF WATER  rosuvastatin (CRESTOR)    Take these medicines as needed the morning of surgery famotidine (PEPCID) gabapentin (NEURONTIN)  As of today, STOP taking any Aspirin (unless otherwise instructed by your surgeon) Aleve, Naproxen, Ibuprofen, Motrin, Advil, Goody's, BC's, all herbal medications, fish oil, and all vitamins.          WHAT DO I DO ABOUT MY DIABETES MEDICATION?  Do not take metFORMIN (GLUCOPHAGE-XR) or Semaglutide (RYBELSUS)  the morning of surgery.   HOW TO MANAGE YOUR DIABETES BEFORE AND AFTER SURGERY  Why is it important to control my blood sugar before and after surgery? Improving blood sugar levels before and after surgery helps healing and can limit problems. A way of improving blood sugar control is eating a healthy diet by:  Eating less sugar and carbohydrates  Increasing activity/exercise  Talking with your doctor about reaching your blood sugar goals High blood sugars (greater than 180 mg/dL) can raise your risk of infections and slow your recovery, so you will need to focus on controlling your diabetes during the weeks before surgery. Make sure that the doctor who takes care of your diabetes knows about your planned surgery including the date and location.  How do I  manage my blood sugar before surgery? Check your blood sugar at least 4 times a day, starting 2 days before surgery, to make sure that the level is not too high or low.  Check your blood sugar the morning of your surgery when you wake up and every 2 hours until you get to the Short Stay unit.  If your blood sugar is less than 70 mg/dL, you will need to treat for low blood sugar: Do not take insulin. Treat a low blood sugar (less than 70 mg/dL) with  cup of clear juice (cranberry or apple), 4 glucose tablets, OR glucose gel. Recheck blood sugar in 15 minutes after treatment (to make sure it is greater than 70 mg/dL). If your blood sugar is not greater than 70 mg/dL on recheck, call [phone] for further instructions. Report your blood sugar to the short stay nurse when you get to Short Stay.  If you are admitted to the hospital after surgery: Your blood sugar will be checked by the staff and you will probably be given insulin after surgery (instead of oral diabetes medicines) to make sure you have good blood sugar levels. The goal for blood sugar control after surgery is 80-180 mg/dL.             Do NOT Smoke (Tobacco/Vaping) or drink Alcohol 24 hours prior to your procedure.  If you use a CPAP at night, you may bring all equipment for your overnight stay.   Contacts, glasses, piercing's, hearing aid's, dentures or partials may not  be worn into surgery, please bring cases for these belongings.    For patients admitted to the hospital, discharge time will be determined by your treatment team.   Patients discharged the day of surgery will not be allowed to drive home, and someone needs to stay with them for 24 hours.  ONLY 1 SUPPORT PERSON MAY BE PRESENT WHILE YOU ARE IN SURGERY. IF YOU ARE TO BE ADMITTED ONCE YOU ARE IN YOUR ROOM YOU WILL BE ALLOWED TWO (2) VISITORS.  Minor children may have two parents present. Special consideration for safety and communication needs will be reviewed on  a case by case basis.   Special instructions:   [facility]- Preparing For Surgery  Before surgery, you can play an important role. Because skin is not sterile, your skin needs to be as free of germs as possible. You can reduce the number of germs on your skin by washing with CHG (chlorahexidine gluconate) Soap before surgery.  CHG is an antiseptic cleaner which kills germs and bonds with the skin to continue killing germs even after washing.    Oral Hygiene is also important to reduce your risk of infection.  Remember - BRUSH YOUR TEETH THE MORNING OF SURGERY WITH YOUR REGULAR TOOTHPASTE  Please do not use if you have an allergy to CHG or antibacterial soaps. If your skin becomes reddened/irritated stop using the CHG.  Do not shave (including legs and underarms) for at least 48 hours prior to first CHG shower. It is OK to shave your face.  Please follow these instructions carefully.   Shower the NIGHT BEFORE SURGERY and the MORNING OF SURGERY  If you chose to wash your hair, wash your hair first as usual with your normal shampoo.  After you shampoo, rinse your hair and body thoroughly to remove the shampoo.  Use CHG Soap as you would any other liquid soap. You can apply CHG directly to the skin and wash gently with a scrungie or a clean washcloth.   Apply the CHG Soap to your body ONLY FROM THE NECK DOWN.  Do not use on open wounds or open sores. Avoid contact with your eyes, ears, mouth and genitals (private parts). Wash Face and genitals (private parts)  with your normal soap.   Wash thoroughly, paying special attention to the area where your surgery will be performed.  Thoroughly rinse your body with warm water from the neck down.  DO NOT shower/wash with your normal soap after using and rinsing off the CHG Soap.  Pat yourself dry with a CLEAN TOWEL.  Wear CLEAN PAJAMAS to bed the night before surgery  Place CLEAN SHEETS on your bed the night before your surgery  DO NOT SLEEP  WITH PETS.   Day of Surgery: Shower with CHG soap. Do not wear jewelry. Do not wear lotions, powders, colognes, or deodorant. Men may shave face and neck. Do not bring valuables to the hospital. [facility] is not responsible for any belongings or valuables. Wear Clean/Comfortable clothing the morning of surgery Remember to brush your teeth WITH YOUR REGULAR TOOTHPASTE.   Please read over the following fact sheets that you were given.

## 2021-06-15 ENCOUNTER — Encounter (HOSPITAL_COMMUNITY): Payer: Self-pay

## 2021-06-15 ENCOUNTER — Other Ambulatory Visit: Payer: Self-pay

## 2021-06-15 ENCOUNTER — Ambulatory Visit (HOSPITAL_COMMUNITY)
Admission: RE | Admit: 2021-06-15 | Discharge: 2021-06-15 | Disposition: A | Discharge status: Home / Self Care | Payer: PPO | Source: Ambulatory Visit | Attending: Orthopedic Surgery | Admitting: Orthopedic Surgery

## 2021-06-15 ENCOUNTER — Encounter (HOSPITAL_COMMUNITY)
Admission: RE | Admit: 2021-06-15 | Discharge: 2021-06-15 | Disposition: A | Discharge status: Home / Self Care | Payer: PPO | Source: Ambulatory Visit | Attending: Orthopedic Surgery | Admitting: Orthopedic Surgery

## 2021-06-15 DIAGNOSIS — Z809 Family history of malignant neoplasm, unspecified: Secondary | ICD-10-CM | POA: Diagnosis not present

## 2021-06-15 DIAGNOSIS — Z820 Family history of epilepsy and other diseases of the nervous system: Secondary | ICD-10-CM | POA: Diagnosis not present

## 2021-06-15 DIAGNOSIS — Z79899 Other long term (current) drug therapy: Secondary | ICD-10-CM | POA: Diagnosis not present

## 2021-06-15 DIAGNOSIS — M5116 Intervertebral disc disorders with radiculopathy, lumbar region: Secondary | ICD-10-CM | POA: Diagnosis present

## 2021-06-15 DIAGNOSIS — Z7984 Long term (current) use of oral hypoglycemic drugs: Secondary | ICD-10-CM | POA: Diagnosis not present

## 2021-06-15 DIAGNOSIS — E119 Type 2 diabetes mellitus without complications: Secondary | ICD-10-CM | POA: Diagnosis present

## 2021-06-15 DIAGNOSIS — Z981 Arthrodesis status: Secondary | ICD-10-CM | POA: Diagnosis not present

## 2021-06-15 DIAGNOSIS — Z823 Family history of stroke: Secondary | ICD-10-CM | POA: Diagnosis not present

## 2021-06-15 DIAGNOSIS — J9811 Atelectasis: Secondary | ICD-10-CM | POA: Diagnosis not present

## 2021-06-15 DIAGNOSIS — Z8249 Family history of ischemic heart disease and other diseases of the circulatory system: Secondary | ICD-10-CM | POA: Diagnosis not present

## 2021-06-15 DIAGNOSIS — Z01818 Encounter for other preprocedural examination: Secondary | ICD-10-CM | POA: Diagnosis not present

## 2021-06-15 DIAGNOSIS — Z9889 Other specified postprocedural states: Secondary | ICD-10-CM | POA: Insufficient documentation

## 2021-06-15 DIAGNOSIS — G8918 Other acute postprocedural pain: Secondary | ICD-10-CM | POA: Diagnosis not present

## 2021-06-15 DIAGNOSIS — I10 Essential (primary) hypertension: Secondary | ICD-10-CM | POA: Diagnosis present

## 2021-06-15 DIAGNOSIS — M48062 Spinal stenosis, lumbar region with neurogenic claudication: Secondary | ICD-10-CM | POA: Diagnosis present

## 2021-06-15 DIAGNOSIS — E785 Hyperlipidemia, unspecified: Secondary | ICD-10-CM | POA: Diagnosis present

## 2021-06-15 DIAGNOSIS — K219 Gastro-esophageal reflux disease without esophagitis: Secondary | ICD-10-CM | POA: Diagnosis present

## 2021-06-15 DIAGNOSIS — Z20822 Contact with and (suspected) exposure to covid-19: Secondary | ICD-10-CM | POA: Diagnosis present

## 2021-06-15 DIAGNOSIS — M4326 Fusion of spine, lumbar region: Secondary | ICD-10-CM | POA: Diagnosis not present

## 2021-06-15 DIAGNOSIS — M961 Postlaminectomy syndrome, not elsewhere classified: Secondary | ICD-10-CM | POA: Diagnosis present

## 2021-06-15 LAB — URINALYSIS, ROUTINE W REFLEX MICROSCOPIC
Bacteria, UA: NONE SEEN
Bilirubin Urine: NEGATIVE
Glucose, UA: >=500 mg/dL — AB
Hgb urine dipstick: NEGATIVE
Ketones, ur: NEGATIVE mg/dL
Leukocytes,Ua: NEGATIVE
Nitrite: NEGATIVE
Protein, ur: NEGATIVE mg/dL
Specific Gravity, Urine: 1.015 (ref 1.005–1.030)
pH: 5 (ref 5.0–8.0)

## 2021-06-15 LAB — BASIC METABOLIC PANEL
Anion gap: 9 (ref 5–15)
BUN: 11 mg/dL (ref 8–23)
CO2: 25 mmol/L (ref 22–32)
Calcium: 9.9 mg/dL (ref 8.9–10.3)
Chloride: 101 mmol/L (ref 98–111)
Creatinine, Ser: 0.91 mg/dL (ref 0.61–1.24)
GFR, Estimated: >60 mL/min (ref >60)
Glucose, Bld: 189 mg/dL — ABNORMAL HIGH (ref 70–99)
Potassium: 3.8 mmol/L (ref 3.5–5.1)
Sodium: 135 mmol/L (ref 135–145)

## 2021-06-15 LAB — PROTIME-INR
INR: 1 (ref 0.8–1.2)
Prothrombin Time: 12.8 seconds (ref 11.4–15.2)

## 2021-06-15 LAB — CBC
HCT: 49.2 % (ref 39.0–52.0)
Hemoglobin: 16.6 g/dL (ref 13.0–17.0)
MCH: 31 pg (ref 26.0–34.0)
MCHC: 33.7 g/dL (ref 30.0–36.0)
MCV: 91.8 fL (ref 80.0–100.0)
Platelets: 256 10*3/uL (ref 150–400)
RBC: 5.36 MIL/uL (ref 4.22–5.81)
RDW: 12.2 % (ref 11.5–15.5)
WBC: 8 10*3/uL (ref 4.0–10.5)
nRBC: 0 % (ref 0.0–0.2)

## 2021-06-15 LAB — HEMOGLOBIN A1C
Hgb A1c MFr Bld: 8.4 % — ABNORMAL HIGH (ref 4.8–5.6)
Mean Plasma Glucose: 194.38 mg/dL

## 2021-06-15 LAB — TYPE AND SCREEN
ABO/RH(D): AB POS
Antibody Screen: NEGATIVE

## 2021-06-15 LAB — SURGICAL PCR SCREEN
MRSA, PCR: NEGATIVE
Staphylococcus aureus: NEGATIVE

## 2021-06-15 LAB — APTT: aPTT: 26 seconds (ref 24–36)

## 2021-06-15 LAB — GLUCOSE, CAPILLARY: Glucose-Capillary: 186 mg/dL — ABNORMAL HIGH (ref 70–99)

## 2021-06-15 NOTE — Progress Notes (Addendum)
Inez, PA-C regarding elevated A1C.

## 2021-06-15 NOTE — Progress Notes (Signed)
PCP - [name], MD Cardiologist - Denies  PPM/ICD - Denies  Chest x-ray - 06/15/21 EKG - 06/15/21 Stress Test - Per pt, > 10 years ago, results were normal ECHO - Denies Cardiac Cath - Denies  Sleep Study - Denies  Fasting Blood Sugar - 150s Checks Blood Sugar continuously with freestyle Libre CBG at PAT appointment was 186. A1C obtained  Blood Thinner Instructions: N/A Aspirin Instructions: N/A  ERAS Protcol - Yes PRE-SURGERY Ensure or G2- N/A  COVID TEST- 06/15/21; results pending   Anesthesia review: Yes, per MD order  Patient denies shortness of breath, fever, cough and chest pain at PAT appointment   All instructions explained to the patient, with a verbal understanding of the material. Patient agrees to go over the instructions while at home for a better understanding. Patient also instructed to self quarantine after being tested for COVID-19. The opportunity to ask questions was provided.

## 2021-06-16 LAB — SARS CORONAVIRUS 2 (TAT 6-24 HRS): SARS Coronavirus 2: NEGATIVE

## 2021-06-16 NOTE — Anesthesia Preprocedure Evaluation (Addendum)
Anesthesia Evaluation  Patient identified by MRN, date of birth, ID band Patient awake    Reviewed: Allergy & Precautions, NPO status , Patient's Chart, lab work & pertinent test results  History of Anesthesia Complications Negative for: history of anesthetic complications  Airway Mallampati: II  TM Distance: >3 FB Neck ROM: Full    Dental no notable dental hx. (+) Dental Advisory Given   Pulmonary neg pulmonary ROS,    Pulmonary exam normal        Cardiovascular hypertension, Pt. on medications Normal cardiovascular exam     Neuro/Psych negative neurological ROS     GI/Hepatic Neg liver ROS,   Endo/Other  diabetes  Renal/GU negative Renal ROS     Musculoskeletal negative musculoskeletal ROS (+)   Abdominal   Peds  Hematology negative hematology ROS (+)   Anesthesia Other Findings   Reproductive/Obstetrics                           Anesthesia Physical Anesthesia Plan  ASA: 3  Anesthesia Plan: General   Post-op Pain Management:  Regional for Post-op pain   Induction: Intravenous  PONV Risk Score and Plan: 3 and Ondansetron, Dexamethasone and Diphenhydramine  Airway Management Planned: Oral ETT  Additional Equipment:   Intra-op Plan:   Post-operative Plan: Extubation in OR  Informed Consent: I have reviewed the patients History and Physical, chart, labs and discussed the procedure including the risks, benefits and alternatives for the proposed anesthesia with the patient or authorized representative who has indicated his/her understanding and acceptance.     Dental advisory given  Plan Discussed with: CRNA and Anesthesiologist  Anesthesia Plan Comments: (PAT note written 06/16/2021 by Myra Gianotti, PA-C. )      Anesthesia Quick Evaluation

## 2021-06-16 NOTE — Progress Notes (Signed)
Anesthesia Chart Review:  Case: [id number] Date/Time: 06/17/21 1145   Procedure: ANTERIOR LATERAL LUMBAR FUSION WITH PERCUTANEOUS SCREW 1 LEVEL (XLIF with PSFI) L3-4 - 5 hrs Left tap block with exparel 3C bed post op   Anesthesia type: General   Pre-op diagnosis: Post laminectomy syndrome with degenerative disc disease with radiculopathy L3-4   Location: [facility] OR ROOM 04 / [place] [facility]   Surgeons: Melina Schools, MD       DISCUSSION: Patient is a 75 year old male scheduled for the above procedure.  History includes number smoker, DM2, HLD, GERD, HTN, back surgery (L3-4 microdiscectomy, laminectomy 01/30/16).  He had preoperative evaluation by PCP Dr. Livia Snellen on 06/02/2021. A1c 8.4% with PAT labs, up from 6.9% two months ago.  Reports home CBGs in the 150's. A1c result called to [facility] at Dr. Rolena Infante' office.    06/15/2021 presurgical COVID-19 test negative.  Anesthesia team to evaluate on the day of surgery.  06/15/2021 presurgical CXR still in process.    VS: BP (!) 143/87   Pulse 98   Temp 36.6 C (Oral)   Resp 18   Ht 5\' 8"  (1.727 m)   Wt 73.8 kg   SpO2 98%   BMI 24.72 kg/m   PROVIDERS: [name], MD is PCP    LABS: Labs reviewed: Acceptable for surgery.  A1c 8.4%, up from 6.9% 04/16/21. PAT RN sent communication regarding A1c results to orthopedic PA.  He reports home fasting CBGs in the 150s.  He has a Freestyle Libre glucose monitoring device. (all labs ordered are listed, but only abnormal results are displayed)  Labs Reviewed  GLUCOSE, CAPILLARY - Abnormal; Notable for the following components:      Result Value   Glucose-Capillary 186 (*)    All other components within normal limits  HEMOGLOBIN A1C - Abnormal; Notable for the following components:   Hgb A1c MFr Bld 8.4 (*)    All other components within normal limits  BASIC METABOLIC PANEL - Abnormal; Notable for the following components:   Glucose, Bld 189 (*)    All other components within normal limits  URINALYSIS,  ROUTINE W REFLEX MICROSCOPIC - Abnormal; Notable for the following components:   Glucose, UA >=500 (*)    All other components within normal limits  SURGICAL PCR SCREEN  SARS CORONAVIRUS 2 (TAT 6-24 HRS)  CBC  PROTIME-INR  APTT  TYPE AND SCREEN     IMAGES: CXR 06/15/21: In process.  MRI Brain 05/22/21: IMPRESSION: Abnormal MRI scan of the brain with and without contrast showing mild age-related changes of chronic small vessel disease and generalized cerebral atrophy.  Arranger and changes of chronic paranasal sinusitis.  No enhancing lesions are noted.     EKG: 06/15/21: Normal sinus rhythm Left axis deviation Abnormal ECG Since last tracing rate slower Confirmed by Candee Furbish [phone]) on 06/16/2021 6:45:54 AM  CV: Reported a normal stress test greater than 10 years ago.  Past Medical History:  Diagnosis Date   Allergy    Diabetes mellitus without complication (H[facility])    GERD (gastroesophageal reflux disease)    Hyperlipidemia    Hypertension     Past Surgical History:  Procedure Laterality Date   dental surgeries     teeth extractions, root cannal   LUMBAR LAMINECTOMY/DECOMPRESSION MICRODISCECTOMY Left 01/30/2016   Procedure: LUMBAR LAMINECTOMY/CENTRAL DECOMPRESSION MICRODISCECTOMY L3 - L4 ON THE LEFT;  Surgeon: Latanya Maudlin, MD;  Location: [facility] ORS;  Service: Orthopedics;  Laterality: Left;    MEDICATIONS:  Continuous Blood Gluc Sensor (  FREESTYLE LIBRE 2 SENSOR) MISC   famotidine (PEPCID) 20 MG tablet   gabapentin (NEURONTIN) 300 MG capsule   lisinopril (ZESTRIL) 10 MG tablet   metFORMIN (GLUCOPHAGE-XR) 500 MG 24 hr tablet   rosuvastatin (CRESTOR) 20 MG tablet   Semaglutide (RYBELSUS) 7 MG TABS   sildenafil (REVATIO) 20 MG tablet   No current facility-administered medications for this encounter.    Myra Gianotti, PA-C Surgical Short Stay/Anesthesiology [facility] Phone [phone] [facility] Phone [phone] 06/16/2021 2:32 PM

## 2021-06-17 ENCOUNTER — Inpatient Hospital Stay (HOSPITAL_COMMUNITY): Payer: PPO

## 2021-06-17 ENCOUNTER — Encounter (HOSPITAL_COMMUNITY): Payer: Self-pay | Admitting: Orthopedic Surgery

## 2021-06-17 ENCOUNTER — Inpatient Hospital Stay (HOSPITAL_COMMUNITY): Payer: PPO | Admitting: Certified Registered Nurse Anesthetist

## 2021-06-17 ENCOUNTER — Inpatient Hospital Stay (HOSPITAL_COMMUNITY): Payer: PPO | Admitting: Vascular Surgery

## 2021-06-17 ENCOUNTER — Inpatient Hospital Stay (HOSPITAL_COMMUNITY)
Admission: RE | Admit: 2021-06-17 | Discharge: 2021-06-19 | DRG: 460 | Disposition: A | Discharge status: Home / Self Care | Payer: PPO | Attending: Orthopedic Surgery | Admitting: Orthopedic Surgery

## 2021-06-17 ENCOUNTER — Encounter (HOSPITAL_COMMUNITY)
Admission: RE | Disposition: A | Discharge status: Home / Self Care | Payer: Self-pay | Source: Home / Self Care | Attending: Orthopedic Surgery

## 2021-06-17 DIAGNOSIS — M4326 Fusion of spine, lumbar region: Secondary | ICD-10-CM | POA: Diagnosis not present

## 2021-06-17 DIAGNOSIS — Z809 Family history of malignant neoplasm, unspecified: Secondary | ICD-10-CM | POA: Diagnosis not present

## 2021-06-17 DIAGNOSIS — K219 Gastro-esophageal reflux disease without esophagitis: Secondary | ICD-10-CM | POA: Diagnosis present

## 2021-06-17 DIAGNOSIS — I10 Essential (primary) hypertension: Secondary | ICD-10-CM | POA: Diagnosis present

## 2021-06-17 DIAGNOSIS — M961 Postlaminectomy syndrome, not elsewhere classified: Principal | ICD-10-CM | POA: Diagnosis present

## 2021-06-17 DIAGNOSIS — E119 Type 2 diabetes mellitus without complications: Secondary | ICD-10-CM | POA: Diagnosis present

## 2021-06-17 DIAGNOSIS — Z7984 Long term (current) use of oral hypoglycemic drugs: Secondary | ICD-10-CM | POA: Diagnosis not present

## 2021-06-17 DIAGNOSIS — Z79899 Other long term (current) drug therapy: Secondary | ICD-10-CM

## 2021-06-17 DIAGNOSIS — M5116 Intervertebral disc disorders with radiculopathy, lumbar region: Secondary | ICD-10-CM | POA: Diagnosis present

## 2021-06-17 DIAGNOSIS — Z820 Family history of epilepsy and other diseases of the nervous system: Secondary | ICD-10-CM | POA: Diagnosis not present

## 2021-06-17 DIAGNOSIS — Z823 Family history of stroke: Secondary | ICD-10-CM | POA: Diagnosis not present

## 2021-06-17 DIAGNOSIS — Z8249 Family history of ischemic heart disease and other diseases of the circulatory system: Secondary | ICD-10-CM

## 2021-06-17 DIAGNOSIS — M48062 Spinal stenosis, lumbar region with neurogenic claudication: Secondary | ICD-10-CM | POA: Diagnosis present

## 2021-06-17 DIAGNOSIS — E785 Hyperlipidemia, unspecified: Secondary | ICD-10-CM | POA: Diagnosis present

## 2021-06-17 DIAGNOSIS — Z419 Encounter for procedure for purposes other than remedying health state, unspecified: Secondary | ICD-10-CM

## 2021-06-17 DIAGNOSIS — Z981 Arthrodesis status: Secondary | ICD-10-CM | POA: Diagnosis not present

## 2021-06-17 DIAGNOSIS — Z20822 Contact with and (suspected) exposure to covid-19: Secondary | ICD-10-CM | POA: Diagnosis present

## 2021-06-17 HISTORY — PX: ANTERIOR LATERAL LUMBAR FUSION WITH PERCUTANEOUS SCREW 1 LEVEL: SHX5553

## 2021-06-17 LAB — GLUCOSE, CAPILLARY
Glucose-Capillary: 217 mg/dL — ABNORMAL HIGH (ref 70–99)
Glucose-Capillary: 144 mg/dL — ABNORMAL HIGH (ref 70–99)
Glucose-Capillary: 243 mg/dL — ABNORMAL HIGH (ref 70–99)

## 2021-06-17 SURGERY — ANTERIOR LATERAL LUMBAR FUSION WITH PERCUTANEOUS SCREW 1 LEVEL
Anesthesia: General

## 2021-06-17 MED ORDER — GABAPENTIN 300 MG PO CAPS
300.0000 mg | ORAL_CAPSULE | Freq: Three times a day (TID) | ORAL | Status: DC
  Administered 2021-06-17 – 2021-06-19 (×5): 300 mg via ORAL
  Filled 2021-06-17 (×5): qty 1

## 2021-06-17 MED ORDER — SEMAGLUTIDE 7 MG PO TABS: 7.0000 mg | ORAL_TABLET | Freq: Every day | ORAL | Status: DC

## 2021-06-17 MED ORDER — BUPIVACAINE LIPOSOME 1.3 % IJ SUSP
INTRAMUSCULAR | Status: DC | PRN
  Administered 2021-06-17: 10 mL

## 2021-06-17 MED ORDER — THROMBIN 20000 UNITS EX SOLR
CUTANEOUS | Status: AC
  Filled 2021-06-17: qty 20000

## 2021-06-17 MED ORDER — PROPOFOL 1000 MG/100ML IV EMUL
INTRAVENOUS | Status: AC
  Filled 2021-06-17: qty 100

## 2021-06-17 MED ORDER — ONDANSETRON HCL 4 MG PO TABS: 4.0000 mg | ORAL_TABLET | Freq: Four times a day (QID) | ORAL | Status: DC | PRN

## 2021-06-17 MED ORDER — CHLORHEXIDINE GLUCONATE 0.12 % MT SOLN
15.0000 mL | Freq: Once | OROMUCOSAL | Status: AC
  Administered 2021-06-17: 15 mL via OROMUCOSAL
  Filled 2021-06-17: qty 15

## 2021-06-17 MED ORDER — INSULIN ASPART 100 UNIT/ML IJ SOLN
INTRAMUSCULAR | Status: AC
  Administered 2021-06-17: 4 [IU] via SUBCUTANEOUS
  Filled 2021-06-17: qty 1

## 2021-06-17 MED ORDER — SODIUM CHLORIDE 0.9% FLUSH: 3.0000 mL | Freq: Two times a day (BID) | INTRAVENOUS | Status: DC

## 2021-06-17 MED ORDER — SUCCINYLCHOLINE CHLORIDE 200 MG/10ML IV SOSY
PREFILLED_SYRINGE | INTRAVENOUS | Status: DC | PRN
  Administered 2021-06-17: 120 mg via INTRAVENOUS

## 2021-06-17 MED ORDER — MIDAZOLAM HCL 2 MG/2ML IJ SOLN
INTRAMUSCULAR | Status: AC
  Filled 2021-06-17: qty 2

## 2021-06-17 MED ORDER — LIDOCAINE 2% (20 MG/ML) 5 ML SYRINGE
INTRAMUSCULAR | Status: AC
  Filled 2021-06-17: qty 5

## 2021-06-17 MED ORDER — SODIUM CHLORIDE 0.9% FLUSH: 3.0000 mL | INTRAVENOUS | Status: DC | PRN

## 2021-06-17 MED ORDER — POLYETHYLENE GLYCOL 3350 17 G PO PACK
17.0000 g | PACK | Freq: Every day | ORAL | Status: DC | PRN
  Administered 2021-06-18: 17 g via ORAL
  Filled 2021-06-17: qty 1

## 2021-06-17 MED ORDER — METHOCARBAMOL 500 MG PO TABS: 500.0000 mg | ORAL_TABLET | Freq: Three times a day (TID) | ORAL | 0 refills | Status: AC | PRN

## 2021-06-17 MED ORDER — DEXAMETHASONE SODIUM PHOSPHATE 10 MG/ML IJ SOLN
INTRAMUSCULAR | Status: AC
  Filled 2021-06-17: qty 1

## 2021-06-17 MED ORDER — OXYCODONE HCL 5 MG PO TABS
10.0000 mg | ORAL_TABLET | ORAL | Status: DC | PRN
  Administered 2021-06-17 – 2021-06-19 (×7): 10 mg via ORAL
  Filled 2021-06-17 (×7): qty 2

## 2021-06-17 MED ORDER — METFORMIN HCL ER 500 MG PO TB24
500.0000 mg | ORAL_TABLET | Freq: Every day | ORAL | Status: DC
  Administered 2021-06-18 – 2021-06-19 (×2): 500 mg via ORAL
  Filled 2021-06-17 (×2): qty 1

## 2021-06-17 MED ORDER — PROPOFOL 500 MG/50ML IV EMUL
INTRAVENOUS | Status: DC | PRN
  Administered 2021-06-17: 50 ug/kg/min via INTRAVENOUS

## 2021-06-17 MED ORDER — ACETAMINOPHEN 500 MG PO TABS
ORAL_TABLET | ORAL | Status: AC
  Administered 2021-06-17: 1000 mg
  Filled 2021-06-17: qty 1

## 2021-06-17 MED ORDER — PHENOL 1.4 % MT LIQD: 1.0000 | OROMUCOSAL | Status: DC | PRN

## 2021-06-17 MED ORDER — ORAL CARE MOUTH RINSE: 15.0000 mL | Freq: Once | OROMUCOSAL | Status: AC

## 2021-06-17 MED ORDER — INSULIN ASPART 100 UNIT/ML IJ SOLN
4.0000 [IU] | Freq: Once | INTRAMUSCULAR | Status: AC
  Filled 2021-06-17: qty 0.04

## 2021-06-17 MED ORDER — SODIUM CHLORIDE 0.9 % IV SOLN
INTRAVENOUS | Status: DC | PRN
  Administered 2021-06-17: 25 ug/min via INTRAVENOUS

## 2021-06-17 MED ORDER — AMISULPRIDE (ANTIEMETIC) 5 MG/2ML IV SOLN: 10.0000 mg | Freq: Once | INTRAVENOUS | Status: DC | PRN

## 2021-06-17 MED ORDER — CEFAZOLIN SODIUM-DEXTROSE 1-4 GM/50ML-% IV SOLN
1.0000 g | Freq: Three times a day (TID) | INTRAVENOUS | Status: AC
  Administered 2021-06-17 – 2021-06-18 (×2): 1 g via INTRAVENOUS
  Filled 2021-06-17 (×2): qty 50

## 2021-06-17 MED ORDER — FENTANYL CITRATE (PF) 100 MCG/2ML IJ SOLN: 25.0000 ug | INTRAMUSCULAR | Status: DC | PRN

## 2021-06-17 MED ORDER — ACETAMINOPHEN 650 MG RE SUPP: 650.0000 mg | RECTAL | Status: DC | PRN

## 2021-06-17 MED ORDER — ONDANSETRON HCL 4 MG/2ML IJ SOLN
INTRAMUSCULAR | Status: AC
  Filled 2021-06-17: qty 2

## 2021-06-17 MED ORDER — ROCURONIUM BROMIDE 10 MG/ML (PF) SYRINGE
PREFILLED_SYRINGE | INTRAVENOUS | Status: AC
  Filled 2021-06-17: qty 10

## 2021-06-17 MED ORDER — FENTANYL CITRATE (PF) 250 MCG/5ML IJ SOLN
INTRAMUSCULAR | Status: DC | PRN
  Administered 2021-06-17 (×3): 50 ug via INTRAVENOUS

## 2021-06-17 MED ORDER — ALBUMIN HUMAN 5 % IV SOLN: INTRAVENOUS | Status: DC | PRN

## 2021-06-17 MED ORDER — MAGNESIUM CITRATE PO SOLN
1.0000 | Freq: Once | ORAL | Status: AC | PRN
  Administered 2021-06-18: 1 via ORAL
  Filled 2021-06-17: qty 296

## 2021-06-17 MED ORDER — INSULIN ASPART 100 UNIT/ML IJ SOLN
0.0000 [IU] | Freq: Three times a day (TID) | INTRAMUSCULAR | Status: DC
  Administered 2021-06-18: 3 [IU] via SUBCUTANEOUS
  Administered 2021-06-18: 8 [IU] via SUBCUTANEOUS
  Administered 2021-06-18 – 2021-06-19 (×2): 5 [IU] via SUBCUTANEOUS

## 2021-06-17 MED ORDER — ONDANSETRON HCL 4 MG/2ML IJ SOLN
INTRAMUSCULAR | Status: DC | PRN
  Administered 2021-06-17: 4 mg via INTRAVENOUS

## 2021-06-17 MED ORDER — OXYCODONE-ACETAMINOPHEN 10-325 MG PO TABS: 1.0000 | ORAL_TABLET | Freq: Four times a day (QID) | ORAL | 0 refills | Status: AC | PRN

## 2021-06-17 MED ORDER — INSULIN ASPART 100 UNIT/ML IJ SOLN
0.0000 [IU] | Freq: Every day | INTRAMUSCULAR | Status: DC
  Administered 2021-06-17 – 2021-06-18 (×2): 2 [IU] via SUBCUTANEOUS

## 2021-06-17 MED ORDER — 0.9 % SODIUM CHLORIDE (POUR BTL) OPTIME
TOPICAL | Status: DC | PRN
  Administered 2021-06-17: 1000 mL

## 2021-06-17 MED ORDER — MENTHOL 3 MG MT LOZG: 1.0000 | LOZENGE | OROMUCOSAL | Status: DC | PRN

## 2021-06-17 MED ORDER — PROPOFOL 10 MG/ML IV BOLUS
INTRAVENOUS | Status: AC
  Filled 2021-06-17: qty 20

## 2021-06-17 MED ORDER — BUPIVACAINE HCL (PF) 0.5 % IJ SOLN
INTRAMUSCULAR | Status: DC | PRN
  Administered 2021-06-17: 15 mL

## 2021-06-17 MED ORDER — MIDAZOLAM HCL 2 MG/2ML IJ SOLN
INTRAMUSCULAR | Status: AC
  Administered 2021-06-17: 2 mg
  Filled 2021-06-17: qty 2

## 2021-06-17 MED ORDER — BUPIVACAINE-EPINEPHRINE (PF) 0.25% -1:200000 IJ SOLN
INTRAMUSCULAR | Status: AC
  Filled 2021-06-17: qty 30

## 2021-06-17 MED ORDER — BUPIVACAINE-EPINEPHRINE 0.25% -1:200000 IJ SOLN
INTRAMUSCULAR | Status: DC | PRN
  Administered 2021-06-17: 10 mL

## 2021-06-17 MED ORDER — LACTATED RINGERS IV SOLN: INTRAVENOUS | Status: DC | PRN

## 2021-06-17 MED ORDER — LACTATED RINGERS IV SOLN: INTRAVENOUS | Status: DC

## 2021-06-17 MED ORDER — FENTANYL CITRATE (PF) 250 MCG/5ML IJ SOLN
INTRAMUSCULAR | Status: AC
  Filled 2021-06-17: qty 5

## 2021-06-17 MED ORDER — FENTANYL CITRATE (PF) 100 MCG/2ML IJ SOLN
INTRAMUSCULAR | Status: AC
Start: 2021-06-17 — End: 2021-06-17
  Administered 2021-06-17: 100 ug
  Filled 2021-06-17: qty 2

## 2021-06-17 MED ORDER — DOCUSATE SODIUM 100 MG PO CAPS
100.0000 mg | ORAL_CAPSULE | Freq: Two times a day (BID) | ORAL | Status: DC
  Administered 2021-06-17 – 2021-06-19 (×4): 100 mg via ORAL
  Filled 2021-06-17 (×4): qty 1

## 2021-06-17 MED ORDER — BUPIVACAINE LIPOSOME 1.3 % IJ SUSP
INTRAMUSCULAR | Status: AC
  Filled 2021-06-17: qty 20

## 2021-06-17 MED ORDER — METHOCARBAMOL 500 MG PO TABS
500.0000 mg | ORAL_TABLET | Freq: Four times a day (QID) | ORAL | Status: DC | PRN
  Administered 2021-06-17 – 2021-06-19 (×5): 500 mg via ORAL
  Filled 2021-06-17 (×5): qty 1

## 2021-06-17 MED ORDER — PROPOFOL 10 MG/ML IV BOLUS
INTRAVENOUS | Status: DC | PRN
  Administered 2021-06-17: 200 mg via INTRAVENOUS

## 2021-06-17 MED ORDER — SODIUM CHLORIDE 0.9 % IV SOLN: 250.0000 mL | INTRAVENOUS | Status: DC

## 2021-06-17 MED ORDER — LISINOPRIL 10 MG PO TABS
10.0000 mg | ORAL_TABLET | Freq: Every day | ORAL | Status: DC
  Administered 2021-06-17 – 2021-06-19 (×3): 10 mg via ORAL
  Filled 2021-06-17 (×3): qty 1

## 2021-06-17 MED ORDER — ACETAMINOPHEN 325 MG PO TABS
650.0000 mg | ORAL_TABLET | ORAL | Status: DC | PRN
  Administered 2021-06-18 – 2021-06-19 (×4): 650 mg via ORAL
  Filled 2021-06-17 (×4): qty 2

## 2021-06-17 MED ORDER — ROSUVASTATIN CALCIUM 20 MG PO TABS
20.0000 mg | ORAL_TABLET | Freq: Every day | ORAL | Status: DC
  Administered 2021-06-17 – 2021-06-19 (×3): 20 mg via ORAL
  Filled 2021-06-17 (×3): qty 1

## 2021-06-17 MED ORDER — METHOCARBAMOL 1000 MG/10ML IJ SOLN
500.0000 mg | Freq: Four times a day (QID) | INTRAVENOUS | Status: DC | PRN
  Filled 2021-06-17: qty 5

## 2021-06-17 MED ORDER — CEFAZOLIN SODIUM-DEXTROSE 2-4 GM/100ML-% IV SOLN
2.0000 g | INTRAVENOUS | Status: AC
  Administered 2021-06-17: 2 g via INTRAVENOUS

## 2021-06-17 MED ORDER — ONDANSETRON HCL 4 MG/2ML IJ SOLN: 4.0000 mg | Freq: Four times a day (QID) | INTRAMUSCULAR | Status: DC | PRN

## 2021-06-17 MED ORDER — ONDANSETRON HCL 4 MG PO TABS: 4.0000 mg | ORAL_TABLET | Freq: Three times a day (TID) | ORAL | 0 refills | Status: DC | PRN

## 2021-06-17 MED ORDER — OXYCODONE HCL 5 MG PO TABS
5.0000 mg | ORAL_TABLET | ORAL | Status: DC | PRN
  Administered 2021-06-18: 5 mg via ORAL
  Filled 2021-06-17: qty 1

## 2021-06-17 MED ORDER — PROMETHAZINE HCL 25 MG/ML IJ SOLN: 6.2500 mg | INTRAMUSCULAR | Status: DC | PRN

## 2021-06-17 MED ORDER — MORPHINE SULFATE (PF) 2 MG/ML IV SOLN
2.0000 mg | INTRAVENOUS | Status: DC | PRN
  Administered 2021-06-17: 2 mg via INTRAVENOUS
  Filled 2021-06-17: qty 1

## 2021-06-17 SURGICAL SUPPLY — 80 items
APPLIER CLIP 11 MED OPEN (CLIP)
BAG COUNTER SPONGE SURGICOUNT (BAG) ×2 IMPLANT
BLADE CLIPPER SURG (BLADE) IMPLANT
BLADE SURG 10 STRL SS (BLADE) ×2 IMPLANT
BONE MATRIX OSTEOCEL PRO MED (Bone Implant) ×2 IMPLANT
CAGE COROENT XLW TI 12X22X55 (Cage) ×2 IMPLANT
CLIP APPLIE 11 MED OPEN (CLIP) IMPLANT
CLIP PULSE STIMULATION (NEUROSURGERY SUPPLIES) ×2 IMPLANT
CORD BIPOLAR FORCEPS 12FT (ELECTRODE) ×2 IMPLANT
COVER SURGICAL LIGHT HANDLE (MISCELLANEOUS) ×2 IMPLANT
DERMABOND ADVANCED (GAUZE/BANDAGES/DRESSINGS) ×1
DERMABOND ADVANCED .7 DNX12 (GAUZE/BANDAGES/DRESSINGS) ×1 IMPLANT
DRAPE C-ARM 42X72 X-RAY (DRAPES) ×2 IMPLANT
DRAPE ORTHO SPLIT 77X108 STRL (DRAPES) ×1
DRAPE POUCH INSTRU U-SHP 10X18 (DRAPES) ×2 IMPLANT
DRAPE SURG ORHT 6 SPLT 77X108 (DRAPES) ×1 IMPLANT
DRAPE U-SHAPE 47X51 STRL (DRAPES) ×4 IMPLANT
DRSG AQUACEL AG ADV 3.5X 6 (GAUZE/BANDAGES/DRESSINGS) ×2 IMPLANT
DRSG OPSITE POSTOP 4X8 (GAUZE/BANDAGES/DRESSINGS) ×2 IMPLANT
DURAPREP 26ML APPLICATOR (WOUND CARE) ×2 IMPLANT
ELECT BLADE 4.0 EZ CLEAN MEGAD (MISCELLANEOUS) ×2
ELECT CAUTERY BLADE 6.4 (BLADE) ×2 IMPLANT
ELECT PENCIL ROCKER SW 15FT (MISCELLANEOUS) ×2 IMPLANT
ELECT REM PT RETURN 9FT ADLT (ELECTROSURGICAL) ×2
ELECTRODE BLDE 4.0 EZ CLN MEGD (MISCELLANEOUS) ×1 IMPLANT
ELECTRODE REM PT RTRN 9FT ADLT (ELECTROSURGICAL) ×1 IMPLANT
GAUZE 4X4 16PLY ~~LOC~~+RFID DBL (SPONGE) ×2 IMPLANT
GLOVE SURG ENC MOIS LTX SZ6.5 (GLOVE) ×2 IMPLANT
GLOVE SURG MICRO LTX SZ8.5 (GLOVE) ×2 IMPLANT
GLOVE SURG UNDER POLY LF SZ6.5 (GLOVE) ×2 IMPLANT
GLOVE SURG UNDER POLY LF SZ8.5 (GLOVE) ×2 IMPLANT
GOWN STRL REUS W/ TWL LRG LVL3 (GOWN DISPOSABLE) ×1 IMPLANT
GOWN STRL REUS W/TWL 2XL LVL3 (GOWN DISPOSABLE) ×4 IMPLANT
GOWN STRL REUS W/TWL LRG LVL3 (GOWN DISPOSABLE) ×1
GUIDEWIRE NITINOL BEVEL TIP (WIRE) ×8 IMPLANT
KIT BASIN OR (CUSTOM PROCEDURE TRAY) ×2 IMPLANT
KIT DILATOR XLIF 5 (KITS) ×1 IMPLANT
KIT PULSE EMG SURFACE (NEUROSURGERY SUPPLIES) ×2 IMPLANT
KIT PULSE MIOM SURFACE (NEUROSURGERY SUPPLIES) ×2 IMPLANT
KIT SURGICAL ACCESS MAXCESS 4 (KITS) ×2 IMPLANT
KIT TURNOVER KIT B (KITS) ×2 IMPLANT
KIT XLIF (KITS) ×1
NEEDLE 22X1 1/2 (OR ONLY) (NEEDLE) ×2 IMPLANT
NEEDLE I-PASS III (NEEDLE) IMPLANT
NEEDLE SPNL 18GX3.5 QUINCKE PK (NEEDLE) ×2 IMPLANT
NS IRRIG 1000ML POUR BTL (IV SOLUTION) ×2 IMPLANT
PACK LAMINECTOMY ORTHO (CUSTOM PROCEDURE TRAY) ×2 IMPLANT
PACK UNIVERSAL I (CUSTOM PROCEDURE TRAY) ×2 IMPLANT
PAD ARMBOARD 7.5X6 YLW CONV (MISCELLANEOUS) ×4 IMPLANT
PROBE PULSE STIMULATION (NEUROSURGERY SUPPLIES) ×2 IMPLANT
PUTTY DBX 2.5CC (Putty) ×2 IMPLANT
PUTTY DBX 2.5CC DEPUY (Putty) ×1 IMPLANT
ROD RELINE MAS LORD 5.5X45MM (Rod) ×2 IMPLANT
ROD RELINE MAS LORD 5.5X50 (Rod) ×2 IMPLANT
SCREW LOCK RELINE 5.5 TULIP (Screw) ×8 IMPLANT
SCREW RELINE MAS POLY 6.5X40MM (Screw) ×2 IMPLANT
SCREW RELINE RED 6.5X45MM POLY (Screw) ×6 IMPLANT
SPONGE INTESTINAL PEANUT (DISPOSABLE) ×4 IMPLANT
SPONGE SURGIFOAM ABS GEL 100 (HEMOSTASIS) IMPLANT
SPONGE T-LAP 4X18 ~~LOC~~+RFID (SPONGE) ×2 IMPLANT
STRIP CLOSURE SKIN 1/2X4 (GAUZE/BANDAGES/DRESSINGS) ×2 IMPLANT
SURGIFLO W/THROMBIN 8M KIT (HEMOSTASIS) IMPLANT
SUT BONE WAX W31G (SUTURE) ×2 IMPLANT
SUT MNCRL AB 3-0 PS2 27 (SUTURE) ×2 IMPLANT
SUT PROLENE 5 0 C 1 24 (SUTURE) IMPLANT
SUT SILK 2 0 TIES 10X30 (SUTURE) ×2 IMPLANT
SUT SILK 3 0 TIES 10X30 (SUTURE) ×2 IMPLANT
SUT VIC AB 1 CT1 27 (SUTURE) ×2
SUT VIC AB 1 CT1 27XBRD ANBCTR (SUTURE) ×2 IMPLANT
SUT VIC AB 1 CTX 36 (SUTURE) ×2
SUT VIC AB 1 CTX36XBRD ANBCTR (SUTURE) ×2 IMPLANT
SUT VIC AB 2-0 CT1 18 (SUTURE) ×2 IMPLANT
SYR BULB IRRIG 60ML STRL (SYRINGE) ×2 IMPLANT
SYR CONTROL 10ML LL (SYRINGE) ×2 IMPLANT
TAPE CLOTH 4X10 WHT NS (GAUZE/BANDAGES/DRESSINGS) ×4 IMPLANT
TOWEL GREEN STERILE (TOWEL DISPOSABLE) ×2 IMPLANT
TOWEL GREEN STERILE FF (TOWEL DISPOSABLE) ×2 IMPLANT
TRAY FOLEY W/BAG SLVR 16FR (SET/KITS/TRAYS/PACK) ×1
TRAY FOLEY W/BAG SLVR 16FR ST (SET/KITS/TRAYS/PACK) ×1 IMPLANT
WATER STERILE IRR 1000ML POUR (IV SOLUTION) ×2 IMPLANT

## 2021-06-17 NOTE — Op Note (Signed)
OPERATIVE REPORT  DATE OF SURGERY: 06/17/2021  PATIENT NAME:  Billy Nolan MRN: [id number] DOB: Nov 12, 1946  PCP: [name], MD  PRE-OPERATIVE DIAGNOSIS: Postlaminectomy syndrome with recurrent back buttock and neuropathic leg pain L3-4.  Prior L3-4 decompression  POST-OPERATIVE DIAGNOSIS: Same  PROCEDURE:   1.  Lateral interbody fusion L3-4 2.  Posterior lateral instrumentation fusion L3-4  SURGEON:  Melina Schools, MD  PHYSICIAN ASSISTANT: Nelson Chimes, PA  ANESTHESIA:   General  EBL: 628 ml   Complications: None  Implants: NuVasive lateral interbody titanium cage: 12 x 22 x 55, parallel.  NuVasive posterior MIS pedicle screws.  L3: 6.5 x 45 mm length.  Right L4: 6.5 x 40 mm length.  Left L4: 6.5 x 45 mm length  Graft: Ostia cell and DBX  Neuro monitoring: No adverse activity throughout the course of the case with SSEP and free running EMG monitoring.  All 4 pedicle screws were directly stimulated and there was no adverse activity greater than 40 mA.  BRIEF HISTORY: Billy Nolan is a 75 y.o. male who presents to my office with complaints of ongoing back pain and moderate to severe neuropathic leg pain.  Attempts at conservative management had failed to alleviate his symptoms.  Patient had a prior L3-4 decompression in the past and now has recurrent spinal stenosis with degenerative disc disease.  After discussing treatment options we elected to move forward with surgery.  All appropriate risks, benefits, and alternatives to surgery were discussed and consent was obtained  PROCEDURE DETAILS: Patient was brought into the operating room and properly positioned on the operating room table.  Teds and SCDs were applied.  Patient received general anesthesia and then was endotracheally intubated.  A timeout was taken to confirm all important data: Including patient, procedure, and the level.  Foley was placed, and the patient was turned into the lateral decubitus position.   Axillary roll was placed and all bony prominences were well-padded.  Once he was properly positioned on the table he was then secured with tape directly to the table to prevent motion.  Using fluoroscopy I confirmed satisfactory visualization of the L3-4 level in both the AP and lateral plane.  Once confirmed the back was prepped and draped in standard fashion.  Using fluoroscopy I marked out my incision site and infiltrated with quarter percent Marcaine with epinephrine.  A lateral incision was made and I sharply dissected down to the fascia of the external oblique.  A second incision was made 1 fingerbreadth posterior to this incision.  I bluntly dissected down to the posterior aspect of the retroperitoneal fascia and then entered into the retroperitoneal cavity.  I then bluntly dissected and then began palpating on the undersurface of the external oblique.  I then bluntly dissected through the external oblique until I could see my finger in the initial transverse lateral incision.  I then placed the first dilating tube down surface of the psoas muscle.  Once I confirmed I was in the anterior third of the disc space I stimulated the probe to ensure I was not directly compressing the lumbar plexus.  I then advanced through the psoas to the lateral aspect of the disc space.  A locking pin was placed through the trocar in order to prevent patient.  I then stimulated in a circumferential manner confirming satisfactory positioning of the trocar.  I then used dilating tubes until I was at the final tube.  With each placement I did circumferentially stimulated confirming that there is no  trauma to the plexus.  Once the final tube was in place I then placed the retractor to visualize the lateral aspect of the disc space.  The dilating tubes were removed and then I gently repositioned the posterior retraction blade posteriorly keeping a healthy cuff of psoas muscle between the retractor and the lumbar plexus.  Once my  retractor was now positioned in the posterior third of the disc space I stimulated circumferentially and behind the retractors to confirm that I was not traumatizing the plexus.  I then placed the shim through the posterior blade into the disc space.  I now had excellent visualization of the lateral aspect of the disc space of L3-4.   Annulotomy was performed and I remove the bulk of the disc material.  Cobb elevator was then placed along the L4 vertebral body and advanced across the L4 vertebral body to the contralateral side.  I then advanced through the contralateral annulus.  I then repeated this along the L3 vertebral body.  A box osteotome was then used to remove the bulk of the disc material.  Using curettes and Kerrison rongeur and remove the remainder portion of the disc material until it bleeding subchondral bone.  Great care was taken to keep the anterior longitudinal ligament intact.  I then used my trial devices and elected use the size 12 parallel implant.  This provided the best overall fit.  Restoration of normal disc space height causing indirect foraminal and lateral recess decompression.  Titanium cages obtained and packed with the allograft.  I irrigated the wound copiously normal saline and rasped the endplates to ensure I had bleeding subchondral bone.  The implant was then [facility] into position.  Imaging studies demonstrated satisfactory position in both the AP and lateral planes.  I then removed the posterior shim and irrigated the wound copiously normal saline.  I confirmed hemostasis with bipolar electrocautery.  Once the retractor was removed I closed the fascia of the external oblique with interrupted #1 Vicryl sutures.  With the patient maintained in the lateral position identified the lateral aspect of the L3 pedicle and marked out the space posterior made a small incision and advanced my Jamshidi needle to the lateral aspect of the L3 pedicle.  I confirmed satisfactory positioning  and then advanced the Jamshidi needle into the pedicle.  Using both fluoroscopic guidance and direct neural monitoring I advanced the Jamshidi needle down until I was near the medial wall of the pedicle on the AP view.  Once I reached this I switched to the lateral view to confirm that I was beyond the posterior wall of the vertebral body.  Once I confirm satisfactory trajectory I advanced the Jamshidi needle into the vertebral body.  The guidepin was then placed to cannulate the pedicle.  I repeated this exact same technique at L4 and on the contralateral side.  Once all 4 pedicles were cannulated I then measured and then placed the appropriate sized pedicle screw over the guidepin.  All 4 pedicle screws were properly positioned had excellent purchase.  Imaging in both the AP and lateral planes demonstrated satisfactory final position of all 4 pedicle screws.  At this point the bed was returned into the neutral position, and I measured the length of the rod.  I then directly stimulated each of the pedicle screws and there was no adverse activity at greater than 40 mA.  The appropriate size rod was then placed and I confirmed with direct visualization that was satisfactory.  I then placed the locking caps to secure the rods in place.  All 4 locking caps were torqued off according manufacture standards.  With all the instrumentation in place I took my final AP and lateral imaging with fluoroscopy.  Ultimately this was read by the radiologist and cleared of any retained surgical instruments.  All the wounds were copiously irrigated with normal saline and closed in a layered fashion with interrupted #1 Vicryl sutures, 2-0 Vicryl sutures, 3-0 Monocryl for the skin.  Steri-Strips and dry dressings were applied and the patient was ultimately extubated transfer the PACU without incident.  The end of the case all needle and sponge counts were correct.  Melina Schools, MD 06/17/2021 2:19 PM

## 2021-06-17 NOTE — Anesthesia Procedure Notes (Signed)
Anesthesia Regional Block: TAP block   Pre-Anesthetic Checklist: , timeout performed,  Correct Patient, Correct Site, Correct Laterality,  Correct Procedure, Correct Position, site marked,  Risks and benefits discussed,  Surgical consent,  Pre-op evaluation,  At surgeon's request and post-op pain management  Laterality: Left  Prep: chloraprep       Needles:  Injection technique: Single-shot  Needle Type: Echogenic Stimulator Needle     Needle Length: 10cm  Needle Gauge: 21     Additional Needles:   Procedures:,,,, ultrasound used (permanent image in chart),,    Narrative:  Start time: 06/17/2021 10:11 AM End time: 06/17/2021 11:11 AM Injection made incrementally with aspirations every 5 mL.  Performed by: Personally

## 2021-06-17 NOTE — Brief Op Note (Signed)
06/17/2021  2:37 PM  PATIENT:  [name]  75 y.o. male  PRE-OPERATIVE DIAGNOSIS:  Post laminectomy syndrome with degenerative disc disease with radiculopathy L3-4  POST-OPERATIVE DIAGNOSIS:  Post laminectomy syndrome with degenerative disc disease with radiculopathy L3-4  PROCEDURE:  Procedure(s) with comments: ANTERIOR LATERAL LUMBAR FUSION WITH PERCUTANEOUS SCREW 1 LEVEL (XLIF with PSFI) L3-4 (N/A) - 5 hrs Left tap block with exparel 3C bed post op  SURGEON:  Surgeon(s) and Role:    Melina Schools, MD - Primary  PHYSICIAN ASSISTANT:   ASSISTANTS: Nelson Chimes, PA  ANESTHESIA:   general  EBL:  100 mL   BLOOD ADMINISTERED:none  DRAINS: none   LOCAL MEDICATIONS USED:  MARCAINE     SPECIMEN:  No Specimen  DISPOSITION OF SPECIMEN:  N/A  COUNTS:  YES  TOURNIQUET:  * No tourniquets in log *  DICTATION: .Dragon Dictation  PLAN OF CARE: Admit to inpatient   PATIENT DISPOSITION:  PACU - hemodynamically stable.

## 2021-06-17 NOTE — Discharge Instructions (Signed)

## 2021-06-17 NOTE — Anesthesia Procedure Notes (Signed)
Procedure Name: Intubation Date/Time: 06/17/2021 11:38 AM Performed by: Michele Rockers, CRNA Pre-anesthesia Checklist: Patient identified, Patient being monitored, Timeout performed, Emergency Drugs available and Suction available Patient Re-evaluated:Patient Re-evaluated prior to induction Oxygen Delivery Method: Circle system utilized Preoxygenation: Pre-oxygenation with 100% oxygen Induction Type: IV induction Ventilation: Mask ventilation without difficulty Laryngoscope Size: Mac and 3 Grade View: Grade I Tube type: Oral Tube size: 8.0 mm Number of attempts: 1 Airway Equipment and Method: Stylet Placement Confirmation: ETT inserted through vocal cords under direct vision, positive ETCO2 and breath sounds checked- equal and bilateral Secured at: 24 cm Tube secured with: Tape Dental Injury: Teeth and Oropharynx as per pre-operative assessment

## 2021-06-17 NOTE — Transfer of Care (Signed)
Immediate Anesthesia Transfer of Care Note  Patient: Billy Nolan  Procedure(s) Performed: ANTERIOR LATERAL LUMBAR FUSION WITH PERCUTANEOUS SCREW 1 LEVEL (XLIF with PSFI) L3-4  Patient Location: PACU  Anesthesia Type:General  Level of Consciousness: awake, alert  and oriented  Airway & Oxygen Therapy: Patient Spontanous Breathing and Patient connected to face mask oxygen  Post-op Assessment: Report given to RN, Post -op Vital signs reviewed and stable and Patient moving all extremities X 4  Post vital signs: Reviewed and stable  Last Vitals:  Vitals Value Taken Time  BP 149/88 06/17/21 1525  Temp 36.1 C 06/17/21 1525  Pulse 97 06/17/21 1532  Resp 17 06/17/21 1532  SpO2 98 % 06/17/21 1532  Vitals shown include unvalidated device data.  Last Pain:  Vitals:   06/17/21 1121  TempSrc:   PainSc: 0-No pain         Complications: No notable events documented.

## 2021-06-17 NOTE — Anesthesia Postprocedure Evaluation (Signed)
Anesthesia Post Note  Patient: Hussien Greenblatt  Procedure(s) Performed: ANTERIOR LATERAL LUMBAR FUSION WITH PERCUTANEOUS SCREW 1 LEVEL (XLIF with PSFI) L3-4     Patient location during evaluation: PACU Anesthesia Type: General Level of consciousness: sedated Pain management: pain level controlled Vital Signs Assessment: post-procedure vital signs reviewed and stable Respiratory status: spontaneous breathing and respiratory function stable Cardiovascular status: stable Postop Assessment: no apparent nausea or vomiting Anesthetic complications: no   No notable events documented.  Last Vitals:  Vitals:   06/17/21 1610 06/17/21 1625  BP: (!) 147/87 (!) 146/90  Pulse: 96 (!) 103  Resp: (!) 22 19  Temp:  36.5 C  SpO2: 96% 96%    Last Pain:  Vitals:   06/17/21 1655  TempSrc:   PainSc: Asleep    LLE Motor Response: Purposeful movement (06/17/21 1655) LLE Sensation: Full sensation (06/17/21 1655) RLE Motor Response: Purposeful movement (06/17/21 1655) RLE Sensation: Full sensation (06/17/21 1655)      Jahmiya Guidotti DANIEL

## 2021-06-17 NOTE — H&P (Signed)
Addendum H&P: Patient continues to have significant back buttock and neuropathic leg pain right worse than the left.  There is been no change in his clinical exam since his last office visit of 06/05/2021.  He had a prior lumbar decompression at L3-4 and now has recurrent spinal stenosis with degenerative disc disease.  As result of the postlaminectomy syndrome we are planning to move forward with a lateral interbody fusion with supplemental posterior pedicle screw fixation.  I have gone over the risks, benefits, and alternatives to surgery with the patient and he is expressed an understanding of these risks as well as a willingness to move forward with surgery.

## 2021-06-17 NOTE — OR Nursing (Signed)
Dr. Lorriane Shire radiologist called stating no unexpected retained foreign body noted on xray. PA made aware.

## 2021-06-18 LAB — GLUCOSE, CAPILLARY
Glucose-Capillary: 203 mg/dL — ABNORMAL HIGH (ref 70–99)
Glucose-Capillary: 265 mg/dL — ABNORMAL HIGH (ref 70–99)
Glucose-Capillary: 190 mg/dL — ABNORMAL HIGH (ref 70–99)
Glucose-Capillary: 226 mg/dL — ABNORMAL HIGH (ref 70–99)

## 2021-06-18 NOTE — Progress Notes (Signed)
OT Eval and education

## 2021-06-18 NOTE — Progress Notes (Signed)
    Subjective: Procedure(s) (LRB): ANTERIOR LATERAL LUMBAR FUSION WITH PERCUTANEOUS SCREW 1 LEVEL (XLIF with PSFI) L3-4 (N/A) 1 Day Post-Op  Patient reports pain as 2 on 0-10 scale.  Reports decreased leg pain reports incisional back pain   Positive void Negative bowel movement Negative flatus Negative chest pain or shortness of breath  Objective: Vital signs in last 24 hours: Temp:  [97 F (36.1 C)-98.4 F (36.9 C)] 97.9 F (36.6 C) (07/14 1121) Pulse Rate:  [96-110] 107 (07/14 1121) Resp:  [13-23] 18 (07/14 1121) BP: (116-155)/(71-90) 129/71 (07/14 1121) SpO2:  [95 %-100 %] 97 % (07/14 1121)  Intake/Output from previous day: 07/13 0701 - 07/14 0700 In: 2050 [I.V.:1200; IV Piggyback:850] Out: 300 [Urine:200; Blood:100]  Labs: Recent Labs    06/15/21 1400  WBC 8.0  RBC 5.36  HCT 49.2  PLT 256   Recent Labs    06/15/21 1400  NA 135  K 3.8  CL 101  CO2 25  BUN 11  CREATININE 0.91  GLUCOSE 189*  CALCIUM 9.9   Recent Labs    06/15/21 1400  INR 1.0    Physical Exam: Neurologically intact ABD soft Intact pulses distally Incision: dressing C/D/I and no drainage Compartment soft Body mass index is 23.99 kg/m.   Assessment/Plan: Patient stable  xrays n/a Continue mobilization with physical therapy Continue care  Doing very well.  Ambulating and voiding spontaneously. Continue physical therapy. Awaiting flatus and/or bowel movement to ensure that he does not have an ileus. Plan on discharge either later today or in the morning.  Melina Schools, MD [facility] [phone]

## 2021-06-18 NOTE — Evaluation (Signed)
Physical Therapy Evaluation Patient Details Name: Ryley Bachtel MRN: [id number] DOB: 11-15-1946 Today's Date: 06/18/2021   History of Present Illness  75 y.o male s/p lateral interbody fusion and PLIF L3-4. PMH significant for spinal stenosis, DM, hyperlipidemia, and HTN.  Clinical Impression   Pt presents with post-operative soreness and mild unsteadiness of gait, but otherwise pt presenting with baseline level of strength, mobility, and activity tolerance. Pt overall is requiring supervision level of assist, will have family to support him at home. Pt with no further questions, all PT education completed. PT to sign off.      Follow Up Recommendations No PT follow up;Supervision for mobility/OOB    Equipment Recommendations  None recommended by PT    Recommendations for Other Services       Precautions / Restrictions Precautions Precautions: Back Precaution Booklet Issued: Yes (comment) Precaution Comments: Pt verbalizes 3/3 precautions Required Braces or Orthoses: Spinal Brace Spinal Brace: Lumbar corset Restrictions Weight Bearing Restrictions: No      Mobility  Bed Mobility Overal bed mobility: Modified Independent             General bed mobility comments: mod I for increased time to return to supine, verbal cuing    Transfers Overall transfer level: Modified independent Equipment used: None Transfers: Sit to/from Stand Sit to Stand: Supervision         General transfer comment: mod I for increased time to rise/sit  Ambulation/Gait Ambulation/Gait assistance: Supervision Gait Distance (Feet): 350 Feet Assistive device: None Gait Pattern/deviations: Step-through pattern;Decreased stride length;Drifts right/left Gait velocity: slightly decr   General Gait Details: supervision for safety, slight veering L and R with occasional unsteadiness, all pt corrected.  Stairs Stairs: Yes Stairs assistance: Min guard Stair Management: No rails;Step to  pattern;Forwards Number of Stairs: 2 General stair comments: close guard for safety, increased difficulty controlling eccentric lower during descent but no physical asssit required for steadying. PT recommending he has his wife/children supervise him during step navigation especially, pt agreeable  Wheelchair Mobility    Modified Rankin (Stroke Patients Only)       Balance Overall balance assessment: Needs assistance Sitting-balance support: Feet supported;Feet unsupported;No upper extremity supported Sitting balance-Leahy Scale: Good Sitting balance - Comments: no physical assist   Standing balance support: No upper extremity supported;During functional activity Standing balance-Leahy Scale: Fair Standing balance comment: mild unsteadiness during gait                             Pertinent Vitals/Pain Pain Assessment: 0-10 Pain Score: 4  Faces Pain Scale: Hurts little more Pain Location: operative site Pain Descriptors / Indicators: Discomfort;Operative site guarding;Sore Pain Intervention(s): Limited activity within patient's tolerance;Monitored during session;Repositioned    Home Living Family/patient expects to be discharged to:: Private residence Living Arrangements: Spouse/significant other;Children Available Help at Discharge: Family Type of Home: House Home Access: Stairs to enter Entrance Stairs-Rails: None Technical brewer of Steps: 2 Home Layout: One level Home Equipment: Environmental consultant - 2 wheels      Prior Function Level of Independence: Independent               Hand Dominance   Dominant Hand: Right    Extremity/Trunk Assessment   Upper Extremity Assessment Upper Extremity Assessment: Defer to OT evaluation    Lower Extremity Assessment Lower Extremity Assessment: Overall WFL for tasks assessed    Cervical / Trunk Assessment Cervical / Trunk Assessment: Other exceptions Cervical / Trunk Exceptions: s/p  lumbar spinal surgery   Communication   Communication: No difficulties  Cognition Arousal/Alertness: Awake/alert Behavior During Therapy: WFL for tasks assessed/performed Overall Cognitive Status: Within Functional Limits for tasks assessed                                 General Comments: cues to take his time, especially during standing tasks like stairs      General Comments General comments (skin integrity, edema, etc.): Pt reports wife and children available to assist as needed. Pleasant and conversational throughout.    Exercises Other Exercises Other Exercises: home walking program: up and walking during waking hours once an hour with family supervision to decrease post-operative stiffness, promote circulation, maintain strength   Assessment/Plan    PT Assessment Patent does not need any further PT services  PT Problem List         PT Treatment Interventions      PT Goals (Current goals can be found in the Care Plan section)  Acute Rehab PT Goals Patient Stated Goal: go home PT Goal Formulation: With patient Time For Goal Achievement: 06/18/21 Potential to Achieve Goals: Good    Frequency     Barriers to discharge        Co-evaluation               AM-PAC PT "6 Clicks" Mobility  Outcome Measure Help needed turning from your back to your side while in a flat bed without using bedrails?: None Help needed moving from lying on your back to sitting on the side of a flat bed without using bedrails?: None Help needed moving to and from a bed to a chair (including a wheelchair)?: None Help needed standing up from a chair using your arms (e.g., wheelchair or bedside chair)?: None Help needed to walk in hospital room?: A Little Help needed climbing 3-5 steps with a railing? : A Little 6 Click Score: 22    End of Session Equipment Utilized During Treatment: Back brace Activity Tolerance: Patient tolerated treatment well Patient left: in bed;with call bell/phone within  reach Nurse Communication: Mobility status PT Visit Diagnosis: Other abnormalities of gait and mobility (R26.89)    Time: 4818-5631 PT Time Calculation (min) (ACUTE ONLY): 9 min   Charges:   PT Evaluation $PT Eval Low Complexity: 1 Low        Helmi Hechavarria S, PT DPT Acute Rehabilitation Services Pager [phone]  Office [phone]   [place] E Ruffin Pyo 06/18/2021, 10:06 AM

## 2021-06-18 NOTE — Progress Notes (Signed)
Inpatient Diabetes Program Recommendations  AACE/ADA: New Consensus Statement on Inpatient Glycemic Control (2015)  Target Ranges:  Prepandial:   less than 140 mg/dL      Peak postprandial:   less than 180 mg/dL (1-2 hours)      Critically ill patients:  140 - 180 mg/dL   Lab Results  Component Value Date   GLUCAP 265 (H) 06/18/2021   HGBA1C 8.4 (H) 06/15/2021    Review of Glycemic Control Results for ARLEN, DUPUIS (MRN [id number]) as of 06/18/2021 12:15  Ref. Range 06/17/2021 21:01 06/18/2021 05:58 06/18/2021 11:19  Glucose-Capillary Latest Ref Range: 70 - 99 mg/dL 243 (H) 203 (H) 265 (H)   Diabetes history: Type 2 DM Outpatient Diabetes medications: Rybelsus 7 mg QD, Metformin 500 mg BID Current orders for Inpatient glycemic control: Semaglutide 7 mg QD, Novolog 0-15 units TID & HS, Metformin 500 mg BID  Inpatient Diabetes Program Recommendations:   If to remain inpatient, consider adding Levemir 8 units QHS.  Thanks, Bronson Curb, MSN, RNC-OB Diabetes Coordinator [phone] (8a-5p)

## 2021-06-18 NOTE — Evaluation (Addendum)
Occupational Therapy Evaluation/Discharge  Patient Details Name: Billy Nolan MRN: [id number] DOB: Dec 28, 1945 Today's Date: 06/18/2021    History of Present Illness 75 y.o male s/p lateral interbody fusion and PLIF L3-4. PMH significant for spinal stenosis, DM, hyperlipidemia, and HTN.   Clinical Impression   PTA, pt was independent and loved with his wife and kids. Currently, pt performing all UB ADLs with mod I and LB ADLs with supervision for safety. Pt educated and demonstrating compensatory techniques for LB dressing, brace application, shower transfers, oral care, and toileting. Requiring min cues for adhering to back precautions when reaching for clothing. All questions answered. Recommend discharge home with no follow up OT. Re-consult if change in status. OT to sign off.     Follow Up Recommendations  No OT follow up    Equipment Recommendations  None recommended by OT    Recommendations for Other Services       Precautions / Restrictions Precautions Precautions: Back Precaution Booklet Issued: Yes (comment) Precaution Comments: All education reviewed, pt demonstrating and verbalizing understanding Required Braces or Orthoses: Spinal Brace Spinal Brace: Lumbar corset Restrictions Weight Bearing Restrictions: No      Mobility Bed Mobility Overal bed mobility: Modified Independent             General bed mobility comments: Mod I for increased time.    Transfers Overall transfer level: Needs assistance Equipment used: None Transfers: Sit to/from Stand Sit to Stand: Supervision         General transfer comment: Supervision due to reports of dizziness upon standing.    Balance Overall balance assessment: Needs assistance Sitting-balance support: Feet supported;Feet unsupported;No upper extremity supported Sitting balance-Leahy Scale: Good Sitting balance - Comments: Performing LB dressing sitting EOB.   Standing balance support: No upper extremity  supported;During functional activity Standing balance-Leahy Scale: Good Standing balance comment: Performing walk-in shower transfer with supervision for safety.                           ADL either performed or assessed with clinical judgement   ADL Overall ADL's : Needs assistance/impaired                                       General ADL Comments: Pt requiring supervision for LB ADLs and functional mobility due to report of dizziness this session. Pt educated and demonstrating compensatory techniques for LB dressing, brace application, shower transfer, toileting, and oral care. Educated on AE for donning socks. Pt performing with supervision, cues for adhering to back precautions during dressing.     Vision   Vision Assessment?: No apparent visual deficits     Perception Perception Perception Tested?: No Comments: no apparent difficulty with perception.   Praxis Praxis Praxis tested?: Not tested Praxis-Other Comments: No apparent difficulty with motor planning.    Pertinent Vitals/Pain Pain Assessment: Faces Faces Pain Scale: Hurts little more Pain Location: operative site Pain Descriptors / Indicators: Discomfort;Operative site guarding Pain Intervention(s): Limited activity within patient's tolerance;Monitored during session     Hand Dominance     Extremity/Trunk Assessment Upper Extremity Assessment Upper Extremity Assessment: Overall WFL for tasks assessed   Lower Extremity Assessment Lower Extremity Assessment: Defer to PT evaluation   Cervical / Trunk Assessment Cervical / Trunk Assessment: Other exceptions Cervical / Trunk Exceptions: s/p lumbar spinal surgery   Communication Communication Communication: No difficulties  Cognition Arousal/Alertness: Awake/alert Behavior During Therapy: WFL for tasks assessed/performed Overall Cognitive Status: Within Functional Limits for tasks assessed                                  General Comments: Pt requiring min cues to adhere to back precautions during ADL   General Comments  Pt reports wife and children available to assist as needed. Pleasant and conversational throughout.    Exercises     Shoulder Instructions      Home Living Family/patient expects to be discharged to:: Private residence Living Arrangements: Spouse/significant other;Children Available Help at Discharge: Family Type of Home: House Home Access: Stairs to enter Technical brewer of Steps: 2 Entrance Stairs-Rails: None Home Layout: One level     Bathroom Shower/Tub: Walk-in shower         Home Equipment: Environmental consultant - 2 wheels          Prior Functioning/Environment Level of Independence: Independent                 OT Problem List: Decreased strength;Impaired balance (sitting and/or standing);Decreased knowledge of precautions;Pain      OT Treatment/Interventions:      OT Goals(Current goals can be found in the care plan section) Acute Rehab OT Goals Patient Stated Goal: go home OT Goal Formulation: With patient  OT Frequency:     Barriers to D/C:            Co-evaluation              AM-PAC OT "6 Clicks" Daily Activity     Outcome Measure Help from another person eating meals?: None Help from another person taking care of personal grooming?: None Help from another person toileting, which includes using toliet, bedpan, or urinal?: A Little Help from another person bathing (including washing, rinsing, drying)?: A Little Help from another person to put on and taking off regular upper body clothing?: A Little Help from another person to put on and taking off regular lower body clothing?: A Little 6 Click Score: 20   End of Session Equipment Utilized During Treatment: Gait belt Nurse Communication: Mobility status  Activity Tolerance: Patient tolerated treatment well Patient left: in bed;with call bell/phone within reach  OT Visit Diagnosis:  Unsteadiness on feet (R26.81);Muscle weakness (generalized) (M62.81);Pain Pain - part of body:  (back)                Time: 1607-3710 OT Time Calculation (min): 20 min Charges:  OT General Charges $OT Visit: 1 Visit OT Evaluation $OT Eval Moderate Complexity: 1 [address] OTDS   Shanda Howells 06/18/2021, 8:44 AM

## 2021-06-19 ENCOUNTER — Encounter (HOSPITAL_COMMUNITY): Payer: Self-pay | Admitting: Orthopedic Surgery

## 2021-06-19 LAB — GLUCOSE, CAPILLARY: Glucose-Capillary: 209 mg/dL — ABNORMAL HIGH (ref 70–99)

## 2021-06-19 NOTE — Progress Notes (Signed)
    Subjective: Procedure(s) (LRB): ANTERIOR LATERAL LUMBAR FUSION WITH PERCUTANEOUS SCREW 1 LEVEL (XLIF with PSFI) L3-4 (N/A) 2 Days Post-Op  Patient reports pain as 2 on 0-10 scale.  Reports decreased leg pain reports incisional back pain   Positive void Positive bowel movement Positive flatus Negative chest pain or shortness of breath  Objective: Vital signs in last 24 hours: Temp:  [97.9 F (36.6 C)-98.2 F (36.8 C)] 98 F (36.7 C) (07/15 0750) Pulse Rate:  [94-110] 100 (07/15 0750) Resp:  [18-20] 19 (07/15 0750) BP: (107-129)/(60-81) 127/81 (07/15 0750) SpO2:  [95 %-98 %] 97 % (07/15 0750)  Intake/Output from previous day: No intake/output data recorded.  Labs: No results for input(s): WBC, RBC, HCT, PLT in the last 72 hours. No results for input(s): NA, K, CL, CO2, BUN, CREATININE, GLUCOSE, CALCIUM in the last 72 hours. No results for input(s): LABPT, INR in the last 72 hours.  Physical Exam: Neurologically intact ABD soft Sensation intact distally Dorsiflexion/Plantar flexion intact Incision: dressing C/D/I and no drainage Compartment soft Body mass index is 23.99 kg/m.   Assessment/Plan: Patient stable  xrays n/a Continue mobilization with physical therapy Continue care  Advance diet Up with therapy Patient doing exceptionally well.  Cleared from PT.  Ambulating with no significant difficulty.  Plan on discharge to home.  Instructions were provided as well as his follow-up.  Melina Schools, MD [facility] [phone]

## 2021-06-19 NOTE — Progress Notes (Signed)
Patient alert and oriented, voiding adequately, MAE well with no difficulty. Incision area cdi with no s/s of infection. Patient discharged home per order. Patient and Spouse stated understanding of discharge instructions given. Patient has an appointment with Dr. Rolena Infante

## 2021-06-23 NOTE — Discharge Summary (Signed)
Patient ID: Billy Nolan MRN: [id number] DOB/AGE: December 22, 1945 75 y.o.  Admit date: 06/17/2021 Discharge date: 06/23/2021  Admission Diagnoses:  Active Problems:   Fusion of lumbar spine   Discharge Diagnoses:  Active Problems:   Fusion of lumbar spine  status post Procedure(s): ANTERIOR LATERAL LUMBAR FUSION WITH PERCUTANEOUS SCREW 1 LEVEL (XLIF with PSFI) L3-4  Past Medical History:  Diagnosis Date   Allergy    Diabetes mellitus without complication (HCC)    GERD (gastroesophageal reflux disease)    Hyperlipidemia    Hypertension     Surgeries: Procedure(s): ANTERIOR LATERAL LUMBAR FUSION WITH PERCUTANEOUS SCREW 1 LEVEL (XLIF with PSFI) L3-4 on 06/17/2021   Consultants:   Discharged Condition: Improved  Hospital Course: Billy Nolan is an 75 y.o. male who was admitted 06/17/2021 for operative treatment of Post laminectomy syndrome with degenerative disc disease with radiculopathy L3-4. Patient failed conservative treatments (please see the history and physical for the specifics) and had severe unremitting pain that affects sleep, daily activities and work/hobbies. After pre-op clearance, the patient was taken to the operating room on 06/17/2021 and underwent  Procedure(s): ANTERIOR LATERAL LUMBAR FUSION WITH PERCUTANEOUS SCREW 1 LEVEL (XLIF with PSFI) L3-4.    Patient was given perioperative antibiotics:  Anti-infectives (From admission, onward)    Start     Dose/Rate Route Frequency Ordered Stop   06/17/21 1930  ceFAZolin (ANCEF) IVPB 1 g/50 mL premix        1 g 100 mL/hr over 30 Minutes Intravenous Every 8 hours 06/17/21 1739 06/18/21 0319   06/17/21 1013  ceFAZolin (ANCEF) IVPB 2g/100 mL premix        2 g 200 mL/hr over 30 Minutes Intravenous 30 min pre-op 06/17/21 1013 06/17/21 1130        Patient was given sequential compression devices and early ambulation to prevent DVT.   Patient benefited maximally from hospital stay and there were no complications. At the  time of discharge, the patient was urinating/moving their bowels without difficulty, tolerating a regular diet, pain is controlled with oral pain medications and they have been cleared by PT/OT.   Recent vital signs: No data found.   Recent laboratory studies: No results for input(s): WBC, HGB, HCT, PLT, NA, K, CL, CO2, BUN, CREATININE, GLUCOSE, INR, CALCIUM in the last 72 hours.  Invalid input(s): PT, 2   Discharge Medications:   Allergies as of 06/19/2021       Reactions   Sulfa Antibiotics Rash        Medication List     STOP taking these medications    gabapentin 300 MG capsule Commonly known as: NEURONTIN   sildenafil 20 MG tablet Commonly known as: REVATIO       TAKE these medications    FreeStyle Libre 2 Sensor Misc USE TO CHECK BLOOD SUGAR 6 TIMES DAILY. Dx: E11.9   lisinopril 10 MG tablet Commonly known as: ZESTRIL Take 1 tablet (10 mg total) by mouth daily.   metFORMIN 500 MG 24 hr tablet Commonly known as: GLUCOPHAGE-XR Take 1 tablet (500 mg total) by mouth daily with breakfast.   ondansetron 4 MG tablet Commonly known as: Zofran Take 1 tablet (4 mg total) by mouth every 8 (eight) hours as needed for nausea or vomiting.   rosuvastatin 20 MG tablet Commonly known as: CRESTOR Take 1 tablet (20 mg total) by mouth daily.   Rybelsus 7 MG Tabs Generic drug: Semaglutide Take 7 mg by mouth daily.       ASK  your doctor about these medications    famotidine 20 MG tablet Commonly known as: PEPCID Take 1 tablet (20 mg total) by mouth daily. For reflux / heartburn   methocarbamol 500 MG tablet Commonly known as: Robaxin Take 1 tablet (500 mg total) by mouth every 8 (eight) hours as needed for up to 5 days for muscle spasms. Ask about: Should I take this medication?   oxyCODONE-acetaminophen 10-325 MG tablet Commonly known as: Percocet Take 1 tablet by mouth every 6 (six) hours as needed for up to 5 days for pain. Ask about: Should I take this  medication?        Diagnostic Studies: DG Chest 2 View  Result Date: 06/16/2021 CLINICAL DATA:  Preop lumbar spine surgery EXAM: CHEST - 2 VIEW COMPARISON:  01/27/2016 FINDINGS: Midline trachea. Normal heart size and mediastinal contours. No pleural effusion or pneumothorax. Mild volume loss at the right lung base with medial right lower lobe atelectasis. Curvilinear calcifications project over the right upper abdomen. IMPRESSION: No acute cardiopulmonary disease. Suspicion of cholelithiasis. Electronically Signed   By: Abigail Miyamoto M.D.   On: 06/16/2021 14:46   DG Lumbar Spine 2-3 Views  Result Date: 06/17/2021 CLINICAL DATA:  Anterior lumbar spinal fusion with percutaneous screw placement at L3-4. EXAM: LUMBAR SPINE - 2-3 VIEW COMPARISON:  Intraoperative radiograph from February 2017. FINDINGS: [place] in view of the lumbar spine reportedly at the L3-4 level showing posterior rod and pedicle screw placement and interbody cage fixation at what is designated as the L3-4 level. Two total images are submitted in AP and lateral projection. No immediate complication is noted. No unexpected radiopaque foreign bodies within the small field of view. There is a staple projecting posterior to the L5 level that may be external to the patient and is not seen on the AP projection. IMPRESSION: 1. No unexpected radiopaque foreign bodies within the small field of view. 2. Posterior rod and pedicle screw fixation at what is designated as the L3-4 level. 3. Staple seen on the lateral projection is reportedly external to the patient in towels and is not seen on the AP projection. These results were called by telephone at the time of interpretation on 06/17/2021 at 2:18 pm to provider OR staff, who verbally acknowledged these results. Electronically Signed   By: Zetta Bills M.D.   On: 06/17/2021 14:19   DG C-Arm 1-60 Min  Result Date: 06/17/2021 CLINICAL DATA:  Post lumbar spinal fusion. EXAM: DG C-ARM 1-60 MIN  CONTRAST:  Not applicable FLUOROSCOPY TIME:  Fluoroscopy Time:  4 minutes 4 seconds Radiation Exposure Index (if provided by the fluoroscopic device): 94.73 mGy Number of Acquired Spot Images: 0 COMPARISON:  Images should be linked with a second a session number of the lumbar spine for postoperative assessment and are compared with preoperative images from 2017. FINDINGS: Lumbar spine images were assessed at 1420 hours with communication with the OR staff. No unexpected radiopaque foreign bodies or immediate complications were noted at the time of interpretation. Radiation dose profile as outlined above. There are 2 submitted coned in views of the lumbar spine also discussed in the initial report. IMPRESSION: Limited views lumbar spine without immediate complication or sign of unexpected radiopaque foreign body as discussed with OR staff the time of dictation of these images under a separate a section. Radiation dose profile for this exam as outlined above. Electronically Signed   By: Zetta Bills M.D.   On: 06/17/2021 16:44    Discharge Instructions  Incentive spirometry RT   Complete by: As directed         Follow-up Information     Melina Schools, MD Follow up in 2 week(s).   Specialty: Orthopedic Surgery Why: If symptoms worsen, For suture removal, For wound re-check Contact information: [address] [place] [address] [phone]                 Discharge Plan:  discharge to home  Disposition: stable    Signed: Charlyne Petrin for [facility] PA-C [facility] [phone] 06/23/2021, 4:04 PM

## 2021-07-20 ENCOUNTER — Ambulatory Visit: Payer: PPO | Admitting: Family Medicine

## 2021-07-27 DIAGNOSIS — Z4889 Encounter for other specified surgical aftercare: Secondary | ICD-10-CM | POA: Diagnosis not present

## 2021-07-27 DIAGNOSIS — Z981 Arthrodesis status: Secondary | ICD-10-CM | POA: Diagnosis not present

## 2021-08-26 ENCOUNTER — Other Ambulatory Visit: Payer: Self-pay

## 2021-08-26 ENCOUNTER — Ambulatory Visit: Payer: PPO | Admitting: Physical Therapy

## 2021-08-26 NOTE — Therapy (Signed)
[facility] [place], [place], [address] Phone: [phone]   Fax:  [phone]  Patient Details  Name: Billy Nolan MRN: [id number] Date of Birth: 1946/09/11 Referring Provider:  Earvin Hansen  Encounter Date: 08/26/2021  Patient feels no need to start PT.  He had an excellent surgical outcome and states its the best he has felt in years.  Will call clinic should he change his mind.   Karuna Balducci, [place] MPT 08/26/2021, 9:59 AM  [facility] [address] [place], [place], [address] Phone: [phone]   Fax:  [phone]

## 2021-09-03 ENCOUNTER — Other Ambulatory Visit: Payer: Self-pay

## 2021-09-03 ENCOUNTER — Ambulatory Visit (INDEPENDENT_AMBULATORY_CARE_PROVIDER_SITE_OTHER): Payer: PPO | Admitting: Family Medicine

## 2021-09-03 ENCOUNTER — Encounter: Payer: Self-pay | Admitting: Family Medicine

## 2021-09-03 VITALS — BP 131/84 | HR 89 | Temp 97.6°F | Ht 68.0 in

## 2021-09-03 DIAGNOSIS — R7989 Other specified abnormal findings of blood chemistry: Secondary | ICD-10-CM

## 2021-09-03 DIAGNOSIS — E785 Hyperlipidemia, unspecified: Secondary | ICD-10-CM | POA: Diagnosis not present

## 2021-09-03 DIAGNOSIS — E119 Type 2 diabetes mellitus without complications: Secondary | ICD-10-CM | POA: Diagnosis not present

## 2021-09-03 DIAGNOSIS — E1141 Type 2 diabetes mellitus with diabetic mononeuropathy: Secondary | ICD-10-CM

## 2021-09-03 LAB — BAYER DCA HB A1C WAIVED: HB A1C (BAYER DCA - WAIVED): 8 % — ABNORMAL HIGH (ref 4.8–5.6)

## 2021-09-03 MED ORDER — RYBELSUS 7 MG PO TABS: 7.0000 mg | ORAL_TABLET | Freq: Every day | ORAL | 3 refills | Status: DC

## 2021-09-03 MED ORDER — METFORMIN HCL ER 500 MG PO TB24: 500.0000 mg | ORAL_TABLET | Freq: Every day | ORAL | 3 refills | Status: DC

## 2021-09-03 MED ORDER — LISINOPRIL 10 MG PO TABS: 10.0000 mg | ORAL_TABLET | Freq: Every day | ORAL | 3 refills | Status: DC

## 2021-09-03 MED ORDER — CANAGLIFLOZIN 300 MG PO TABS: 300.0000 mg | ORAL_TABLET | Freq: Every day | ORAL | 5 refills | Status: DC

## 2021-09-03 NOTE — Progress Notes (Signed)
Subjective:  Patient ID: Billy Nolan,  male    DOB: 1946/06/07  Age: 75 y.o.    CC: Medical Management of Chronic Issues   HPI Billy Nolan presents for  follow-up of hypertension. Patient has no history of headache chest pain or shortness of breath or recent cough. Patient also denies symptoms of TIA such as numbness weakness lateralizing. Patient denies side effects from medication. States taking it regularly.  Patient also  in for follow-up of elevated cholesterol. Doing well without complaints on current medication. Denies side effects  including myalgia and arthralgia and nausea. Also in today for liver function testing. Currently no chest pain, shortness of breath or other cardiovascular related symptoms noted.  Follow-up of diabetes. Patient does check blood sugar at home. Readings run 210+ random. Goes to 350 a lot.  Patient denies symptoms such as excessive hunger or urinary frequency, excessive hunger, nausea No significant hypoglycemic spells noted. Medications reviewed. Pt reports taking them regularly. Pt. denies complication/adverse reaction today. Could't tolerate the 10 mg rybelsus due to lethargy, nausea and vomiting.    follow-up on  thyroid. The patient has a history of hypothyroidism for many years. It has been stable recently. Pt. denies any change in  voice, loss of hair, heat or cold intolerance. Energy level has been adequate to good. Patient denies constipation and diarrhea. No myxedema. Medication is as noted below. Verified that pt is taking it daily on an empty stomach. Well tolerated.    History Billy Nolan has a past medical history of Allergy, Diabetes mellitus without complication (Billy Nolan), GERD (gastroesophageal reflux disease), Hyperlipidemia, and Hypertension.   He has a past surgical history that includes dental surgeries; Lumbar laminectomy/decompression microdiscectomy (Left, 01/30/2016); and Anterior lateral lumbar fusion with percutaneous screw 1 level (N/A,  06/17/2021).   His family history includes Cancer in his sister; Heart disease in his father and mother; Multiple sclerosis in his sister; Stroke in his father; Ulcerative colitis in his son.He reports that he has never smoked. He has never used smokeless tobacco. He reports that he does not drink alcohol and does not use drugs.  Current Outpatient Medications on File Prior to Visit  Medication Sig Dispense Refill   Continuous Blood Gluc Sensor (FREESTYLE LIBRE 2 SENSOR) MISC USE TO CHECK BLOOD SUGAR 6 TIMES DAILY. Dx: E11.9 2 each 11   famotidine (PEPCID) 20 MG tablet Take 1 tablet (20 mg total) by mouth daily. For reflux / heartburn (Patient taking differently: Take 20 mg by mouth daily as needed for heartburn or indigestion.) 90 tablet 3   rosuvastatin (CRESTOR) 20 MG tablet Take 1 tablet (20 mg total) by mouth daily. 90 tablet 3   No current facility-administered medications on file prior to visit.    ROS Review of Systems  Constitutional:  Negative for fever.  Respiratory:  Negative for shortness of breath.   Cardiovascular:  Negative for chest pain.  Musculoskeletal:  Negative for arthralgias.  Skin:  Negative for rash.   Objective:  BP 131/84   Pulse 89   Temp 97.6 F (36.4 C)   Ht _0  (1.727 m)   SpO2 98%   BMI 23.99 kg/m   BP Readings from Last 3 Encounters:  09/03/21 131/84  06/19/21 127/81  06/15/21 (!) 143/87    Wt Readings from Last 3 Encounters:  06/17/21 157 lb 12.8 oz (71.6 kg)  06/15/21 162 lb 9.6 oz (73.8 kg)  06/02/21 159 lb 9.6 oz (72.4 kg)     Physical Exam Vitals  reviewed.  Constitutional:      Appearance: He is well-developed.  HENT:     Head: Normocephalic and atraumatic.     Right Ear: External ear normal.     Left Ear: External ear normal.     Mouth/Throat:     Pharynx: No oropharyngeal exudate or posterior oropharyngeal erythema.  Eyes:     Pupils: Pupils are equal, round, and reactive to light.  Cardiovascular:     Rate and Rhythm:  Normal rate and regular rhythm.     Heart sounds: No murmur heard. Pulmonary:     Effort: No respiratory distress.     Breath sounds: Normal breath sounds.  Musculoskeletal:     Cervical back: Normal range of motion and neck supple.  Neurological:     Mental Status: He is alert and oriented to person, place, and time.    Diabetic Foot Exam - Simple   No data filed       Assessment & Plan:   Billy Nolan was seen today for medical management of chronic issues.  Diagnoses and all orders for this visit:  Type 2 diabetes mellitus with diabetic mononeuropathy, without long-term current use of insulin (HCC) -     Bayer DCA Hb A1c Waived -     CMP14+EGFR -     CBC with Differential/Platelet -     lisinopril (ZESTRIL) 10 MG tablet; Take 1 tablet (10 mg total) by mouth daily. -     metFORMIN (GLUCOPHAGE-XR) 500 MG 24 hr tablet; Take 1 tablet (500 mg total) by mouth daily with breakfast.  Type 2 diabetes mellitus without complication, without long-term current use of insulin (HCC)  Hyperlipidemia, unspecified hyperlipidemia type -     Lipid panel  Abnormal TSH -     TSH + free T4  Other orders -     Semaglutide (RYBELSUS) 7 MG TABS; Take 7 mg by mouth daily. -     canagliflozin (INVOKANA) 300 MG TABS tablet; Take 1 tablet (300 mg total) by mouth daily before breakfast. For diabetes  I have discontinued Billy Nolan's ondansetron. I am also having him start on canagliflozin. Additionally, I am having him maintain his FreeStyle Libre 2 Sensor, rosuvastatin, famotidine, lisinopril, metFORMIN, and Rybelsus.  Meds ordered this encounter  Medications   lisinopril (ZESTRIL) 10 MG tablet    Sig: Take 1 tablet (10 mg total) by mouth daily.    Dispense:  90 tablet    Refill:  3   metFORMIN (GLUCOPHAGE-XR) 500 MG 24 hr tablet    Sig: Take 1 tablet (500 mg total) by mouth daily with breakfast.    Dispense:  90 tablet    Refill:  3   Semaglutide (RYBELSUS) 7 MG TABS    Sig: Take 7 mg by  mouth daily.    Dispense:  90 tablet    Refill:  3   canagliflozin (INVOKANA) 300 MG TABS tablet    Sig: Take 1 tablet (300 mg total) by mouth daily before breakfast. For diabetes    Dispense:  30 tablet    Refill:  5     Follow-up: Return in about 3 months (around 12/03/2021).  [name], M.D.

## 2021-09-04 LAB — LIPID PANEL
Chol/HDL Ratio: 3.7 ratio (ref 0.0–5.0)
Cholesterol, Total: 130 mg/dL (ref 100–199)
HDL: 35 mg/dL — ABNORMAL LOW (ref >39)
LDL Chol Calc (NIH): 48 mg/dL (ref 0–99)
Triglycerides: 307 mg/dL — ABNORMAL HIGH (ref 0–149)
VLDL Cholesterol Cal: 47 mg/dL — ABNORMAL HIGH (ref 5–40)

## 2021-09-04 LAB — CBC WITH DIFFERENTIAL/PLATELET
Basophils Absolute: 0 10*3/uL (ref 0.0–0.2)
Basos: 1 %
EOS (ABSOLUTE): 0.1 10*3/uL (ref 0.0–0.4)
Eos: 2 %
Hematocrit: 45.8 % (ref 37.5–51.0)
Hemoglobin: 16.2 g/dL (ref 13.0–17.7)
Immature Grans (Abs): 0 10*3/uL (ref 0.0–0.1)
Immature Granulocytes: 0 %
Lymphocytes Absolute: 2.5 10*3/uL (ref 0.7–3.1)
Lymphs: 39 %
MCH: 31.5 pg (ref 26.6–33.0)
MCHC: 35.4 g/dL (ref 31.5–35.7)
MCV: 89 fL (ref 79–97)
Monocytes Absolute: 0.4 10*3/uL (ref 0.1–0.9)
Monocytes: 7 %
Neutrophils Absolute: 3.3 10*3/uL (ref 1.4–7.0)
Neutrophils: 51 %
Platelets: 233 10*3/uL (ref 150–450)
RBC: 5.14 x10E6/uL (ref 4.14–5.80)
RDW: 13 % (ref 11.6–15.4)
WBC: 6.4 10*3/uL (ref 3.4–10.8)

## 2021-09-04 LAB — CMP14+EGFR
ALT: 16 IU/L (ref 0–44)
AST: 16 IU/L (ref 0–40)
Albumin/Globulin Ratio: 2.2 (ref 1.2–2.2)
Albumin: 4.7 g/dL (ref 3.7–4.7)
Alkaline Phosphatase: 73 IU/L (ref 44–121)
BUN/Creatinine Ratio: 13 (ref 10–24)
BUN: 11 mg/dL (ref 8–27)
Bilirubin Total: 1.5 mg/dL — ABNORMAL HIGH (ref 0.0–1.2)
CO2: 20 mmol/L (ref 20–29)
Calcium: 9.9 mg/dL (ref 8.6–10.2)
Chloride: 100 mmol/L (ref 96–106)
Creatinine, Ser: 0.84 mg/dL (ref 0.76–1.27)
Globulin, Total: 2.1 g/dL (ref 1.5–4.5)
Glucose: 130 mg/dL — ABNORMAL HIGH (ref 70–99)
Potassium: 4.9 mmol/L (ref 3.5–5.2)
Sodium: 140 mmol/L (ref 134–144)
Total Protein: 6.8 g/dL (ref 6.0–8.5)
eGFR: 92 mL/min/{1.73_m2} (ref >59)

## 2021-09-04 LAB — TSH+FREE T4
Free T4: 1.23 ng/dL (ref 0.82–1.77)
TSH: 3.85 u[IU]/mL (ref 0.450–4.500)

## 2021-09-04 NOTE — Progress Notes (Signed)
Hello Billy Nolan,  Your lab result is normal and/or stable.Some minor variations that are not significant are commonly marked abnormal, but do not represent any medical problem for you.  Best regards, Carlosdaniel Grob, M.D.

## 2021-09-07 DIAGNOSIS — Z4889 Encounter for other specified surgical aftercare: Secondary | ICD-10-CM | POA: Diagnosis not present

## 2021-09-16 ENCOUNTER — Other Ambulatory Visit: Payer: Self-pay | Admitting: Family Medicine

## 2021-10-06 DIAGNOSIS — Z1283 Encounter for screening for malignant neoplasm of skin: Secondary | ICD-10-CM | POA: Diagnosis not present

## 2021-10-06 DIAGNOSIS — D239 Other benign neoplasm of skin, unspecified: Secondary | ICD-10-CM | POA: Diagnosis not present

## 2021-10-06 DIAGNOSIS — D1801 Hemangioma of skin and subcutaneous tissue: Secondary | ICD-10-CM | POA: Diagnosis not present

## 2021-11-16 LAB — HM DIABETES EYE EXAM

## 2021-11-20 ENCOUNTER — Ambulatory Visit (INDEPENDENT_AMBULATORY_CARE_PROVIDER_SITE_OTHER): Payer: PPO | Admitting: Pharmacist

## 2021-11-20 DIAGNOSIS — E785 Hyperlipidemia, unspecified: Secondary | ICD-10-CM

## 2021-11-20 DIAGNOSIS — E1141 Type 2 diabetes mellitus with diabetic mononeuropathy: Secondary | ICD-10-CM

## 2021-11-25 NOTE — Progress Notes (Signed)
Chronic Care Management Pharmacy Note  11/20/2021 Name:  Billy Nolan MRN:  [id number] DOB:  05/26/1946  Summary: T2DM, CKD  Recommendations/Changes made from today's visit:   Diabetes: New goal. Uncontrolled (A1C 8%); current treatment:RYBELSUS 7MG, METFORMIN, NEW INVOKANA;  SWITCH RYBELSUS TO OZEMPIC 0.5MG--APPLY [place] Denies personal and family history of Medullary thyroid cancer ([place]) CONTINUE METFORMIN SWITCH INVOKANA TO [place] CKD, CV & T2DM DATA WILL APPLY FOR AZ&ME PAP Denies hypoglycemic/hyperglycemic symptoms Discussed meal planning options and Plate method for healthy eating Avoid sugary drinks and desserts Incorporate balanced protein, non starchy veggies, 1 serving of carbohydrate with each meal Increase water intake Increase physical activity as able DISCUSSED blood pressure medications, at goal Current exercise: n/a Collaborated with PCP to change SGLT2 TO [place] patient finances. WILL ENROLL IN [name] FOR [place] AND AZ&ME FOR [place]   Patient Goals/Self-Care Activities patient will:  - take medications as prescribed as evidenced by patient report and record review check glucose DAILY OR IF SYMPTOMATIC, document, and provide at future appointments collaborate with provider on medication access solutions   Plan:  Subjective: Billy Nolan is an 75 y.o. year old male who is a primary patient of Stacks, Cletus Gash, MD.  The CCM team was consulted for assistance with disease management and care coordination needs.    Engaged with patient face to face for initial visit in response to provider referral for pharmacy case management and/or care coordination services.   Consent to Services:  The patient was given the following information about Chronic Care Management services today, agreed to services, and gave verbal consent: 1. CCM service includes personalized support from designated clinical staff  supervised by the primary care provider, including individualized plan of care and coordination with other care providers 2. 24/7 contact phone numbers for assistance for urgent and routine care needs. 3. Service will only be billed when office clinical staff spend 20 minutes or more in a month to coordinate care. 4. Only one practitioner may furnish and bill the service in a calendar month. 5.The patient may stop CCM services at any time (effective at the end of the month) by phone call to the office staff. 6. The patient will be responsible for cost sharing (co-pay) of up to 20% of the service fee (after annual deductible is met). Patient agreed to services and consent obtained.  Patient Care Team: [name], MD as PCP - General (Family Medicine) Lavera Guise, [facility] (Pharmacist)  Objective:  Lab Results  Component Value Date   CREATININE 0.84 09/03/2021   CREATININE 0.91 06/15/2021   CREATININE 1.34 (H) 06/02/2021    Lab Results  Component Value Date   HGBA1C 8.0 (H) 09/03/2021   Last diabetic Eye exam:  Lab Results  Component Value Date/Time   HMDIABEYEEXA No Retinopathy 10/21/2020 12:00 AM    Last diabetic Foot exam: No results found for: HMDIABFOOTEX      Component Value Date/Time   CHOL 130 09/03/2021 1521   TRIG 307 (H) 09/03/2021 1521   TRIG 125 10/24/2013 0930   HDL 35 (L) 09/03/2021 1521   HDL 35 (L) 10/24/2013 0930   CHOLHDL 3.7 09/03/2021 1521   LDLCALC 48 09/03/2021 1521   LDLCALC 84 10/24/2013 0930    Hepatic Function Latest Ref Rng & Units 09/03/2021 06/02/2021 04/16/2021  Total Protein 6.0 - 8.5 g/dL 6.8 6.9 6.9  Albumin 3.7 - 4.7 g/dL 4.7 4.6 4.6  AST 0 - 40 IU/L 16 21 14  ALT 0 - 44 IU/L _0 Alk Phosphatase 44 - 121 IU/L 73 63 71  Total Bilirubin 0.0 - 1.2 mg/dL 1.5(H) 1.8(H) 1.0    Lab Results  Component Value Date/Time   TSH 3.850 09/03/2021 03:21 PM   TSH 4.280 06/02/2021 02:41 PM   FREET4 1.23 09/03/2021 03:21 PM    CBC Latest Ref  Rng & Units 09/03/2021 06/15/2021 06/02/2021  WBC 3.4 - 10.8 x10E3/uL 6.4 8.0 5.2  Hemoglobin 13.0 - 17.7 g/dL 16.2 16.6 16.7  Hematocrit 37.5 - 51.0 % 45.8 49.2 48.9  Platelets 150 - 450 x10E3/uL 233 256 193    No results found for: VD25OH  Clinical ASCVD: No  The 10-year ASCVD risk score (Arnett DK, et al., 2019) is: 43%   Values used to calculate the score:     Age: 38 years     Sex: Male     Is Non-Hispanic African American: No     Diabetic: Yes     Tobacco smoker: No     Systolic Blood Pressure: 300 mmHg     Is BP treated: Yes     HDL Cholesterol: 35 mg/dL     Total Cholesterol: 130 mg/dL    Other: (CHADS2VASc if Afib, PHQ9 if depression, MMRC or CAT for COPD, ACT, DEXA)  Social History   Tobacco Use  Smoking Status Never  Smokeless Tobacco Never   BP Readings from Last 3 Encounters:  09/03/21 131/84  06/19/21 127/81  06/15/21 (!) 143/87   Pulse Readings from Last 3 Encounters:  09/03/21 89  06/19/21 100  06/15/21 98   Wt Readings from Last 3 Encounters:  06/17/21 157 lb 12.8 oz (71.6 kg)  06/15/21 162 lb 9.6 oz (73.8 kg)  06/02/21 159 lb 9.6 oz (72.4 kg)    Assessment: Review of patient past medical history, allergies, medications, health status, including review of consultants reports, laboratory and other test data, was performed as part of comprehensive evaluation and provision of chronic care management services.   SDOH:  (Social Determinants of Health) assessments and interventions performed:    CCM Care Plan  Allergies  Allergen Reactions   Sulfa Antibiotics Rash    Medications Reviewed Today     Reviewed by Lavera Guise, [facility] (Pharmacist) on 11/25/21 at 1010  Med List Status: <None>   Medication Order Taking? Sig Documenting Provider Last Dose Status Informant  canagliflozin (INVOKANA) 300 MG TABS tablet [id number]  Take 1 tablet (300 mg total) by mouth daily before breakfast. For diabetes [name], MD  Active   Continuous Blood Gluc  Sensor (FREESTYLE LIBRE 2 SENSOR) MISC [id number]  USE 1 SENSOR TO CHECK GLUCOSE  SIX TIMES DAILY [name], MD  Active   famotidine (PEPCID) 20 MG tablet [id number] No Take 1 tablet (20 mg total) by mouth daily. For reflux / heartburn  Patient taking differently: Take 20 mg by mouth daily as needed for heartburn or indigestion.   [name], MD Taking Active   lisinopril (ZESTRIL) 10 MG tablet [id number]  Take 1 tablet (10 mg total) by mouth daily. [name], MD  Active   metFORMIN (GLUCOPHAGE-XR) 500 MG 24 hr tablet [id number]  Take 1 tablet (500 mg total) by mouth daily with breakfast. [name], MD  Active   rosuvastatin (CRESTOR) 20 MG tablet [id number] No Take 1 tablet (20 mg total) by mouth daily. [name], MD Taking Active Spouse/Significant Other  Semaglutide (RYBELSUS) 7 MG TABS [id number]  Take 7 mg by mouth  daily. [name], MD  Active             Patient Active Problem List   Diagnosis Date Noted   Fusion of lumbar spine 06/17/2021   Spinal stenosis, lumbar region, with neurogenic claudication 01/30/2016   DM (diabetes mellitus) ([place]) 10/24/2013   Hyperlipidemia 10/24/2013    Immunization History  Administered Date(s) Administered   Influenza, High Dose Seasonal PF 11/01/2018   Moderna Sars-Covid-2 Vaccination 12/27/2019, 02/01/2020   Pneumococcal Conjugate-13 07/03/2014   Pneumococcal Polysaccharide-23 03/03/2018    Conditions to be addressed/monitored: HTN and DMII  Care Plan : PHARMD MEDICATION MANAGEMENT  Updates made by Lavera Guise, [place] since 11/27/2021 12:00 AM     Problem: DISEASE PROGRESSION PREVENTION      Goal: Patient Stated       Medication Assistance:  SEE ABOVE  Patient's preferred pharmacy is:  [facility] [address] [place] [address] [address] [place] [address] Phone: [phone] Fax: [phone]  [place] [address] [place] [address] Phone: [phone] Fax: [phone]  Uses pill box? No - N/A Pt endorses 100% compliance  Follow Up:  Patient agrees to Care Plan and Follow-up.  Plan: Telephone follow up appointment with care management team member scheduled for:  1-2 MONTHS  [name], PharmD, BCPS Clinical Pharmacist, [place]  II Phone [phone]

## 2021-11-27 NOTE — Patient Instructions (Signed)
Visit Information  Thank you for taking time to visit with me today. Please don't hesitate to contact me if I can be of assistance to you before our next scheduled telephone appointment.  Following are the goals we discussed today:  Current Barriers:  Unable to independently afford treatment regimen Unable to achieve control of T2DM  Suboptimal therapeutic regimen for T2DM   Pharmacist Clinical Goal(s):  patient will verbalize ability to afford treatment regimen achieve control of T2DM  as evidenced by GOAL A1C<7% maintain control of T2DM as evidenced by GOAL A1C<7%  through collaboration with PharmD and provider.    Interventions: 1:1 collaboration with [name], MD regarding development and update of comprehensive plan of care as evidenced by provider attestation and co-signature Inter-disciplinary care team collaboration (see longitudinal plan of care) Comprehensive medication review performed; medication list updated in electronic medical record  Diabetes: New goal. Uncontrolled (A1C 8%); current treatment:RYBELSUS 7MG , METFORMIN, NEW INVOKANA;  SWITCH RYBELSUS TO OZEMPIC 0.5MG --APPLY FOR [name] Denies personal and family history of Medullary thyroid cancer ([place]) CONTINUE METFORMIN SWITCH INVOKANA TO [place] CKD, CV & T2DM DATA WILL APPLY FOR AZ&ME PAP Denies hypoglycemic/hyperglycemic symptoms Discussed meal planning options and Plate method for healthy eating Avoid sugary drinks and desserts Incorporate balanced protein, non starchy veggies, 1 serving of carbohydrate with each meal Increase water intake Increase physical activity as able DISCUSSED blood pressure medications, at goal Current exercise: n/a Collaborated with PCP to change SGLT2 TO [place] patient finances. WILL ENROLL IN [name] FOR [place] AND AZ&ME FOR [place]   Patient Goals/Self-Care Activities patient will:  - take medications as prescribed as  evidenced by patient report and record review check glucose DAILY OR IF SYMPTOMATIC, document, and provide at future appointments collaborate with provider on medication access solutions   Please call the care guide team at [phone] if you need to cancel or reschedule your appointment.    The patient verbalized understanding of instructions, educational materials, and care plan provided today and declined offer to receive copy of patient instructions, educational materials, and care plan.   Signature [name], PharmD, BCPS Clinical Pharmacist, [place]  II Phone [phone]

## 2021-12-02 ENCOUNTER — Ambulatory Visit: Payer: PPO | Admitting: Pharmacist

## 2021-12-02 ENCOUNTER — Ambulatory Visit (INDEPENDENT_AMBULATORY_CARE_PROVIDER_SITE_OTHER): Payer: PPO | Admitting: Family Medicine

## 2021-12-02 ENCOUNTER — Encounter: Payer: Self-pay | Admitting: Family Medicine

## 2021-12-02 VITALS — BP 106/75 | HR 96 | Temp 97.0°F | Ht 68.0 in | Wt 153.2 lb

## 2021-12-02 DIAGNOSIS — E1141 Type 2 diabetes mellitus with diabetic mononeuropathy: Secondary | ICD-10-CM | POA: Diagnosis not present

## 2021-12-02 DIAGNOSIS — R1319 Other dysphagia: Secondary | ICD-10-CM

## 2021-12-02 DIAGNOSIS — E785 Hyperlipidemia, unspecified: Secondary | ICD-10-CM | POA: Diagnosis not present

## 2021-12-02 DIAGNOSIS — R7989 Other specified abnormal findings of blood chemistry: Secondary | ICD-10-CM

## 2021-12-02 LAB — BAYER DCA HB A1C WAIVED: HB A1C (BAYER DCA - WAIVED): 7.5 % — ABNORMAL HIGH (ref 4.8–5.6)

## 2021-12-02 MED ORDER — DAPAGLIFLOZIN PROPANEDIOL 10 MG PO TABS: 10.0000 mg | ORAL_TABLET | Freq: Every day | ORAL | 3 refills | Status: DC

## 2021-12-02 MED ORDER — OZEMPIC (0.25 OR 0.5 MG/DOSE) 2 MG/1.5ML ~~LOC~~ SOPN: 0.5000 mg | PEN_INJECTOR | SUBCUTANEOUS | 2 refills | Status: DC

## 2021-12-02 NOTE — Progress Notes (Signed)
Chronic Care Management Pharmacy Note  12/02/2021 Name:  Billy Nolan MRN:  [id number] DOB:  14-Dec-1945  Summary: t2dm  Recommendations/Changes made from today's visit: Diabetes: New goal. Uncontrolled (A1C 8%-->7.5% today); current treatment: RYBELSUS 7MG-->ozempic when it arrives, METFORMIN, NEW Farxiga;  SWITCH RYBELSUS TO [name] 0.25-0.5MG--APPLY [name] Denies personal and family history of Medullary thyroid cancer ([place]) CONTINUE METFORMIN SWITCH INVOKANA TO [name] CKD, CV & T2DM DATA WILL APPLY FOR AZ&ME PAP--sent today Okay per PCP (samples left up front for patient) Denies hypoglycemic/hyperglycemic symptoms Discussed meal planning options and Plate method for healthy eating Avoid sugary drinks and desserts Incorporate balanced protein, non starchy veggies, 1 serving of carbohydrate with each meal Increase water intake Increase physical activity as able DISCUSSED blood pressure medications, at goal Current exercise: n/a Collaborated with PCP to change SGLT2 TO FARXIGA; medication list updated Assessed patient finances. WILL ENROLL IN [name] FOR [name] AND AZ&ME FOR [name]   Patient Goals/Self-Care Activities patient will:  - take medications as prescribed as evidenced by patient report and record review check glucose DAILY OR IF SYMPTOMATIC, document, and provide at future appointments collaborate with provider on medication access solutions  Plan: f/u in 1 month  Subjective: Billy Nolan is an 75 y.o. year old male who is a primary patient of Stacks, Cletus Gash, MD.  The CCM team was consulted for assistance with disease management and care coordination needs.    Engaged with patient by telephone for follow up visit in response to provider referral for pharmacy case management and/or care coordination services.   Consent to Services:  The patient was given information about Chronic Care Management services, agreed to  services, and gave verbal consent prior to initiation of services.  Please see initial visit note for detailed documentation.   Patient Care Team: [name], MD as PCP - General (Family Medicine) Lavera Guise, [facility] (Pharmacist) Lavera Guise, [facility] as Pharmacist (Family Medicine)  Objective:  Lab Results  Component Value Date   CREATININE 0.84 09/03/2021   CREATININE 0.91 06/15/2021   CREATININE 1.34 (H) 06/02/2021    Lab Results  Component Value Date   HGBA1C 7.5 (H) 12/02/2021   Last diabetic Eye exam:  Lab Results  Component Value Date/Time   HMDIABEYEEXA No Retinopathy 10/21/2020 12:00 AM    Last diabetic Foot exam: No results found for: HMDIABFOOTEX      Component Value Date/Time   CHOL 130 09/03/2021 1521   TRIG 307 (H) 09/03/2021 1521   TRIG 125 10/24/2013 0930   HDL 35 (L) 09/03/2021 1521   HDL 35 (L) 10/24/2013 0930   CHOLHDL 3.7 09/03/2021 1521   LDLCALC 48 09/03/2021 1521   LDLCALC 84 10/24/2013 0930    Hepatic Function Latest Ref Rng & Units 09/03/2021 06/02/2021 04/16/2021  Total Protein 6.0 - 8.5 g/dL 6.8 6.9 6.9  Albumin 3.7 - 4.7 g/dL 4.7 4.6 4.6  AST 0 - 40 IU/L 16 21 14   ALT 0 - 44 IU/L 16 24 11   Alk Phosphatase 44 - 121 IU/L 73 63 71  Total Bilirubin 0.0 - 1.2 mg/dL 1.5(H) 1.8(H) 1.0    Lab Results  Component Value Date/Time   TSH 3.850 09/03/2021 03:21 PM   TSH 4.280 06/02/2021 02:41 PM   FREET4 1.23 09/03/2021 03:21 PM    CBC Latest Ref Rng & Units 09/03/2021 06/15/2021 06/02/2021  WBC 3.4 - 10.8 x10E3/uL 6.4 8.0 5.2  Hemoglobin 13.0 - 17.7 g/dL 16.2 16.6 16.7  Hematocrit 37.5 - 51.0 % 45.8 49.2 48.9  Platelets 150 - 450 x10E3/uL 233 256 193    No results found for: VD25OH  Clinical ASCVD: No  The 10-year ASCVD risk score (Arnett DK, et al., 2019) is: 31.9%   Values used to calculate the score:     Age: 21 years     Sex: Male     Is Non-Hispanic African American: No     Diabetic: Yes     Tobacco smoker: No     Systolic  Blood Pressure: 106 mmHg     Is BP treated: Yes     HDL Cholesterol: 35 mg/dL     Total Cholesterol: 130 mg/dL    Other: (CHADS2VASc if Afib, PHQ9 if depression, MMRC or CAT for COPD, ACT, DEXA)  Social History   Tobacco Use  Smoking Status Never  Smokeless Tobacco Never   BP Readings from Last 3 Encounters:  12/02/21 106/75  09/03/21 131/84  06/19/21 127/81   Pulse Readings from Last 3 Encounters:  12/02/21 96  09/03/21 89  06/19/21 100   Wt Readings from Last 3 Encounters:  12/02/21 153 lb 3.2 oz (69.5 kg)  06/17/21 157 lb 12.8 oz (71.6 kg)  06/15/21 162 lb 9.6 oz (73.8 kg)    Assessment: Review of patient past medical history, allergies, medications, health status, including review of consultants reports, laboratory and other test data, was performed as part of comprehensive evaluation and provision of chronic care management services.   SDOH:  (Social Determinants of Health) assessments and interventions performed:    CCM Care Plan  Allergies  Allergen Reactions   Sulfa Antibiotics Rash    Medications Reviewed Today     Reviewed by Lavera Guise, [facility] (Pharmacist) on 12/02/21 at [place] List Status: <None>   Medication Order Taking? Sig Documenting Provider Last Dose Status Informant  Continuous Blood Gluc Sensor (FREESTYLE LIBRE 2 SENSOR) MISC [id number] No USE 1 SENSOR TO CHECK GLUCOSE  SIX TIMES DAILY Stacks, Cletus Gash, MD Taking Active   dapagliflozin propanediol (FARXIGA) 10 MG TABS tablet [id number] Yes Take 1 tablet (10 mg total) by mouth daily before breakfast. [name], MD  Active   famotidine (PEPCID) 20 MG tablet [id number] No Take 1 tablet (20 mg total) by mouth daily. For reflux / heartburn  Patient taking differently: Take 20 mg by mouth daily as needed for heartburn or indigestion.   [name], MD Taking Active   lisinopril (ZESTRIL) 10 MG tablet [id number] No Take 1 tablet (10 mg total) by mouth daily. [name], MD Taking Active    rosuvastatin (CRESTOR) 20 MG tablet [id number] No Take 1 tablet (20 mg total) by mouth daily. [name], MD Taking Active Spouse/Significant Other  Semaglutide,0.25 or 0.5MG/DOS, (OZEMPIC, 0.25 OR 0.5 MG/DOSE,) 2 MG/1.5ML SOPN [id number] Yes Inject 0.5 mg into the skin once a week. [name], MD  Active             Patient Active Problem List   Diagnosis Date Noted   Fusion of lumbar spine 06/17/2021   Spinal stenosis, lumbar region, with neurogenic claudication 01/30/2016   DM (diabetes mellitus) ([place]) 10/24/2013   Hyperlipidemia 10/24/2013    Immunization History  Administered Date(s) Administered   Influenza, High Dose Seasonal PF 11/01/2018   Moderna Sars-Covid-2 Vaccination 12/27/2019, 02/01/2020   Pneumococcal Conjugate-13 07/03/2014   Pneumococcal Polysaccharide-23 03/03/2018    Conditions to be addressed/monitored: HLD and DMII  Care Plan : PHARMD MEDICATION MANAGEMENT  Updates made  by Lavera Guise, [place] since 12/02/2021 12:00 AM     Problem: DISEASE PROGRESSION PREVENTION      Long-Range Goal: T2DM   Recent Progress: Not on track  Priority: High  Note:   Current Barriers:  Unable to independently afford treatment regimen Unable to achieve control of T2DM  Suboptimal therapeutic regimen for T2DM  Pharmacist Clinical Goal(s):  patient will verbalize ability to afford treatment regimen achieve control of T2DM  as evidenced by GOAL A1C<7% maintain control of T2DM as evidenced by GOAL A1C<7%  through collaboration with PharmD and provider.    Interventions: 1:1 collaboration with [name], MD regarding development and update of comprehensive plan of care as evidenced by provider attestation and co-signature Inter-disciplinary care team collaboration (see longitudinal plan of care) Comprehensive medication review performed; medication list updated in electronic medical record  Diabetes: New goal. Uncontrolled (A1C 8%-->7.5% today); current  treatment: RYBELSUS 7MG-->ozempic when it arrives, METFORMIN, NEW Farxiga;  SWITCH RYBELSUS TO [place] 0.25-0.5MG--APPLY [place] Denies personal and family history of Medullary thyroid cancer ([place]) CONTINUE METFORMIN SWITCH INVOKANA TO [place] CKD, CV & T2DM DATA WILL APPLY FOR AZ&ME PAP--sent today Okay per PCP (samples left up front for patient) Denies hypoglycemic/hyperglycemic symptoms Discussed meal planning options and Plate method for healthy eating Avoid sugary drinks and desserts Incorporate balanced protein, non starchy veggies, 1 serving of carbohydrate with each meal Increase water intake Increase physical activity as able DISCUSSED blood pressure medications, at goal Current exercise: n/a Collaborated with PCP to change SGLT2 TO FARXIGA; medication list updated Assessed patient finances. WILL ENROLL IN [name] FOR [place] AND AZ&ME FOR [place]   Patient Goals/Self-Care Activities patient will:  - take medications as prescribed as evidenced by patient report and record review check glucose DAILY OR IF SYMPTOMATIC, document, and provide at future appointments collaborate with provider on medication access solutions      Medication Assistance:  farxiga via az&me; ozempic via novo nordisk  Patient's preferred pharmacy is:  [facility] [address], [place] - [place] [address] [place] [address] Phone: [phone] Fax: [phone]  [place], [place]  [address] Phone: [phone] Fax: [place], [place]. [place] [address] Phone: [phone] Fax: [phone]  Follow Up:  Patient agrees to Care Plan and Follow-up.  Plan: Telephone follow up appointment with care management team member scheduled for:  1 month   [name], PharmD, BCPS Clinical Pharmacist,  [place]  II Phone [phone]

## 2021-12-02 NOTE — Progress Notes (Signed)
Subjective:  Patient ID: Billy Nolan, male    DOB: [birth year]  Age: 75 y.o. MRN: [id number]  CC: Medical Management of Chronic Issues   HPI Billy Nolan presents forFollow-up of diabetes. Patient checks blood sugar at home.   126- 156 fasting and up to 176 postprandial Patient denies symptoms such as polyuria, polydipsia, excessive hunger, nausea No significant hypoglycemic spells noted. Medications reviewed. Pt reports taking them regularly without complication/adverse reaction being reported today.    History Billy Nolan has a past medical history of Allergy, Diabetes mellitus without complication ([place]), GERD (gastroesophageal reflux disease), Hyperlipidemia, and Hypertension.   He has a past surgical history that includes dental surgeries; Lumbar laminectomy/decompression microdiscectomy (Left, 01/30/2016); and Anterior lateral lumbar fusion with percutaneous screw 1 level (N/A, 06/17/2021).   His family history includes Cancer in his sister; Heart disease in his father and mother; Multiple sclerosis in his sister; Stroke in his father; Ulcerative colitis in his son.He reports that he has never smoked. He has never used smokeless tobacco. He reports that he does not drink alcohol and does not use drugs.  Current Outpatient Medications on File Prior to Visit  Medication Sig Dispense Refill   canagliflozin (INVOKANA) 300 MG TABS tablet Take 1 tablet (300 mg total) by mouth daily before breakfast. For diabetes 30 tablet 5   Continuous Blood Gluc Sensor (FREESTYLE LIBRE 2 SENSOR) MISC USE 1 SENSOR TO CHECK GLUCOSE  SIX TIMES DAILY 2 each 2   famotidine (PEPCID) 20 MG tablet Take 1 tablet (20 mg total) by mouth daily. For reflux / heartburn (Patient taking differently: Take 20 mg by mouth daily as needed for heartburn or indigestion.) 90 tablet 3   lisinopril (ZESTRIL) 10 MG tablet Take 1 tablet (10 mg total) by mouth daily. 90 tablet 3   rosuvastatin (CRESTOR) 20 MG tablet Take 1 tablet (20 mg  total) by mouth daily. 90 tablet 3   Semaglutide (RYBELSUS) 7 MG TABS Take 7 mg by mouth daily. 90 tablet 3   No current facility-administered medications on file prior to visit.    ROS Review of Systems  Constitutional:  Negative for fever.  HENT:  Positive for trouble swallowing (with eating meat will get stuck and have to work to bring i back up.).   Respiratory:  Negative for shortness of breath.   Cardiovascular:  Negative for chest pain.  Musculoskeletal:  Negative for arthralgias.  Skin:  Negative for rash.   Objective:  BP 106/75    Pulse 96    Temp (!) 97 F (36.1 C)    Ht 5' 8"  (1.727 m)    Wt 153 lb 3.2 oz (69.5 kg)    SpO2 98%    BMI 23.29 kg/m   BP Readings from Last 3 Encounters:  12/02/21 106/75  09/03/21 131/84  06/19/21 127/81    Wt Readings from Last 3 Encounters:  12/02/21 153 lb 3.2 oz (69.5 kg)  06/17/21 157 lb 12.8 oz (71.6 kg)  06/15/21 162 lb 9.6 oz (73.8 kg)     Physical Exam Constitutional:      General: He is not in acute distress.    Appearance: He is well-developed.  HENT:     Head: Normocephalic and atraumatic.     Right Ear: External ear normal.     Left Ear: External ear normal.     Nose: Nose normal.  Eyes:     Conjunctiva/sclera: Conjunctivae normal.     Pupils: Pupils are equal, round, and reactive to light.  Cardiovascular:     Rate and Rhythm: Normal rate and regular rhythm.     Heart sounds: Normal heart sounds. No murmur heard. Pulmonary:     Effort: Pulmonary effort is normal. No respiratory distress.     Breath sounds: Normal breath sounds. No wheezing or rales.  Abdominal:     Palpations: Abdomen is soft.     Tenderness: There is no abdominal tenderness.  Musculoskeletal:        General: Normal range of motion.     Cervical back: Normal range of motion and neck supple.  Skin:    General: Skin is warm and dry.  Neurological:     Mental Status: He is alert and oriented to person, place, and time.     Deep Tendon  Reflexes: Reflexes are normal and symmetric.  Psychiatric:        Behavior: Behavior normal.        Thought Content: Thought content normal.        Judgment: Judgment normal.      Assessment & Plan:   Billy Nolan was seen today for medical management of chronic issues.  Diagnoses and all orders for this visit:  Type 2 diabetes mellitus with diabetic mononeuropathy, without long-term current use of insulin (HCC) -     Bayer DCA Hb A1c Waived -     CBC with Differential/Platelet -     CMP14+EGFR  Hyperlipidemia, unspecified hyperlipidemia type -     Lipid panel  Abnormal TSH -     TSH + free T4  Esophageal dysphagia -     Ambulatory referral to Gastroenterology      I have discontinued Billy Nolan's metFORMIN. I am also having him maintain his rosuvastatin, famotidine, lisinopril, Rybelsus, canagliflozin, and FreeStyle Libre 2 Sensor.  No orders of the defined types were placed in this encounter.    Follow-up: Return in about 3 months (around 03/02/2022).  [name], M.D.

## 2021-12-02 NOTE — Patient Instructions (Addendum)
Visit Information  Thank you for taking time to visit with me today. Please don't hesitate to contact me if I can be of assistance to you before our next scheduled telephone appointment.  Following are the goals we discussed today:  Diabetes: New goal. Uncontrolled (A1C 8%-->7.5% today); current treatment: RYBELSUS 7MG -->ozempic when it arrives, METFORMIN, NEW Farxiga;  SWITCH RYBELSUS TO OZEMPIC 0.25-0.5MG --[place] Denies personal and family history of Medullary thyroid cancer (MTC) CONTINUE METFORMIN SWITCH INVOKANA TO [place], CV & T2DM DATA WILL APPLY FOR [place] PAP--sent today Okay per PCP (samples left up front for patient) Denies hypoglycemic/hyperglycemic symptoms Discussed meal planning options and Plate method for healthy eating Avoid sugary drinks and desserts Incorporate balanced protein, non starchy veggies, 1 serving of carbohydrate with each meal Increase water intake Increase physical activity as able DISCUSSED blood pressure medications, at goal Current exercise: n/a Collaborated with PCP to change SGLT2 TO FARXIGA; medication list updated Assessed patient finances. WILL ENROLL IN [name] FOR [place] AND [place] FOR [place]   Patient Goals/Self-Care Activities patient will:  - take medications as prescribed as evidenced by patient report and record review check glucose DAILY OR IF SYMPTOMATIC, document, and provide at future appointments collaborate with provider on medication access solutions  Plan: f/u in 1 month   Please call the care guide team at [phone] if you need to cancel or reschedule your appointment.   The patient verbalized understanding of instructions, educational materials, and care plan provided today and declined offer to receive copy of patient instructions, educational materials, and care plan.   [name], PharmD, BCPS Clinical Pharmacist, [place]  II Phone [phone]

## 2021-12-03 ENCOUNTER — Encounter: Payer: Self-pay | Admitting: Nurse Practitioner

## 2021-12-03 LAB — CBC WITH DIFFERENTIAL/PLATELET
Basophils Absolute: 0.1 10*3/uL (ref 0.0–0.2)
Basos: 1 %
EOS (ABSOLUTE): 0.2 10*3/uL (ref 0.0–0.4)
Eos: 4 %
Hematocrit: 51.3 % — ABNORMAL HIGH (ref 37.5–51.0)
Hemoglobin: 17.2 g/dL (ref 13.0–17.7)
Immature Grans (Abs): 0 10*3/uL (ref 0.0–0.1)
Immature Granulocytes: 0 %
Lymphocytes Absolute: 2 10*3/uL (ref 0.7–3.1)
Lymphs: 32 %
MCH: 30 pg (ref 26.6–33.0)
MCHC: 33.5 g/dL (ref 31.5–35.7)
MCV: 89 fL (ref 79–97)
Monocytes Absolute: 0.4 10*3/uL (ref 0.1–0.9)
Monocytes: 6 %
Neutrophils Absolute: 3.5 10*3/uL (ref 1.4–7.0)
Neutrophils: 57 %
Platelets: 228 10*3/uL (ref 150–450)
RBC: 5.74 x10E6/uL (ref 4.14–5.80)
RDW: 13.2 % (ref 11.6–15.4)
WBC: 6.2 10*3/uL (ref 3.4–10.8)

## 2021-12-03 LAB — LIPID PANEL
Chol/HDL Ratio: 4.8 ratio (ref 0.0–5.0)
Cholesterol, Total: 143 mg/dL (ref 100–199)
HDL: 30 mg/dL — ABNORMAL LOW (ref >39)
LDL Chol Calc (NIH): 64 mg/dL (ref 0–99)
Triglycerides: 306 mg/dL — ABNORMAL HIGH (ref 0–149)
VLDL Cholesterol Cal: 49 mg/dL — ABNORMAL HIGH (ref 5–40)

## 2021-12-03 LAB — CMP14+EGFR
ALT: 13 IU/L (ref 0–44)
AST: 14 IU/L (ref 0–40)
Albumin/Globulin Ratio: 2.1 (ref 1.2–2.2)
Albumin: 4.7 g/dL (ref 3.7–4.7)
Alkaline Phosphatase: 66 IU/L (ref 44–121)
BUN/Creatinine Ratio: 14 (ref 10–24)
BUN: 13 mg/dL (ref 8–27)
Bilirubin Total: 1.8 mg/dL — ABNORMAL HIGH (ref 0.0–1.2)
CO2: 20 mmol/L (ref 20–29)
Calcium: 10.1 mg/dL (ref 8.6–10.2)
Chloride: 99 mmol/L (ref 96–106)
Creatinine, Ser: 0.94 mg/dL (ref 0.76–1.27)
Globulin, Total: 2.2 g/dL (ref 1.5–4.5)
Glucose: 162 mg/dL — ABNORMAL HIGH (ref 70–99)
Potassium: 4.8 mmol/L (ref 3.5–5.2)
Sodium: 137 mmol/L (ref 134–144)
Total Protein: 6.9 g/dL (ref 6.0–8.5)
eGFR: 86 mL/min/{1.73_m2} (ref >59)

## 2021-12-03 LAB — TSH+FREE T4
Free T4: 1.23 ng/dL (ref 0.82–1.77)
TSH: 4.49 u[IU]/mL (ref 0.450–4.500)

## 2021-12-03 NOTE — Progress Notes (Signed)
Hello Helio,  Your lab result is normal and/or stable.Some minor variations that are not significant are commonly marked abnormal, but do not represent any medical problem for you.  Best regards, Stephaniemarie Stoffel, M.D.

## 2021-12-04 NOTE — Progress Notes (Signed)
Received notification from [place] regarding RE-ENROLLMENT approval for [facility]. Patient assistance approved from 12/03/21 to 12/05/22.  MEDICATION WILL SHIP TO PT'S HOME  Phone: [phone]

## 2021-12-05 DIAGNOSIS — E785 Hyperlipidemia, unspecified: Secondary | ICD-10-CM

## 2021-12-05 DIAGNOSIS — E1141 Type 2 diabetes mellitus with diabetic mononeuropathy: Secondary | ICD-10-CM

## 2021-12-06 DIAGNOSIS — E079 Disorder of thyroid, unspecified: Secondary | ICD-10-CM

## 2021-12-06 HISTORY — DX: Disorder of thyroid, unspecified: E07.9

## 2021-12-15 NOTE — Progress Notes (Signed)
Received notification from Billy Nolan regarding approval for [place]. Patient assistance approved from 12/14/21 to 11/04/22.  Phone: [phone]

## 2021-12-22 ENCOUNTER — Other Ambulatory Visit: Payer: Self-pay | Admitting: Family Medicine

## 2021-12-23 ENCOUNTER — Ambulatory Visit: Payer: PPO | Admitting: Nurse Practitioner

## 2021-12-23 ENCOUNTER — Encounter: Payer: Self-pay | Admitting: Nurse Practitioner

## 2021-12-23 VITALS — BP 118/76 | HR 95 | Ht 68.0 in | Wt 157.5 lb

## 2021-12-23 DIAGNOSIS — R1310 Dysphagia, unspecified: Secondary | ICD-10-CM

## 2021-12-23 DIAGNOSIS — K219 Gastro-esophageal reflux disease without esophagitis: Secondary | ICD-10-CM | POA: Diagnosis not present

## 2021-12-23 NOTE — Progress Notes (Signed)
ASSESSMENT AND PLAN    # Chronic intermittent solid food dysphagia possibly secondary to esophageal dysmotility, stricture, eosinophilic esophagitis --For further evaluation patient will be scheduled for an EGD with possible esophageal dilation. The risks and benefits of EGD with possible biopsies and dilation were discussed with the patient who agrees to proceed.  --Advised patient to eat small bites, chew well with liquids in between bites to avoid food impaction.  # Occasional regurgitation / heartburn ( a couple of times a month) relieved with Pepcid  --Continue Pepcid as needed for now. Further evaluation at time of EGD. If evidence for esophagitis / stricture he may require maintenance medication for GERD.   # DM, on Farxiga. Supposed to start [place] soon.   # Colon cancer screening. Due for 10 year recall colonoscopy in 2025  HISTORY OF PRESENT ILLNESS    Chief Complaint : Swallowing problems  Billy Nolan is a 76 y.o. male known to Dr. Henrene Pastor with a past medical history of HTN and diabetes.  Additional medical history as listed in [place] .   Patient is referred by his PCP for dysphagia. He was last seen at time of colonoscopy in 2015. He has been having intermittent problems swallowing , especially chicken over the ast few years. This occurs about once every couple of months.  He makes a point to chews his food well. Water can generally push the chicken down but sometimes it doesn't anddrinking water only produces a lot of bubbles in his mouth. During these times the chicken eventually comes back up into his mouth.   Billy Nolan has had occasional heartburn for several years. He has Pepcid on hand to take a needed which is generally about a one dose every other week. He tries not to eat late because it can result in nocturnal regurgitation.  He sleeps on 3 pillows.     Hepatic Function Latest Ref Rng & Units 12/02/2021 09/03/2021 06/02/2021  Total Protein 6.0 - 8.5 g/dL 6.9 6.8 6.9   Albumin 3.7 - 4.7 g/dL 4.7 4.7 4.6  AST 0 - 40 IU/L _0 ALT 0 - 44 IU/L _1 Alk Phosphatase 44 - 121 IU/L 66 73 63  Total Bilirubin 0.0 - 1.2 mg/dL 1.8(H) 1.5(H) 1.8(H)    CBC Latest Ref Rng & Units 12/02/2021 09/03/2021 06/15/2021  WBC 3.4 - 10.8 x10E3/uL 6.2 6.4 8.0  Hemoglobin 13.0 - 17.7 g/dL 17.2 16.2 16.6  Hematocrit 37.5 - 51.0 % 51.3(H) 45.8 49.2  Platelets 150 - 450 x10E3/uL 228 233 256     PREVIOUS ENDOSCOPIC EVALUATIONS / PERTINENT STUDIES:   October 2015 screening colonoscopy.  Complete exam, excellent prep --3 mm transverse colon polyp was removed.  Moderate diverticulosis of left colon  Polyp path -polypoid colorectal mucosa with intramucosal lymphoid aggregates.  No adenomatous change or malignancy.  -- 10-year colonoscopy recommended   Past Medical History:  Diagnosis Date   Allergy    Diabetes mellitus without complication (HCC)    GERD (gastroesophageal reflux disease)    Hyperlipidemia    Hypertension    Past Surgical History:  Procedure Laterality Date   ANTERIOR LATERAL LUMBAR FUSION WITH PERCUTANEOUS SCREW 1 LEVEL N/A 06/17/2021   Procedure: ANTERIOR LATERAL LUMBAR FUSION WITH PERCUTANEOUS SCREW 1 LEVEL (XLIF with PSFI) L3-4;  Surgeon: Melina Schools, MD;  Location: [facility];  Service: Orthopedics;  Laterality: N/A;  5 hrs Left tap block with exparel 3C bed post op   dental surgeries  teeth extractions, root cannal   LUMBAR LAMINECTOMY/DECOMPRESSION MICRODISCECTOMY Left 01/30/2016   Procedure: LUMBAR LAMINECTOMY/CENTRAL DECOMPRESSION MICRODISCECTOMY L3 - L4 ON THE LEFT;  Surgeon: Latanya Maudlin, MD;  Location: [facility];  Service: Orthopedics;  Laterality: Left;   Family History  Problem Relation Age of Onset   Heart disease Mother    Stroke Father    Heart disease Father    Multiple sclerosis Sister    Cancer Sister    Ulcerative colitis Son    Colon cancer Neg Hx    Rectal cancer Neg Hx    Esophageal cancer Neg Hx    Stomach cancer  Neg Hx    Pancreatic cancer Neg Hx       Allergies: Sulfa antibiotics causes a rash   Current Outpatient Medications  Medication Sig Dispense Refill   Continuous Blood Gluc Sensor (FREESTYLE LIBRE 2 SENSOR) MISC USE 1 SENSOR TO CHECK GLUCOSE  SIX TIMES DAILY 2 each 2   dapagliflozin propanediol (FARXIGA) 10 MG TABS tablet Take 1 tablet (10 mg total) by mouth daily before breakfast. 90 tablet 3   famotidine (PEPCID) 20 MG tablet Take 1 tablet (20 mg total) by mouth daily. For reflux / heartburn (Patient taking differently: Take 20 mg by mouth daily as needed for heartburn or indigestion.) 90 tablet 3   lisinopril (ZESTRIL) 10 MG tablet Take 1 tablet (10 mg total) by mouth daily. 90 tablet 3   rosuvastatin (CRESTOR) 20 MG tablet Take 1 tablet (20 mg total) by mouth daily. 90 tablet 3   Semaglutide,0.25 or 0.5MG/DOS, (OZEMPIC, 0.25 OR 0.5 MG/DOSE,) 2 MG/1.5ML SOPN Inject 0.5 mg into the skin once a week. 1.5 mL 2   No current facility-administered medications for this visit.    Review of Systems: No chest pain. No shortness of breath. No urinary complaints.   PHYSICAL EXAM :    Wt Readings from Last 3 Encounters:  12/02/21 153 lb 3.2 oz (69.5 kg)  06/17/21 157 lb 12.8 oz (71.6 kg)  06/15/21 162 lb 9.6 oz (73.8 kg)    BP 118/76    Pulse 95    Ht _0  (1.727 m)    Wt 157 lb 8 oz (71.4 kg)    SpO2 97%    BMI 23.95 kg/m  Constitutional:  Generally well appearing male in no acute distress. Psychiatric: Pleasant. Normal mood and affect. Behavior is normal. EENT: Pupils normal.  Conjunctivae are normal. No scleral icterus. Neck supple.  Cardiovascular: Normal rate, regular rhythm. No edema Pulmonary/chest: Effort normal and breath sounds normal. No wheezing, rales or rhonchi. Abdominal: Soft, nondistended, nontender. Bowel sounds active throughout. There are no masses palpable. No hepatomegaly. Neurological: Alert and oriented to person place and time. Skin: Skin is warm and dry. No  rashes noted.  [name], NP  12/23/2021, 9:25 AM  Cc:  [name], MD

## 2021-12-23 NOTE — Patient Instructions (Addendum)
You have been scheduled for an endoscopy. Please follow written instructions given to you at your visit today. If you use inhalers (even only as needed), please bring them with you on the day of your procedure.  If you are age 74 or older, your body mass index should be between 23-30. Your Body mass index is 23.95 kg/m. If this is out of the aforementioned range listed, please consider follow up with your Primary Care Provider.  If you are age 51 or younger, your body mass index should be between 19-25. Your Body mass index is 23.95 kg/m. If this is out of the aformentioned range listed, please consider follow up with your Primary Care Provider.   _____________________________________________________  The [facility] providers would like to encourage you to use [facility] to communicate with providers for non-urgent requests or questions.  Due to long hold times on the telephone, sending your provider a message by [facility] may be a faster and more efficient way to get a response.  Please allow 48 business hours for a response.  Please remember that this is for non-urgent requests.  ______________________________________________________  Acid Reflux --If you are taking anti-reflux medication be sure to take them 30 minutes before meal(s) --To help with acid reflux symptoms you should avoid evening meals / bedtime snacks. If able, elevate head of bed 6-8 inches. If unable to elevate the head of the bed consider purchasing a wedge pillow to sleep on at night.    --Weight reduction / maintain a healthy BMI ( body mass index) may be help with reflux symptoms  --Avoid trigger foods ( foods which you know tend to aggravate you reflux symptoms). Some of the more common triggers include spicy foods, fatty foods, acidic foods, and chocolate --Avoid caffeine

## 2021-12-23 NOTE — Progress Notes (Signed)
Assessment and plan reviewed 

## 2021-12-24 ENCOUNTER — Telehealth: Payer: Self-pay | Admitting: Pharmacist

## 2021-12-24 NOTE — Telephone Encounter (Signed)
Please let patient know:   2 bottles of Rybelsus 7mg  are ready for pick up at our office   We are switching him to Ozempic, but it has not arrived yet due to backorders.  Patient to continue Rybelsus 7mg  daily until Ozempic has arrived.  Supply chain issues/backorders have also delayed Ozempic shipments   Thank you! Almyra Free

## 2021-12-24 NOTE — Telephone Encounter (Signed)
Pt aware by phone 

## 2022-01-19 ENCOUNTER — Encounter: Payer: Self-pay | Admitting: Internal Medicine

## 2022-01-19 ENCOUNTER — Ambulatory Visit (AMBULATORY_SURGERY_CENTER): Payer: PPO | Admitting: Internal Medicine

## 2022-01-19 ENCOUNTER — Other Ambulatory Visit: Payer: Self-pay

## 2022-01-19 VITALS — BP 114/68 | HR 79 | Temp 97.1°F | Resp 17 | Ht 68.0 in | Wt 157.0 lb

## 2022-01-19 DIAGNOSIS — K219 Gastro-esophageal reflux disease without esophagitis: Secondary | ICD-10-CM | POA: Diagnosis not present

## 2022-01-19 DIAGNOSIS — K222 Esophageal obstruction: Secondary | ICD-10-CM | POA: Diagnosis not present

## 2022-01-19 DIAGNOSIS — R1310 Dysphagia, unspecified: Secondary | ICD-10-CM

## 2022-01-19 MED ORDER — SODIUM CHLORIDE 0.9 % IV SOLN: 500.0000 mL | Freq: Once | INTRAVENOUS | Status: DC

## 2022-01-19 MED ORDER — PANTOPRAZOLE SODIUM 40 MG PO TBEC: 40.0000 mg | DELAYED_RELEASE_TABLET | Freq: Every day | ORAL | 11 refills | Status: DC

## 2022-01-19 NOTE — Progress Notes (Signed)
VS- Billy Nolan 

## 2022-01-19 NOTE — Op Note (Signed)
[facility] Patient Name: Ryszard Socarras Procedure Date: 01/19/2022 9:59 AM MRN: [id number] Endoscopist: Docia Chuck. Henrene Pastor , MD Age: 76 Referring MD:  Date of Birth: [birth year] Gender: Male Account #: [id number] Procedure:                Upper GI endoscopy with balloon dilation of the                            esophagus. 18 mm max Indications:              Dysphagia, Heartburn Medicines:                Monitored Anesthesia Care Procedure:                Pre-Anesthesia Assessment:                           - Prior to the procedure, a History and Physical                            was performed, and patient medications and                            allergies were reviewed. The patient's tolerance of                            previous anesthesia was also reviewed. The risks                            and benefits of the procedure and the sedation                            options and risks were discussed with the patient.                            All questions were answered, and informed consent                            was obtained. Prior Anticoagulants: The patient has                            taken no previous anticoagulant or antiplatelet                            agents. ASA Grade Assessment: II - A patient with                            mild systemic disease. After reviewing the risks                            and benefits, the patient was deemed in                            satisfactory condition to undergo the procedure.  After obtaining informed consent, the endoscope was                            passed under direct vision. Throughout the                            procedure, the patient's blood pressure, pulse, and                            oxygen saturations were monitored continuously. The                            Endoscope was introduced through the mouth, and                            advanced to the second part of duodenum.  The upper                            GI endoscopy was accomplished without difficulty.                            The patient tolerated the procedure well. Scope In: Scope Out: Findings:                 The esophagus revealed esophagitis in the distal                            most portion is managed by edema and friability. No                            obvious Barrett's.                           One benign-appearing, intrinsic moderate stenosis                            was found 39 cm from the incisors. This stenosis                            measured 1.3 cm (inner diameter). The stenosis was                            traversed. After completing the endoscopic survey,                            A TTS dilator was passed through the scope.                            Dilation with a 16-17-18 mm balloon dilator was                            performed to 18 mm. See images.  The stomach was normal, save small hiatal hernia.                           The examined duodenum was normal.                           The cardia and gastric fundus were normal on                            retroflexion. Complications:            No immediate complications. Estimated Blood Loss:     Estimated blood loss: none. Impression:               1. GERD complicated by erosive esophagitis and                            peptic stricture status post dilation                           2. Small hiatal hernia, otherwise normal EGD Recommendation:           1. Patient has a contact number available for                            emergencies. The signs and symptoms of potential                            delayed complications were discussed with the                            patient. Return to normal activities tomorrow.                            Written discharge instructions were provided to the                            patient.                           2. Post dilation diet.                            3. Continue present medications.                           4. PRESCRIBE PANTOPRAZOLE 40 mg daily; #30; 11                            refills                           5. Office follow-up appointment with Dr. Henrene Pastor in 6                            to 8 weeks Docia Chuck. Henrene Pastor, MD 01/19/2022 10:20:20 AM This report has been signed electronically.

## 2022-01-19 NOTE — Patient Instructions (Signed)
YOU HAD AN ENDOSCOPIC PROCEDURE TODAY AT THE [facility]:   Refer to the procedure report that was given to you for any specific questions about what was found during the examination.  If the procedure report does not answer your questions, please call your gastroenterologist to clarify.  If you requested that your care partner not be given the details of your procedure findings, then the procedure report has been included in a sealed envelope for you to review at your convenience later. ° °**Handout given on post dilation diet °YOU SHOULD EXPECT: Some feelings of bloating in the abdomen. Passage of more gas than usual.  Walking can help get rid of the air that was put into your GI tract during the procedure and reduce the bloating. If you had a lower endoscopy (such as a colonoscopy or flexible sigmoidoscopy) you may notice spotting of blood in your stool or on the toilet paper. If you underwent a bowel prep for your procedure, you may not have a normal bowel movement for a few days. ° °Please Note:  You might notice some irritation and congestion in your nose or some drainage.  This is from the oxygen used during your procedure.  There is no need for concern and it should clear up in a day or so. ° °SYMPTOMS TO REPORT IMMEDIATELY: ° °Following upper endoscopy (EGD) ° Vomiting of blood or coffee ground material ° New chest pain or pain under the shoulder blades ° Painful or persistently difficult swallowing ° New shortness of breath ° Fever of 100°F or higher ° Black, tarry-looking stools ° °For urgent or emergent issues, a gastroenterologist can be reached at any hour by calling [phone]. °Do not use MyChart messaging for urgent concerns.  ° ° °DIET:  We do recommend a small meal at first, but then you may proceed to your regular diet.  Drink plenty of fluids but you should avoid alcoholic beverages for 24 hours. ° °ACTIVITY:  You should plan to take it easy for the rest of today and you should  NOT DRIVE or use heavy machinery until tomorrow (because of the sedation medicines used during the test).   ° °FOLLOW UP: °Our staff will call the number listed on your records 48-72 hours following your procedure to check on you and address any questions or concerns that you may have regarding the information given to you following your procedure. If we do not reach you, we will leave a message.  We will attempt to reach you two times.  During this call, we will ask if you have developed any symptoms of COVID 19. If you develop any symptoms (ie: fever, flu-like symptoms, shortness of breath, cough etc.) before then, please call [phone].  If you test positive for Covid 19 in the 2 weeks post procedure, please call and report this information to us.   ° °If any biopsies were taken you will be contacted by phone or by letter within the next 1-3 weeks.  Please call us at [phone] if you have not heard about the biopsies in 3 weeks.  ° ° °SIGNATURES/CONFIDENTIALITY: °You and/or your care partner have signed paperwork which will be entered into your electronic medical record.  These signatures attest to the fact that that the information above on your After Visit Summary has been reviewed and is understood.  Full responsibility of the confidentiality of this discharge information lies with you and/or your care-partner.  °

## 2022-01-19 NOTE — Progress Notes (Signed)
Sedate, gd SR, tolerated procedure well, VSS, report to RN 

## 2022-01-19 NOTE — Progress Notes (Signed)
ASSESSMENT AND PLAN     # Chronic intermittent solid food dysphagia possibly secondary to esophageal dysmotility, stricture, eosinophilic esophagitis --For further evaluation patient will be scheduled for an EGD with possible esophageal dilation. The risks and benefits of EGD with possible biopsies and dilation were discussed with the patient who agrees to proceed.  --Advised patient to eat small bites, chew well with liquids in between bites to avoid food impaction.   # Occasional regurgitation / heartburn ( a couple of times a month) relieved with Pepcid  --Continue Pepcid as needed for now. Further evaluation at time of EGD. If evidence for esophagitis / stricture he may require maintenance medication for GERD.    # DM, on Farxiga. Supposed to start [facility] soon.    # Colon cancer screening. Due for 10 year recall colonoscopy in 2025   HISTORY OF PRESENT ILLNESS     Chief Complaint : Swallowing problems   Billy Nolan is a 76 y.o. male known to Billy Nolan with a past medical history of HTN and diabetes.  Additional medical history as listed in [place] .    Patient is referred by his PCP for dysphagia. He was last seen at time of colonoscopy in 2015. He has been having intermittent problems swallowing , especially chicken over the ast few years. This occurs about once every couple of months.  He makes a point to chews his food well. Water can generally push the chicken down but sometimes it doesn't anddrinking water only produces a lot of bubbles in his mouth. During these times the chicken eventually comes back up into his mouth.    Billy Nolan has had occasional heartburn for several years. He has Pepcid on hand to take a needed which is generally about a one dose every other week. He tries not to eat late because it can result in nocturnal regurgitation.  He sleeps on 3 pillows.       Hepatic Function Latest Ref Rng & Units 12/02/2021 09/03/2021 06/02/2021  Total Protein 6.0 - 8.5 g/dL 6.9 6.8 6.9   Albumin 3.7 - 4.7 g/dL 4.7 4.7 4.6  AST 0 - 40 IU/L 14 16 21   ALT 0 - 44 IU/L 13 16 24   Alk Phosphatase 44 - 121 IU/L 66 73 63  Total Bilirubin 0.0 - 1.2 mg/dL 1.8(H) 1.5(H) 1.8(H)      CBC Latest Ref Rng & Units 12/02/2021 09/03/2021 06/15/2021  WBC 3.4 - 10.8 x10E3/uL 6.2 6.4 8.0  Hemoglobin 13.0 - 17.7 g/dL 17.2 16.2 16.6  Hematocrit 37.5 - 51.0 % 51.3(H) 45.8 49.2  Platelets 150 - 450 x10E3/uL 228 233 256        PREVIOUS ENDOSCOPIC EVALUATIONS / PERTINENT STUDIES:    October 2015 screening colonoscopy.  Complete exam, excellent prep --3 mm transverse colon polyp was removed.  Moderate diverticulosis of left colon  Polyp path -polypoid colorectal mucosa with intramucosal lymphoid aggregates.  No adenomatous change or malignancy.  -- 10-year colonoscopy recommended         Past Medical History:  Diagnosis Date   Allergy     Diabetes mellitus without complication (HCC)     GERD (gastroesophageal reflux disease)     Hyperlipidemia     Hypertension           Past Surgical History:  Procedure Laterality Date   ANTERIOR LATERAL LUMBAR FUSION WITH PERCUTANEOUS SCREW 1 LEVEL N/A 06/17/2021    Procedure: ANTERIOR LATERAL LUMBAR FUSION WITH PERCUTANEOUS SCREW 1 LEVEL (XLIF with PSFI) L3-4;  Surgeon: Melina Schools, MD;  Location: [facility];  Service: Orthopedics;  Laterality: N/A;  5 hrs Left tap block with exparel 3C bed post op   dental surgeries        teeth extractions, root cannal   LUMBAR LAMINECTOMY/DECOMPRESSION MICRODISCECTOMY Left 01/30/2016    Procedure: LUMBAR LAMINECTOMY/CENTRAL DECOMPRESSION MICRODISCECTOMY L3 - L4 ON THE LEFT;  Surgeon: Latanya Maudlin, MD;  Location: [facility];  Service: Orthopedics;  Laterality: Left;         Family History  Problem Relation Age of Onset   Heart disease Mother     Stroke Father     Heart disease Father     Multiple sclerosis Sister     Cancer Sister     Ulcerative colitis Son     Colon cancer Neg Hx     Rectal cancer Neg Hx      Esophageal cancer Neg Hx     Stomach cancer Neg Hx     Pancreatic cancer Neg Hx          Allergies: Sulfa antibiotics causes a rash         Current Outpatient Medications  Medication Sig Dispense Refill   Continuous Blood Gluc Sensor (FREESTYLE LIBRE 2 SENSOR) MISC USE 1 SENSOR TO CHECK GLUCOSE  SIX TIMES DAILY 2 each 2   dapagliflozin propanediol (FARXIGA) 10 MG TABS tablet Take 1 tablet (10 mg total) by mouth daily before breakfast. 90 tablet 3   famotidine (PEPCID) 20 MG tablet Take 1 tablet (20 mg total) by mouth daily. For reflux / heartburn (Patient taking differently: Take 20 mg by mouth daily as needed for heartburn or indigestion.) 90 tablet 3   lisinopril (ZESTRIL) 10 MG tablet Take 1 tablet (10 mg total) by mouth daily. 90 tablet 3   rosuvastatin (CRESTOR) 20 MG tablet Take 1 tablet (20 mg total) by mouth daily. 90 tablet 3   Semaglutide,0.25 or 0.5MG/DOS, (OZEMPIC, 0.25 OR 0.5 MG/DOSE,) 2 MG/1.5ML SOPN Inject 0.5 mg into the skin once a week. 1.5 mL 2    No current facility-administered medications for this visit.      Review of Systems: No chest pain. No shortness of breath. No urinary complaints.    PHYSICAL EXAM :        Wt Readings from Last 3 Encounters:  12/02/21 153 lb 3.2 oz (69.5 kg)  06/17/21 157 lb 12.8 oz (71.6 kg)  06/15/21 162 lb 9.6 oz (73.8 kg)      BP 118/76    Pulse 95    Ht 5' 8"  (1.727 m)    Wt 157 lb 8 oz (71.4 kg)    SpO2 97%    BMI 23.95 kg/m  Constitutional:  Generally well appearing male in no acute distress. Psychiatric: Pleasant. Normal mood and affect. Behavior is normal. EENT: Pupils normal.  Conjunctivae are normal. No scleral icterus. Neck supple.  Cardiovascular: Normal rate, regular rhythm. No edema Pulmonary/chest: Effort normal and breath sounds normal. No wheezing, rales or rhonchi. Abdominal: Soft, nondistended, nontender. Bowel sounds active throughout. There are no masses palpable. No hepatomegaly. Neurological: Alert and  oriented to person place and time. Skin: Skin is warm and dry. No rashes noted.   Billy Savoy, NP  12/23/2021, 9:25 AM  Patient was recently evaluated in the office by the GI nurse practitioner.  Full H&P as above.  No interval changes.  Now for upper endoscopy with probable esophageal dilation.

## 2022-01-19 NOTE — Progress Notes (Signed)
Called to room to assist during endoscopic procedure.  Patient ID and intended procedure confirmed with present staff. Received instructions for my participation in the procedure from the performing physician.  

## 2022-01-21 ENCOUNTER — Telehealth: Payer: Self-pay

## 2022-01-21 NOTE — Telephone Encounter (Signed)
  Follow up Call-  Call back number 01/19/2022  Post procedure Call Back phone  # [phone]  Permission to leave phone message Yes  Some recent data might be hidden     Patient questions:  Do you have a fever, pain , or abdominal swelling? No. Pain Score  0 *  Have you tolerated food without any problems? Yes.    Have you been able to return to your normal activities? Yes.    Do you have any questions about your discharge instructions: Diet   No. Medications  No. Follow up visit  No.  Do you have questions or concerns about your Care? No.  Actions: * If pain score is 4 or above: No action needed, pain <4.

## 2022-02-03 ENCOUNTER — Ambulatory Visit (INDEPENDENT_AMBULATORY_CARE_PROVIDER_SITE_OTHER): Payer: PPO | Admitting: Pharmacist

## 2022-02-03 DIAGNOSIS — E785 Hyperlipidemia, unspecified: Secondary | ICD-10-CM

## 2022-02-03 DIAGNOSIS — E1141 Type 2 diabetes mellitus with diabetic mononeuropathy: Secondary | ICD-10-CM

## 2022-02-10 NOTE — Progress Notes (Signed)
Chronic Care Management Pharmacy Note  02/03/2022 Name:  Billy Nolan MRN:  [id number] DOB:  1946/11/25  Summary: T2DM, CKD  Diabetes: Goal on Track (progressing): YES. Uncontrolled-improved (A1C 8%-->7.5%); current treatment: RYBELSUS 7MG-->starting Ozempic 0.73m weekly METFORMIN, NEW Farxiga;  SWITCH RYBELSUS TO OZEMPIC 0.25-0.5MG-- Patient reports gerd/hiatal hernia issues likely aggravated by rybelsus-->injection will likely be better tolerated WILL TITRATED TO 0.5MG IN 4 WEEKS AS TOLERATED [name](shipment arrived today #5 boxes) Education and demo provided Denies personal and family history of Medullary thyroid cancer (MTC) CONTINUE METFORMIN CONTINUE FARXIGA 10MG DAILY BETTER CKD, CV & T2DM DATA APPROVED FOR [place] PATIENT ASSISTANCE Denies hypoglycemic/hyperglycemic symptoms Discussed meal planning options and Plate method for healthy eating Avoid sugary drinks and desserts Incorporate balanced protein, non starchy veggies, 1 serving of carbohydrate with each meal Increase water intake Increase physical activity as able DISCUSSED blood pressure medications, at goal Current exercise: n/a Collaborated with PCP to change GLP1, SGLT2; medication list updated Assessed patient finances. ENROLLED IN [name] FOR OZEMPIC AND [place] PATIENT ASSISTANCE FOR FWilder [place] Subjective: Billy Skoogis an 76y.o. year old male who is a primary patient of Stacks, WCletus Gash MD.  The CCM team was consulted for assistance with disease management and care coordination needs.    Engaged with patient face to face for follow up visit in response to provider referral for pharmacy case management and/or care coordination services.   Consent to Services:  The patient was given information about Chronic Care Management services, agreed to services, and gave verbal consent prior to initiation of services.  Please see initial visit note for detailed documentation.    Patient Care Team: SClaretta Fraise MD as PCP - General (Family Medicine) [facility](Ph[facility])  Objective:  Lab Results  Component Value Date   CREATININE 0.94 12/02/2021   CREATININE 0.84 09/03/2021   CREATININE 0.91 06/15/2021    Lab Results  Component Value Date   HGBA1C 7.5 (H) 12/02/2021   Last diabetic Eye exam:  Lab Results  Component Value Date/Time   HMDIABEYEEXA No Retinopathy 10/21/2020 12:00 AM    Last diabetic Foot exam: No results found for: HMDIABFOOTEX      Component Value Date/Time   CHOL 143 12/02/2021 0819   TRIG 306 (H) 12/02/2021 0819   TRIG 125 10/24/2013 0930   HDL 30 (L) 12/02/2021 0819   HDL 35 (L) 10/24/2013 0930   CHOLHDL 4.8 12/02/2021 0819   LDLCALC 64 12/02/2021 0819   LDLCALC 84 10/24/2013 0930    Hepatic Function Latest Ref Rng & Units 12/02/2021 09/03/2021 06/02/2021  Total Protein 6.0 - 8.5 g/dL 6.9 6.8 6.9  Albumin 3.7 - 4.7 g/dL 4.7 4.7 4.6  AST 0 - 40 IU/L 14 16 21   ALT 0 - 44 IU/L 13 16 24   Alk Phosphatase 44 - 121 IU/L 66 73 63  Total Bilirubin 0.0 - 1.2 mg/dL 1.8(H) 1.5(H) 1.8(H)    Lab Results  Component Value Date/Time   TSH 4.490 12/02/2021 08:19 AM   TSH 3.850 09/03/2021 03:21 PM   FREET4 1.23 12/02/2021 08:19 AM   FREET4 1.23 09/03/2021 03:21 PM    CBC Latest Ref Rng & Units 12/02/2021 09/03/2021 06/15/2021  WBC 3.4 - 10.8 x10E3/uL 6.2 6.4 8.0  Hemoglobin 13.0 - 17.7 g/dL 17.2 16.2 16.6  Hematocrit 37.5 - 51.0 % 51.3(H) 45.8 49.2  Platelets 150 - 450 x10E3/uL 228 233 256    No results found for: VD25OH  Clinical ASCVD: No  The 10-year ASCVD risk score (Arnett DK, et al., 2019) is: 40.5%   Values used to calculate the score:     Age: 76 years     Sex: Male     Is Non-Hispanic African American: No     Diabetic: Yes     Tobacco smoker: No     Systolic Blood Pressure: 570 mmHg     Is BP treated: Yes     HDL Cholesterol: 30 mg/dL     Total Cholesterol: 143 mg/dL    Other: (CHADS2VASc if  Afib, PHQ9 if depression, MMRC or CAT for COPD, ACT, DEXA)  Social History   Tobacco Use  Smoking Status Never  Smokeless Tobacco Never   BP Readings from Last 3 Encounters:  01/19/22 114/68  12/23/21 118/76  12/02/21 106/75   Pulse Readings from Last 3 Encounters:  01/19/22 79  12/23/21 95  12/02/21 96   Wt Readings from Last 3 Encounters:  01/19/22 157 lb (71.2 kg)  12/23/21 157 lb 8 oz (71.4 kg)  12/02/21 153 lb 3.2 oz (69.5 kg)    Assessment: Review of patient past medical history, allergies, medications, health status, including review of consultants reports, laboratory and other test data, was performed as part of comprehensive evaluation and provision of chronic care management services.   SDOH:  (Social Determinants of Health) assessments and interventions performed:    CCM Care Plan  Allergies  Allergen Reactions   Sulfa Antibiotics Rash    Medications Reviewed Today     Reviewed by Lavera Guise, [facility] (Pharmacist) on 02/10/22 at 1014  Med List Status: <None>   Medication Order Taking? Sig Documenting Provider Last Dose Status Informant  0.9 %  sodium chloride infusion [id number]   Irene Shipper, MD  Active   Continuous Blood Gluc Sensor (FREESTYLE LIBRE 2 SENSOR) MISC [id number] No CHECK BLOOD GLUCOSE SIX TIMES DAILY Dx E11.9 [name], MD off Active   dapagliflozin propanediol (FARXIGA) 10 MG TABS tablet [id number] No Take 1 tablet (10 mg total) by mouth daily before breakfast. [name], MD 01/18/2022 Active            Med Note (Yomar Mejorado D   Wed Feb 10, 2022 10:14 AM) Via [place]&me patient assistance program   famotidine (PEPCID) 20 MG tablet [id number] No Take 1 tablet (20 mg total) by mouth daily. For reflux / heartburn  Patient taking differently: Take 20 mg by mouth daily as needed for heartburn or indigestion.   [name], MD 01/18/2022 Active   lisinopril (ZESTRIL) 10 MG tablet [id number] No Take 1 tablet (10 mg total) by mouth daily.  [name], MD 01/18/2022 Active   pantoprazole (PROTONIX) 40 MG tablet [id number]  Take 1 tablet (40 mg total) by mouth daily. Irene Shipper, MD  Active   rosuvastatin (CRESTOR) 20 MG tablet [id number] No Take 1 tablet (20 mg total) by mouth daily. [name], MD 01/18/2022 Active Spouse/Significant Other  Semaglutide (RYBELSUS) 7 MG TABS [id number] No Take by mouth daily. [provider] 01/18/2022 Active   Semaglutide,0.25 or 0.5MG/DOS, (OZEMPIC, 0.25 OR 0.5 MG/DOSE,) 2 MG/1.5ML SOPN [id number] No Inject 0.5 mg into the skin once a week. [name], MD Taking Active            Med Note Blanca Friend, Royce Macadamia   Wed Feb 10, 2022 10:14 AM) Via novo nordisk patient assistance            Patient Active Problem List  Diagnosis Date Noted   Fusion of lumbar spine 06/17/2021   Spinal stenosis, lumbar region, with neurogenic claudication 01/30/2016   DM (diabetes mellitus) ([place]) 10/24/2013   Hyperlipidemia 10/24/2013    Immunization History  Administered Date(s) Administered   Influenza, High Dose Seasonal PF 11/01/2018   Moderna Sars-Covid-2 Vaccination 12/27/2019, 02/01/2020   Pneumococcal Conjugate-13 07/03/2014   Pneumococcal Polysaccharide-23 03/03/2018    Conditions to be addressed/monitored: DMII  Care Plan : PHARMD MEDICATION MANAGEMENT  Updates made by Lavera Guise, [place] since 02/10/2022 12:00 AM     Problem: DISEASE PROGRESSION PREVENTION      Long-Range Goal: T2DM   Recent Progress: Not on track  Priority: High  Note:   Current Barriers:  Unable to independently afford treatment regimen Unable to achieve control of T2DM  Suboptimal therapeutic regimen for T2DM  Pharmacist Clinical Goal(s):  patient will verbalize ability to afford treatment regimen achieve control of T2DM  as evidenced by GOAL A1C<7% maintain control of T2DM as evidenced by GOAL A1C<7%  through collaboration with PharmD and provider.    Interventions: 1:1 collaboration with [name], MD regarding development and update of comprehensive plan of care as evidenced by provider attestation and co-signature Inter-disciplinary care team collaboration (see longitudinal plan of care) Comprehensive medication review performed; medication list updated in electronic medical record  Diabetes: Goal on Track (progressing): YES. Uncontrolled-improved (A1C 8%-->7.5%); current treatment: RYBELSUS 7MG-->starting Ozempic 0.74m weekly METFORMIN, NEW Farxiga;  SWITCH RYBELSUS TO OZEMPIC 0.25-0.5MG-- Patient reports gerd/hiatal hernia issues likely aggravated by rybelsus-->injection will likely be better tolerated Approved for [name] [place](shipment arrived today #5 boxes) Education and demo provided Denies personal and family history of Medullary thyroid cancer (MTC) CONTINUE METFORMIN CONTINUE FARXIGA 10MG DAILY BETTER CKD, CV & T2DM DATA APPROVED FOR [place] PATIENT ASSISTANCE Denies hypoglycemic/hyperglycemic symptoms Discussed meal planning options and Plate method for healthy eating Avoid sugary drinks and desserts Incorporate balanced protein, non starchy veggies, 1 serving of carbohydrate with each meal Increase water intake Increase physical activity as able DISCUSSED blood pressure medications, at goal Current exercise: n/a Collaborated with PCP to change GLP1, SGLT2; medication list updated Assessed patient finances. ENROLLED [place] [name] [place] PATIENT [place] FOR FARXIGA   Patient Goals/Self-Care Activities patient will:  - take medications as prescribed as evidenced by patient report and record review check glucose DAILY OR IF SYMPTOMATIC, document, and provide at future appointments collaborate with provider on medication access solutions      Medication Assistance:  ozempic obtained through novo nordisk medication assistance program.  Enrollment ends 11/04/2022  [facility]'s [facility]:  [address] [address]Phone: [phone]: [phone] [address] [place]Phone: [phone]: [address] [address] [place]Phone: [phone]: [phone] Follow Up:  Patient agrees to Care Plan and Follow-up.  Plan: Telephone follow up appointment with care management team member scheduled for:  1 month  JRegina Eck PharmD, BCPS Clinical Pharmacist, [place] Phone [phone]

## 2022-02-10 NOTE — Patient Instructions (Signed)
Visit Information ? ?Following are the goals we discussed today: Current Barriers:  ?Unable to independently afford treatment regimen ?Unable to achieve control of T2DM  ?Suboptimal therapeutic regimen for T2DM ? ?Pharmacist Clinical Goal(s):  ?patient will verbalize ability to afford treatment regimen ?achieve control of T2DM  as evidenced by GOAL A1C<7% ?maintain control of T2DM as evidenced by GOAL A1C<7%  through collaboration with PharmD and provider.  ? ? ?Interventions: ?1:1 collaboration with [name], MD regarding development and update of comprehensive plan of care as evidenced by provider attestation and co-signature ?Inter-disciplinary care team collaboration (see longitudinal plan of care) ?Comprehensive medication review performed; medication list updated in electronic medical record ? ?Diabetes: Goal on Track (progressing): YES. ?Uncontrolled-improved (A1C 8%-->7.5%); current treatment: RYBELSUS '7MG'$ -->starting Ozempic 0.'25mg'$  weekly METFORMIN, NEW Farxiga;  ?SWITCH RYBELSUS TO OZEMPIC 0.25-0.'5MG'$ -- ?Patient reports gerd/hiatal hernia issues likely aggravated by rybelsus-->injection will likely be better tolerated ?Approved for [name] [place] (shipment arrived today #5 boxes) ?Education and demo provided ?Denies personal and family history of Medullary thyroid cancer (MTC) ?CONTINUE METFORMIN ?CONTINUE FARXIGA '10MG'$  DAILY ?BETTER CKD, CV & T2DM DATA ?APPROVED FOR AZ&ME PATIENT ASSISTANCE ?Denies hypoglycemic/hyperglycemic symptoms ?Discussed meal planning options and Plate method for healthy eating ?Avoid sugary drinks and desserts ?Incorporate balanced protein, non starchy veggies, 1 serving of carbohydrate with each meal ?Increase water intake ?Increase physical activity as able ?DISCUSSED blood pressure medications, at goal ?Current exercise: n/a ?Collaborated with PCP to change GLP1, SGLT2; medication list updated ?Assessed patient finances. ENROLLED IN [name] FOR [place]  AND [place]&ME PATIENT ASSISTANCE FOR FARXIGA ? ? ?Patient Goals/Self-Care Activities ?patient will:  ?- take medications as prescribed as evidenced by patient report and record review ?check glucose DAILY OR IF SYMPTOMATIC, document, and provide at future appointments ?collaborate with provider on medication access solutions ? ?Plan: Telephone follow up appointment with care management team member scheduled for:  1 month ? ?Signature ?[name], PharmD, BCPS ?Clinical Pharmacist, [facility] ?[place]  [facility] Phone [phone] ? ? ?Please call the care guide team at [phone] if you need to cancel or reschedule your appointment.  ? ?The patient verbalized understanding of instructions, educational materials, and care plan provided today and declined offer to receive copy of patient instructions, educational materials, and care plan.  ? ?

## 2022-03-03 ENCOUNTER — Ambulatory Visit (INDEPENDENT_AMBULATORY_CARE_PROVIDER_SITE_OTHER): Payer: PPO | Admitting: Family Medicine

## 2022-03-03 ENCOUNTER — Encounter: Payer: Self-pay | Admitting: Family Medicine

## 2022-03-03 VITALS — Ht 68.0 in

## 2022-03-03 DIAGNOSIS — R7989 Other specified abnormal findings of blood chemistry: Secondary | ICD-10-CM | POA: Diagnosis not present

## 2022-03-03 DIAGNOSIS — E785 Hyperlipidemia, unspecified: Secondary | ICD-10-CM

## 2022-03-03 DIAGNOSIS — Z23 Encounter for immunization: Secondary | ICD-10-CM | POA: Diagnosis not present

## 2022-03-03 DIAGNOSIS — E1141 Type 2 diabetes mellitus with diabetic mononeuropathy: Secondary | ICD-10-CM

## 2022-03-03 LAB — BAYER DCA HB A1C WAIVED: HB A1C (BAYER DCA - WAIVED): 7.5 % — ABNORMAL HIGH (ref 4.8–5.6)

## 2022-03-03 MED ORDER — ROSUVASTATIN CALCIUM 20 MG PO TABS: 20.0000 mg | ORAL_TABLET | Freq: Every day | ORAL | 3 refills | Status: DC

## 2022-03-03 MED ORDER — FAMOTIDINE 20 MG PO TABS: 20.0000 mg | ORAL_TABLET | Freq: Every day | ORAL | 3 refills | Status: DC

## 2022-03-03 NOTE — Progress Notes (Signed)
? ?Subjective:  ?Patient ID: Billy Nolan,  ?male    DOB: 05-16-1946  Age: 76 y.o.  ? ? ?CC: Medical Management of Chronic Issues ? ? ?HPI ?Billy Nolan presents for  follow-up of hypertension. Patient has no history of headache chest pain or shortness of breath or recent cough. Patient also denies symptoms of TIA such as numbness weakness lateralizing. Patient denies side effects from medication. States taking it regularly. Run a little low at home. ? ?Patient also  in for follow-up of elevated cholesterol. Doing well without complaints on current medication. Denies side effects  including myalgia and arthralgia and nausea. Also in today for liver function testing. Currently no chest pain, shortness of breath or other cardiovascular related symptoms noted. ? ?Follow-up of diabetes. Patient does check blood sugar at home. Patient denies symptoms such as excessive hunger or urinary frequency, excessive hunger, nausea ?No significant hypoglycemic spells noted. ?Medications reviewed. Pt reports taking them regularly. Pt. denies complication/adverse reaction today. Recent switch from rybelsus. Now on .25 ozempic. Due to increase to  in 3 days. ? ? ?History ?Billy Nolan has a past medical history of Allergy, Diabetes mellitus without complication ([place]), GERD (gastroesophageal reflux disease), Hyperlipidemia, and Hypertension.  ? ?He has a past surgical history that includes dental surgeries; Lumbar laminectomy/decompression microdiscectomy (Left, 01/30/2016); Anterior lateral lumbar fusion with percutaneous screw 1 level (N/A, 06/17/2021); and Colonoscopy.  ? ?His family history includes Cancer in his sister; Heart disease in his father and mother; Multiple sclerosis in his sister; Stroke in his father; Ulcerative colitis in his son.He reports that he has never smoked. He has never used smokeless tobacco. He reports that he does not drink alcohol and does not use drugs. ? ?Current Outpatient Medications on File Prior to Visit   ?Medication Sig Dispense Refill  ? Continuous Blood Gluc Sensor (FREESTYLE LIBRE 2 SENSOR) MISC CHECK BLOOD GLUCOSE SIX TIMES DAILY Dx E11.9 2 each 2  ? dapagliflozin propanediol (FARXIGA) 10 MG TABS tablet Take 1 tablet (10 mg total) by mouth daily before breakfast. 90 tablet 3  ? lisinopril (ZESTRIL) 10 MG tablet Take 1 tablet (10 mg total) by mouth daily. 90 tablet 3  ? pantoprazole (PROTONIX) 40 MG tablet Take 1 tablet (40 mg total) by mouth daily. 30 tablet 11  ? Semaglutide,0.25 or 0.5MG/DOS, (OZEMPIC, 0.25 OR 0.5 MG/DOSE,) 2 MG/1.5ML SOPN Inject 0.5 mg into the skin once a week. 1.5 mL 2  ? ?Current Facility-Administered Medications on File Prior to Visit  ?Medication Dose Route Frequency Provider Last Rate Last Admin  ? 0.9 %  sodium chloride infusion  500 mL Intravenous Once Irene Shipper, MD      ? ? ?ROS ?Review of Systems  ?Constitutional:  Negative for fever.  ?Respiratory:  Negative for shortness of breath.   ?Cardiovascular:  Negative for chest pain.  ?Musculoskeletal:  Negative for arthralgias.  ?Skin:  Negative for rash.  ? ?Objective:  ?Ht 5' 8"  (1.727 m)   BMI 23.87 kg/m?  ? ?BP Readings from Last 3 Encounters:  ?01/19/22 114/68  ?12/23/21 118/76  ?12/02/21 106/75  ? ? ?Wt Readings from Last 3 Encounters:  ?01/19/22 157 lb (71.2 kg)  ?12/23/21 157 lb 8 oz (71.4 kg)  ?12/02/21 153 lb 3.2 oz (69.5 kg)  ? ? ? ?Physical Exam ?Constitutional:   ?   General: He is not in acute distress. ?   Appearance: He is well-developed.  ?HENT:  ?   Head: Normocephalic and atraumatic.  ?  Right Ear: External ear normal.  ?   Left Ear: External ear normal.  ?   Nose: Nose normal.  ?Eyes:  ?   Conjunctiva/sclera: Conjunctivae normal.  ?   Pupils: Pupils are equal, round, and reactive to light.  ?Cardiovascular:  ?   Rate and Rhythm: Normal rate and regular rhythm.  ?   Heart sounds: Normal heart sounds. No murmur heard. ?Pulmonary:  ?   Effort: Pulmonary effort is normal. No respiratory distress.  ?   Breath  sounds: Normal breath sounds. No wheezing or rales.  ?Abdominal:  ?   Palpations: Abdomen is soft.  ?   Tenderness: There is no abdominal tenderness.  ?Musculoskeletal:     ?   General: Normal range of motion.  ?   Cervical back: Normal range of motion and neck supple.  ?Skin: ?   General: Skin is warm and dry.  ?Neurological:  ?   Mental Status: He is alert and oriented to person, place, and time.  ?   Deep Tendon Reflexes: Reflexes are normal and symmetric.  ?Psychiatric:     ?   Behavior: Behavior normal.     ?   Thought Content: Thought content normal.     ?   Judgment: Judgment normal.  ? ? ?Diabetic Foot Exam - Simple   ?No data filed ?  ? ? ? ? ?Assessment & Plan:  ? ?Jacorie was seen today for medical management of chronic issues. ? ?Diagnoses and all orders for this visit: ? ?Type 2 diabetes mellitus with diabetic mononeuropathy, without long-term current use of insulin ([place]) ?-     Bayer DCA Hb A1c Waived ?-     CBC with Differential/Platelet ?-     CMP14+EGFR ?-     rosuvastatin (CRESTOR) 20 MG tablet; Take 1 tablet (20 mg total) by mouth daily. ?-     Microalbumin / creatinine urine ratio ? ?Hyperlipidemia, unspecified hyperlipidemia type ?-     Lipid panel ?-     rosuvastatin (CRESTOR) 20 MG tablet; Take 1 tablet (20 mg total) by mouth daily. ? ?Abnormal TSH ?-     TSH + free T4 ? ?Need for shingles vaccine ?-     Varicella-zoster vaccine IM (Shingrix) ? ?Need for Tdap vaccination ?-     Tdap vaccine greater than or equal to 7yo IM ? ?Other orders ?-     famotidine (PEPCID) 20 MG tablet; Take 1 tablet (20 mg total) by mouth daily. For reflux / heartburn ? ? ?I am having [name] maintain his lisinopril, dapagliflozin propanediol, Ozempic (0.25 or 0.5 MG/DOSE), FreeStyle Libre 2 Sensor, pantoprazole, famotidine, and rosuvastatin. We will continue to administer sodium chloride. ? ?Meds ordered this encounter  ?Medications  ? famotidine (PEPCID) 20 MG tablet  ?  Sig: Take 1 tablet (20 mg total) by mouth  daily. For reflux / heartburn  ?  Dispense:  90 tablet  ?  Refill:  3  ? rosuvastatin (CRESTOR) 20 MG tablet  ?  Sig: Take 1 tablet (20 mg total) by mouth daily.  ?  Dispense:  90 tablet  ?  Refill:  3  ? ?Monitor hiccups for now. Due F/U with GI soon.  ?Check microalbumin. If negative will DC lisinopril due to low BP. ? ?Follow-up: Return in about 3 months (around 06/03/2022). ? ?[name], M.D. ?

## 2022-03-04 ENCOUNTER — Ambulatory Visit: Payer: PPO | Admitting: Pharmacist

## 2022-03-04 DIAGNOSIS — E785 Hyperlipidemia, unspecified: Secondary | ICD-10-CM

## 2022-03-04 DIAGNOSIS — E1141 Type 2 diabetes mellitus with diabetic mononeuropathy: Secondary | ICD-10-CM

## 2022-03-04 LAB — CBC WITH DIFFERENTIAL/PLATELET
Basophils Absolute: 0 10*3/uL (ref 0.0–0.2)
Basos: 1 %
EOS (ABSOLUTE): 0.2 10*3/uL (ref 0.0–0.4)
Eos: 3 %
Hematocrit: 53 % — ABNORMAL HIGH (ref 37.5–51.0)
Hemoglobin: 18.1 g/dL — ABNORMAL HIGH (ref 13.0–17.7)
Immature Grans (Abs): 0 10*3/uL (ref 0.0–0.1)
Immature Granulocytes: 0 %
Lymphocytes Absolute: 1.8 10*3/uL (ref 0.7–3.1)
Lymphs: 32 %
MCH: 30.9 pg (ref 26.6–33.0)
MCHC: 34.2 g/dL (ref 31.5–35.7)
MCV: 91 fL (ref 79–97)
Monocytes Absolute: 0.6 10*3/uL (ref 0.1–0.9)
Monocytes: 10 %
Neutrophils Absolute: 3.1 10*3/uL (ref 1.4–7.0)
Neutrophils: 54 %
Platelets: 195 10*3/uL (ref 150–450)
RBC: 5.85 x10E6/uL — ABNORMAL HIGH (ref 4.14–5.80)
RDW: 13.2 % (ref 11.6–15.4)
WBC: 5.7 10*3/uL (ref 3.4–10.8)

## 2022-03-04 LAB — CMP14+EGFR
ALT: 15 IU/L (ref 0–44)
AST: 14 IU/L (ref 0–40)
Albumin/Globulin Ratio: 2.2 (ref 1.2–2.2)
Albumin: 4.8 g/dL — ABNORMAL HIGH (ref 3.7–4.7)
Alkaline Phosphatase: 65 IU/L (ref 44–121)
BUN/Creatinine Ratio: 11 (ref 10–24)
BUN: 10 mg/dL (ref 8–27)
Bilirubin Total: 1.4 mg/dL — ABNORMAL HIGH (ref 0.0–1.2)
CO2: 24 mmol/L (ref 20–29)
Calcium: 10 mg/dL (ref 8.6–10.2)
Chloride: 102 mmol/L (ref 96–106)
Creatinine, Ser: 0.95 mg/dL (ref 0.76–1.27)
Globulin, Total: 2.2 g/dL (ref 1.5–4.5)
Glucose: 162 mg/dL — ABNORMAL HIGH (ref 70–99)
Potassium: 4.8 mmol/L (ref 3.5–5.2)
Sodium: 141 mmol/L (ref 134–144)
Total Protein: 7 g/dL (ref 6.0–8.5)
eGFR: 84 mL/min/{1.73_m2} (ref >59)

## 2022-03-04 LAB — TSH+FREE T4
Free T4: 1.32 ng/dL (ref 0.82–1.77)
TSH: 4.89 u[IU]/mL — ABNORMAL HIGH (ref 0.450–4.500)

## 2022-03-04 LAB — MICROALBUMIN / CREATININE URINE RATIO
Creatinine, Urine: 88 mg/dL
Microalb/Creat Ratio: 10 mg/g creat (ref 0–29)
Microalbumin, Urine: 8.6 ug/mL

## 2022-03-04 LAB — LIPID PANEL
Chol/HDL Ratio: 4.9 ratio (ref 0.0–5.0)
Cholesterol, Total: 138 mg/dL (ref 100–199)
HDL: 28 mg/dL — ABNORMAL LOW (ref >39)
LDL Chol Calc (NIH): 66 mg/dL (ref 0–99)
Triglycerides: 274 mg/dL — ABNORMAL HIGH (ref 0–149)
VLDL Cholesterol Cal: 44 mg/dL — ABNORMAL HIGH (ref 5–40)

## 2022-03-05 ENCOUNTER — Other Ambulatory Visit: Payer: Self-pay | Admitting: Family Medicine

## 2022-03-05 MED ORDER — LEVOTHYROXINE SODIUM 50 MCG PO TABS: 50.0000 ug | ORAL_TABLET | Freq: Every day | ORAL | 0 refills | Status: DC

## 2022-03-08 ENCOUNTER — Encounter: Payer: Self-pay | Admitting: Internal Medicine

## 2022-03-08 ENCOUNTER — Ambulatory Visit: Payer: PPO | Admitting: Internal Medicine

## 2022-03-08 VITALS — BP 92/66 | HR 100 | Ht 68.0 in | Wt 158.0 lb

## 2022-03-08 DIAGNOSIS — K222 Esophageal obstruction: Secondary | ICD-10-CM | POA: Diagnosis not present

## 2022-03-08 DIAGNOSIS — K219 Gastro-esophageal reflux disease without esophagitis: Secondary | ICD-10-CM | POA: Diagnosis not present

## 2022-03-08 DIAGNOSIS — R1310 Dysphagia, unspecified: Secondary | ICD-10-CM | POA: Diagnosis not present

## 2022-03-08 DIAGNOSIS — R066 Hiccough: Secondary | ICD-10-CM

## 2022-03-08 MED ORDER — PANTOPRAZOLE SODIUM 40 MG PO TBEC: 40.0000 mg | DELAYED_RELEASE_TABLET | Freq: Every day | ORAL | 3 refills | Status: DC

## 2022-03-08 NOTE — Progress Notes (Signed)
HISTORY OF PRESENT ILLNESS: ? ?Billy Nolan is a 76 y.o. male with past medical history as listed below who was evaluated in the office by the GI nurse practitioner December 23, 2021 regarding dysphagia.  He subsequently underwent upper endoscopy January 19, 2022.  He was found to have erosive esophagitis and esophageal stricturing.  The esophagus was dilated to 18 mm.  He was placed on pantoprazole 40 mg daily.  Follow-up at this time recommended.  Patient also underwent routine screening colonoscopy in October 2015.  The examination revealed left-sided diverticulosis and a 9 adenomatous colon polyp.  Follow-up in 10 years recommended. ? ?The patient presents today for follow-up.  He is accompanied by his wife.  He tells me that he has no further reflux symptoms on pantoprazole.  His dysphagia has resolved post dilation.  No appreciable medication side effects.  They do tell me that he has had hiccups for 2 years after being placed on Ozempic.  He was told by one of his physicians that esophagus problems can cause hiccups.  They were told to "ask me about this".  Since being on PPI does feel that his hiccups may be less problematic.  They do not sound severe.  They seem to bother his wife more than the patient.  No new complaints. ? ?REVIEW OF SYSTEMS: ? ?All non-GI ROS negative unless otherwise stated in the HPI. ?Past Medical History:  ?Diagnosis Date  ? Allergy   ? Diabetes mellitus without complication ([place])   ? GERD (gastroesophageal reflux disease)   ? Hyperlipidemia   ? Hypertension   ? Thyroid disease 2023  ? hypothyroidism  ? ? ?Past Surgical History:  ?Procedure Laterality Date  ? ANTERIOR LATERAL LUMBAR FUSION WITH PERCUTANEOUS SCREW 1 LEVEL N/A 06/17/2021  ? Procedure: ANTERIOR LATERAL LUMBAR FUSION WITH PERCUTANEOUS SCREW 1 LEVEL (XLIF with PSFI) L3-4;  Surgeon: Melina Schools, MD;  Location: [facility];  Service: Orthopedics;  Laterality: N/A;  5 hrs ?Left tap block with exparel ?3C bed post op  ?  COLONOSCOPY    ? dental surgeries    ? teeth extractions, root cannal  ? LUMBAR LAMINECTOMY/DECOMPRESSION MICRODISCECTOMY Left 01/30/2016  ? Procedure: LUMBAR LAMINECTOMY/CENTRAL DECOMPRESSION MICRODISCECTOMY L3 - L4 ON THE LEFT;  Surgeon: Latanya Maudlin, MD;  Location: [facility];  Service: Orthopedics;  Laterality: Left;  ? ? ?Social History ?Billy Nolan  reports that he has never smoked. He has never used smokeless tobacco. He reports that he does not drink alcohol and does not use drugs. ? ?family history includes Cancer in his sister; Heart disease in his father and mother; Multiple sclerosis in his sister; Stroke in his father; Ulcerative colitis in his son. ? ?Allergies  ?Allergen Reactions  ? Sulfa Antibiotics Rash  ? ? ?  ? ?PHYSICAL EXAMINATION: ?Vital signs: BP 92/66   Pulse 100   Ht '5\' 8"'$  (1.727 m)   Wt 158 lb (71.7 kg)   SpO2 97%   BMI 24.02 kg/m?   ?Constitutional: generally well-appearing, no acute distress ?Psychiatric: alert and oriented x3, cooperative ?Eyes: extraocular movements intact, anicteric, conjunctiva pink ?Mouth: oral pharynx moist, no lesions ?Neck: supple no lymphadenopathy ?Cardiovascular: heart regular rate and rhythm, no murmur ?Lungs: clear to auscultation bilaterally ?Abdomen: soft, nontender, nondistended, no obvious ascites, no peritoneal signs, normal bowel sounds, no organomegaly ?Rectal: Omitted ?Extremities: no clubbing, cyanosis, or lower extremity edema bilaterally ?Skin: no lesions on visible extremities ?Neuro: No focal deficits.  Cranial nerves intact ? ?ASSESSMENT: ? ?1.  GERD complicated  by erosive esophagitis and symptomatic peptic stricture.  Asymptomatic post dilation on PPI ?2.  Screening colonoscopy 2015 with diverticulosis ?3.  Chronic hiccups.  Discussed ? ? ?PLAN: ? ?1.  Reflux precautions ?2.  Continue pantoprazole 40 mg daily.  Prescription refilled.  Medication risk reviewed ?3.  Routine office follow-up 1 year.  Contact the office in the interim for  questions or problems with recurrent dysphagia. ?4.  Routine follow-up screening colonoscopy around October 2025 ? ? ? ? ?  ?

## 2022-03-08 NOTE — Patient Instructions (Addendum)
If you are age 74 or older, your body mass index should be between 23-30. Your Body mass index is 24.02 kg/m?Marland Kitchen If this is out of the aforementioned range listed, please consider follow up with your Primary Care Provider. ? ?If you are age 71 or younger, your body mass index should be between 19-25. Your Body mass index is 24.02 kg/m?Marland Kitchen If this is out of the aformentioned range listed, please consider follow up with your Primary Care Provider.  ? ?________________________________________________________ ? ?The [facility] providers would like to encourage you to use [facility] to communicate with providers for non-urgent requests or questions.  Due to long hold times on the telephone, sending your provider a message by [facility] may be a faster and more efficient way to get a response.  Please allow 48 business hours for a response.  Please remember that this is for non-urgent requests.  ?_______________________________________________________ ? ?We have sent the following medications to your pharmacy for you to pick up at your convenience:  Pantoprazole ? ?Please follow up in one year ? ?

## 2022-03-09 ENCOUNTER — Telehealth: Payer: Self-pay | Admitting: Family Medicine

## 2022-03-09 NOTE — Telephone Encounter (Signed)
He needs to take both. ?

## 2022-03-09 NOTE — Telephone Encounter (Signed)
Patients wife aware

## 2022-03-17 NOTE — Progress Notes (Signed)
? ? ?Chronic Care Management ?Pharmacy Note ? ?03/04/2022 ?Name:  Billy Nolan MRN:  [id number] DOB:  1946/11/11 ? ?Summary: ?Diabetes: Goal on Track (progressing): YES. ?Uncontrolled-improved (A1C 8%-->7.5%); current treatment: Ozempic 0.45m weekly METFORMIN, Farxiga;  ?Past meds: Rybelsus (d/c'd d/t gerd/hiatal hernia issues), metformin ?Approved for [name] [place](shipment arrived today #5 march) ?Education and demo provided ?Denies personal and family history of Medullary thyroid cancer (MTC) ?CONTINUE FARXIGA 10MG DAILY ?BETTER CKD, CV & T2DM DATA ?APPROVED FOR [place] PATIENT ASSISTANCE ?Denies hypoglycemic/hyperglycemic symptoms ?Discussed meal planning options and Plate method for healthy eating ?Avoid sugary drinks and desserts ?Incorporate balanced protein, non starchy veggies, 1 serving of carbohydrate with each meal ?Increase water intake ?Increase physical activity as able ?DISCUSSED blood pressure medications, at goal ?Current exercise: n/a ?Collaborated with PCP to change GLP1, SGLT2; medication list updated ?Assessed patient finances. [name] FOR FARXIGA ? ?Subjective: ?Billy Gilreathis an 76y.o. year old male who is a primary patient of Stacks, WCletus Gash MD.  The CCM team was consulted for assistance with disease management and care coordination needs.   ? ?Engaged with patient by telephone for follow up visit in response to provider referral for pharmacy case management and/or care coordination services.  ? ?Consent to Services:  ?The patient was given information about Chronic Care Management services, agreed to services, and gave verbal consent prior to initiation of services.  Please see initial visit note for detailed documentation.  ? ?Patient Care Team: ?SClaretta Fraise MD as PCP - General (Family Medicine) ?[facility](Pharmacist) ? ?Objective: ? ?Lab Results  ?Component Value Date  ? CREATININE 0.95 03/03/2022   ? CREATININE 0.94 12/02/2021  ? CREATININE 0.84 09/03/2021  ? ? ?Lab Results  ?Component Value Date  ? HGBA1C 7.5 (H) 03/03/2022  ? ?Last diabetic Eye exam:  ?Lab Results  ?Component Value Date/Time  ? HMDIABEYEEXA No Retinopathy 10/21/2020 12:00 AM  ?  ?Last diabetic Foot exam: No results found for: HMDIABFOOTEX  ? ?   ?Component Value Date/Time  ? CHOL 138 03/03/2022 0837  ? TRIG 274 (H) 03/03/2022 0837  ? TRIG 125 10/24/2013 0930  ? HDL 28 (L) 03/03/2022 0837  ? HDL 35 (L) 10/24/2013 0930  ? CHOLHDL 4.9 03/03/2022 0837  ? LL[place]66 03/03/2022 0837  ? LDLCALC 84 10/24/2013 0930  ? ? ? ?  Latest Ref Rng & Units 03/03/2022  ?  8:37 AM 12/02/2021  ?  8:19 AM 09/03/2021  ?  3:21 PM  ?Hepatic Function  ?Total Protein 6.0 - 8.5 g/dL 7.0   6.9   6.8    ?Albumin 3.7 - 4.7 g/dL 4.8   4.7   4.7    ?AST 0 - 40 IU/L 14   14   16     ?ALT 0 - 44 IU/L 15   13   16     ?Alk Phosphatase 44 - 121 IU/L 65   66   73    ?Total Bilirubin 0.0 - 1.2 mg/dL 1.4   1.8   1.5    ? ? ?Lab Results  ?Component Value Date/Time  ? TSH 4.890 (H) 03/03/2022 08:37 AM  ? TSH 4.490 12/02/2021 08:19 AM  ? FREET4 1.32 03/03/2022 08:37 AM  ? FREET4 1.23 12/02/2021 08:19 AM  ? ? ? ?  Latest Ref Rng & Units 03/03/2022  ?  8:37 AM 12/02/2021  ?  8:19 AM 09/03/2021  ?  3:21 PM  ?  CBC  ?WBC 3.4 - 10.8 x10E3/uL 5.7   6.2   6.4    ?Hemoglobin 13.0 - 17.7 g/dL 18.1   17.2   16.2    ?Hematocrit 37.5 - 51.0 % 53.0   51.3   45.8    ?Platelets 150 - 450 x10E3/uL 195   228   233    ? ? ?No results found for: VD25OH ? ?Clinical ASCVD: No  ?The 10-year ASCVD risk score (Arnett DK, et al., 2019) is: 30.1% ?  Values used to calculate the score: ?    Age: 44 years ?    Sex: Male ?    Is Non-Hispanic African American: No ?    Diabetic: Yes ?    Tobacco smoker: No ?    Systolic Blood Pressure: 92 mmHg ?    Is BP treated: Yes ?    HDL Cholesterol: 28 mg/dL ?    Total Cholesterol: 138 mg/dL   ? ?Other: (CHADS2VASc if Afib, PHQ9 if depression, MMRC or CAT for COPD, ACT,  DEXA) ? ?Social History  ? ?Tobacco Use  ?Smoking Status Never  ?Smokeless Tobacco Never  ? ?BP Readings from Last 3 Encounters:  ?03/08/22 92/66  ?01/19/22 114/68  ?12/23/21 118/76  ? ?Pulse Readings from Last 3 Encounters:  ?03/08/22 100  ?01/19/22 79  ?12/23/21 95  ? ?Wt Readings from Last 3 Encounters:  ?03/08/22 158 lb (71.7 kg)  ?01/19/22 157 lb (71.2 kg)  ?12/23/21 157 lb 8 oz (71.4 kg)  ? ? ?Assessment: Review of patient past medical history, allergies, medications, health status, including review of consultants reports, laboratory and other test data, was performed as part of comprehensive evaluation and provision of chronic care management services.  ? ?SDOH:  (Social Determinants of Health) assessments and interventions performed:  ? ? ?CCM Care Plan ? ?Allergies  ?Allergen Reactions  ? Sulfa Antibiotics Rash  ? ? ?Medications Reviewed Today   ? ? Reviewed by Lavera Guise, [facility] (Pharmacist) on 03/18/22 at [phone]  Med List Status: <None>  ? ?Medication Order Taking? Sig Documenting Provider Last Dose Status Informant  ?Continuous Blood Gluc Sensor (FREESTYLE LIBRE 2 SENSOR) MISC [id number] No USE TO CHECK BLOOD SUGAR SIX TIMES A Effie Shy, MD Taking Active   ?dapagliflozin propanediol (FARXIGA) 10 MG TABS tablet [id number] No Take 1 tablet (10 mg total) by mouth daily before breakfast. [name], MD Taking Active   ?         ?Med Note (Horice Carrero D   Wed Feb 10, 2022 10:14 AM) Via [place] patient assistance program ?  ?famotidine (PEPCID) 20 MG tablet [id number] No Take 1 tablet (20 mg total) by mouth daily. For reflux / heartburn  ?Patient taking differently: Take 20 mg by mouth daily as needed. For reflux / heartburn  ? [name], MD Taking Active   ?levothyroxine (SYNTHROID) 50 MCG tablet [id number] No Take 1 tablet (50 mcg total) by mouth daily. [name], MD Taking Active   ?lisinopril (ZESTRIL) 10 MG tablet [id number] No Take 1 tablet (10 mg total) by mouth daily. [name],  MD Taking Active   ?pantoprazole (PROTONIX) 40 MG tablet [id number]  Take 1 tablet (40 mg total) by mouth daily. Irene Shipper, MD  Active   ?rosuvastatin (CRESTOR) 20 MG tablet [id number] No Take 1 tablet (20 mg total) by mouth daily. [name], MD Taking Active   ?Semaglutide,0.25 or 0.5MG/DOS, (OZEMPIC, 0.25 OR 0.5 MG/DOSE,) 2 MG/1.5ML SOPN [id number] No Inject 0.5 mg  into the skin once a week. [name], MD Taking Active   ?         ?Med Note (Daylan Boggess D   Wed Feb 10, 2022 10:14 AM) Via [employer]isk patient assistance  ? ?  ?  ? ?  ? ? ?Patient Active Problem List  ? Diagnosis Date Noted  ? Fusion of lumbar spine 06/17/2021  ? Spinal stenosis, lumbar region, with neurogenic claudication 01/30/2016  ? DM (diabetes mellitus) ([place]) 10/24/2013  ? Hyperlipidemia 10/24/2013  ? ? ?Immunization History  ?Administered Date(s) Administered  ? Influenza, High Dose Seasonal PF 11/01/2018  ? Moderna Sars-Covid-2 Vaccination 12/27/2019, 02/01/2020  ? Pneumococcal Conjugate-13 07/03/2014  ? Pneumococcal Polysaccharide-23 03/03/2018  ? Tdap 03/03/2022  ? Zoster Recombinat (Shingrix) 03/03/2022  ? ? ?Conditions to be addressed/monitored: ?HLD and DMII ? ?Care Plan : PHARMD MEDICATION MANAGEMENT  ?Updates made by Lavera Guise, RPH since 03/18/2022 12:00 AM  ?  ? ?Problem: DISEASE PROGRESSION PREVENTION   ?  ? ?Long-Range Goal: T2DM   ?Recent Progress: Not on track  ?Priority: High  ?Note:   ?Current Barriers:  ?Unable to independently afford treatment regimen ?Unable to achieve control of T2DM  ?Suboptimal therapeutic regimen for T2DM ? ?Pharmacist Clinical Goal(s):  ?patient will verbalize ability to afford treatment regimen ?achieve control of T2DM  as evidenced by GOAL A1C<7% ?maintain control of T2DM as evidenced by GOAL A1C<7%  through collaboration with PharmD and provider.  ? ? ?Interventions: ?1:1 collaboration with [name], MD regarding development and update of comprehensive plan of care as evidenced  by provider attestation and co-signature ?Inter-disciplinary care team collaboration (see longitudinal plan of care) ?Comprehensive medication review performed; medication list updated in electronic medical recor

## 2022-03-18 NOTE — Patient Instructions (Signed)
Visit Information ? ?Following are the goals we discussed today:  ?Current Barriers:  ?Unable to independently afford treatment regimen ?Unable to achieve control of T2DM  ?Suboptimal therapeutic regimen for T2DM ? ?Pharmacist Clinical Goal(s):  ?patient will verbalize ability to afford treatment regimen ?achieve control of T2DM  as evidenced by GOAL A1C<7% ?maintain control of T2DM as evidenced by GOAL A1C<7%  through collaboration with PharmD and provider.  ? ? ?Interventions: ?1:1 collaboration with [name], MD regarding development and update of comprehensive plan of care as evidenced by provider attestation and co-signature ?Inter-disciplinary care team collaboration (see longitudinal plan of care) ?Comprehensive medication review performed; medication list updated in electronic medical record ? ?Diabetes: Goal on Track (progressing): YES. ?Uncontrolled-improved (A1C 8%-->7.5%); current treatment: Ozempic 0.'5mg'$  weekly METFORMIN, Farxiga;  ?Past meds: Rybelsus (d/c'd d/t gerd/hiatal hernia issues), metformin ?Approved for [name] [place] (shipment arrived today #5 march) ?Education and demo provided ?Denies personal and family history of Medullary thyroid cancer (MTC) ?CONTINUE FARXIGA '10MG'$  DAILY ?BETTER CKD, CV & T2DM DATA ?APPROVED FOR [place]&ME PATIENT ASSISTANCE ?Denies hypoglycemic/hyperglycemic symptoms ?Discussed meal planning options and Plate method for healthy eating ?Avoid sugary drinks and desserts ?Incorporate balanced protein, non starchy veggies, 1 serving of carbohydrate with each meal ?Increase water intake ?Increase physical activity as able ?DISCUSSED blood pressure medications, at goal ?Current exercise: n/a ?Collaborated with PCP to change GLP1, SGLT2; medication list updated ?Assessed patient finances. ENROLLED IN [name] FOR [place] AND [place] PATIENT ASSISTANCE FOR FARXIGA ? ? ?Patient Goals/Self-Care Activities ?patient will:  ?- take medications as prescribed as  evidenced by patient report and record review ?check glucose DAILY OR IF SYMPTOMATIC, document, and provide at future appointments ?collaborate with provider on medication access solutions ? ? ?Plan: Telephone follow up appointment with care management team member scheduled for:  3 months ? ?Signature ?[name], PharmD, BCPS ?Clinical Pharmacist, [facility] ?[place]  II Phone [phone] ? ? ?Please call the care guide team at [phone] if you need to cancel or reschedule your appointment.  ? ?Patient verbalizes understanding of instructions and care plan provided today and agrees to view in [place]. Active MyChart status confirmed with patient.   ? ?

## 2022-03-30 ENCOUNTER — Telehealth: Payer: Self-pay | Admitting: Pharmacist

## 2022-03-30 ENCOUNTER — Other Ambulatory Visit: Payer: Self-pay | Admitting: Family Medicine

## 2022-03-30 NOTE — Telephone Encounter (Signed)
Please let patient know ozempic #5 boxes are in fridge for pick up ? ?Thank you! ?

## 2022-03-30 NOTE — Telephone Encounter (Signed)
Patient aware.

## 2022-03-31 ENCOUNTER — Telehealth: Payer: Self-pay | Admitting: Neurology

## 2022-03-31 NOTE — Telephone Encounter (Signed)
Called pt to reschedule 6/7 appointment (MD out of office), pt stated he would call us back when he got home to reschedule. ?

## 2022-05-12 ENCOUNTER — Ambulatory Visit: Payer: PPO | Admitting: Neurology

## 2022-05-13 ENCOUNTER — Telehealth: Payer: Self-pay | Admitting: Emergency Medicine

## 2022-05-13 NOTE — Telephone Encounter (Signed)
Patient aware.

## 2022-05-13 NOTE — Telephone Encounter (Signed)
Patient went to Oral Surgeon and they checked his blood pressure. His blood pressure was 88/54, pulse 101. Patient has had spells of being dizzy and wants to know can he stop the Lisinopril?

## 2022-05-13 NOTE — Telephone Encounter (Signed)
Okay to DC lisinopril

## 2022-05-28 ENCOUNTER — Ambulatory Visit (INDEPENDENT_AMBULATORY_CARE_PROVIDER_SITE_OTHER): Payer: PPO

## 2022-05-28 VITALS — Wt 151.0 lb

## 2022-05-28 DIAGNOSIS — Z0000 Encounter for general adult medical examination without abnormal findings: Secondary | ICD-10-CM

## 2022-05-29 ENCOUNTER — Other Ambulatory Visit: Payer: Self-pay | Admitting: Family Medicine

## 2022-06-10 ENCOUNTER — Ambulatory Visit: Payer: PPO | Admitting: Family Medicine

## 2022-06-14 ENCOUNTER — Ambulatory Visit (INDEPENDENT_AMBULATORY_CARE_PROVIDER_SITE_OTHER): Payer: PPO | Admitting: Family Medicine

## 2022-06-14 ENCOUNTER — Encounter: Payer: Self-pay | Admitting: Family Medicine

## 2022-06-14 VITALS — BP 131/82 | HR 84 | Temp 97.9°F | Ht 68.0 in | Wt 153.2 lb

## 2022-06-14 DIAGNOSIS — R7989 Other specified abnormal findings of blood chemistry: Secondary | ICD-10-CM | POA: Diagnosis not present

## 2022-06-14 DIAGNOSIS — E785 Hyperlipidemia, unspecified: Secondary | ICD-10-CM | POA: Diagnosis not present

## 2022-06-14 DIAGNOSIS — E1141 Type 2 diabetes mellitus with diabetic mononeuropathy: Secondary | ICD-10-CM

## 2022-06-14 LAB — BAYER DCA HB A1C WAIVED: HB A1C (BAYER DCA - WAIVED): 6.6 % — ABNORMAL HIGH (ref 4.8–5.6)

## 2022-06-14 NOTE — Progress Notes (Signed)
Subjective:  Patient ID: Billy Nolan, male    DOB: 1946/05/16  Age: 76 y.o. MRN: [id number]  CC: Medical Management of Chronic Issues   HPI Billy Nolan presents forFollow-up of diabetes. Patient checks blood sugar at home.   130-150 fasting and 170 up to 291 postprandial Had oral surgery , implants on 7/7 Patient denies symptoms such as polyuria, polydipsia, excessive hunger, nausea No significant hypoglycemic spells noted. Medications reviewed. Pt reports taking them regularly without complication/adverse reaction being reported today.    follow-up on  thyroid. The patient has a history of hypothyroidism for many years. It has been stable recently. Pt. denies any change in  voice, loss of hair, heat or cold intolerance. Energy level has been adequate to good. Patient denies constipation and diarrhea. No myxedema. Medication is as noted below. Verified that pt is taking it daily on an empty stomach. Well tolerated.    History Billy Nolan has a past medical history of Allergy, Diabetes mellitus without complication ([place]), GERD (gastroesophageal reflux disease), Hyperlipidemia, Hypertension, and Thyroid disease (2023).   He has a past surgical history that includes dental surgeries; Lumbar laminectomy/decompression microdiscectomy (Left, 01/30/2016); Anterior lateral lumbar fusion with percutaneous screw 1 level (N/A, 06/17/2021); and Colonoscopy.   His family history includes Cancer in his sister; Heart disease in his father and mother; Multiple sclerosis in his sister; Stroke in his father; Ulcerative colitis in his son.He reports that he has never smoked. He has never used smokeless tobacco. He reports that he does not drink alcohol and does not use drugs.  Current Outpatient Medications on File Prior to Visit  Medication Sig Dispense Refill   Continuous Blood Gluc Sensor (FREESTYLE LIBRE 2 SENSOR) MISC USE TO CHECK BLOOD SUGAR 6 TIMES DAILY 2 each 2   dapagliflozin propanediol (FARXIGA) 10  MG TABS tablet Take 1 tablet (10 mg total) by mouth daily before breakfast. 90 tablet 3   famotidine (PEPCID) 20 MG tablet Take 1 tablet (20 mg total) by mouth daily. For reflux / heartburn (Patient taking differently: Take 20 mg by mouth daily as needed. For reflux / heartburn) 90 tablet 3   levothyroxine (SYNTHROID) 50 MCG tablet Take 1 tablet by mouth once daily 90 tablet 0   pantoprazole (PROTONIX) 40 MG tablet Take 1 tablet (40 mg total) by mouth daily. 90 tablet 3   rosuvastatin (CRESTOR) 20 MG tablet Take 1 tablet (20 mg total) by mouth daily. 90 tablet 3   Semaglutide,0.25 or 0.5MG/DOS, (OZEMPIC, 0.25 OR 0.5 MG/DOSE,) 2 MG/1.5ML SOPN Inject 0.5 mg into the skin once a week. 1.5 mL 2   No current facility-administered medications on file prior to visit.    ROS Review of Systems  Constitutional:  Negative for fever.  Respiratory:  Negative for shortness of breath.   Cardiovascular:  Negative for chest pain.  Musculoskeletal:  Negative for arthralgias.  Skin:  Negative for rash.    Objective:  BP 131/82   Pulse 84   Temp 97.9 F (36.6 C)   Ht _0  (1.727 m)   Wt 153 lb 3.2 oz (69.5 kg)   SpO2 96%   BMI 23.29 kg/m   BP Readings from Last 3 Encounters:  06/14/22 131/82  03/08/22 92/66  01/19/22 114/68    Wt Readings from Last 3 Encounters:  06/14/22 153 lb 3.2 oz (69.5 kg)  05/28/22 151 lb (68.5 kg)  03/08/22 158 lb (71.7 kg)     Physical Exam Vitals reviewed.  Constitutional:  Appearance: He is well-developed.  HENT:     Head: Normocephalic and atraumatic.     Right Ear: External ear normal.     Left Ear: External ear normal.     Mouth/Throat:     Pharynx: No oropharyngeal exudate or posterior oropharyngeal erythema.  Eyes:     Pupils: Pupils are equal, round, and reactive to light.  Cardiovascular:     Rate and Rhythm: Normal rate and regular rhythm.     Heart sounds: No murmur heard. Pulmonary:     Effort: No respiratory distress.     Breath  sounds: Normal breath sounds.  Musculoskeletal:     Cervical back: Normal range of motion and neck supple.  Neurological:     Mental Status: He is alert and oriented to person, place, and time.       Assessment & Plan:   Billy Nolan was seen today for medical management of chronic issues.  Diagnoses and all orders for this visit:  Type 2 diabetes mellitus with diabetic mononeuropathy, without long-term current use of insulin (HCC) -     Bayer DCA Hb A1c Waived -     CBC with Differential/Platelet -     CMP14+EGFR  Hyperlipidemia, unspecified hyperlipidemia type -     Lipid panel  Abnormal TSH -     TSH + free T4      I have discontinued Ulas Mckibbin's lisinopril. I am also having him maintain his dapagliflozin propanediol, Ozempic (0.25 or 0.5 MG/DOSE), famotidine, rosuvastatin, pantoprazole, FreeStyle Libre 2 Sensor, and levothyroxine.  No orders of the defined types were placed in this encounter.    Follow-up: Return in about 3 months (around 09/14/2022).  [name], M.D.

## 2022-06-15 LAB — CBC WITH DIFFERENTIAL/PLATELET
Basophils Absolute: 0 10*3/uL (ref 0.0–0.2)
Basos: 1 %
EOS (ABSOLUTE): 0.2 10*3/uL (ref 0.0–0.4)
Eos: 3 %
Hematocrit: 51.3 % — ABNORMAL HIGH (ref 37.5–51.0)
Hemoglobin: 17.6 g/dL (ref 13.0–17.7)
Immature Grans (Abs): 0 10*3/uL (ref 0.0–0.1)
Immature Granulocytes: 0 %
Lymphocytes Absolute: 1.7 10*3/uL (ref 0.7–3.1)
Lymphs: 29 %
MCH: 30.5 pg (ref 26.6–33.0)
MCHC: 34.3 g/dL (ref 31.5–35.7)
MCV: 89 fL (ref 79–97)
Monocytes Absolute: 0.4 10*3/uL (ref 0.1–0.9)
Monocytes: 7 %
Neutrophils Absolute: 3.5 10*3/uL (ref 1.4–7.0)
Neutrophils: 60 %
Platelets: 220 10*3/uL (ref 150–450)
RBC: 5.77 x10E6/uL (ref 4.14–5.80)
RDW: 11.9 % (ref 11.6–15.4)
WBC: 5.8 10*3/uL (ref 3.4–10.8)

## 2022-06-15 LAB — TSH+FREE T4
Free T4: 1.94 ng/dL — ABNORMAL HIGH (ref 0.82–1.77)
TSH: 0.014 u[IU]/mL — ABNORMAL LOW (ref 0.450–4.500)

## 2022-06-15 LAB — CMP14+EGFR
ALT: 12 IU/L (ref 0–44)
AST: 11 IU/L (ref 0–40)
Albumin/Globulin Ratio: 1.9 (ref 1.2–2.2)
Albumin: 4.5 g/dL (ref 3.8–4.8)
Alkaline Phosphatase: 60 IU/L (ref 44–121)
BUN/Creatinine Ratio: 14 (ref 10–24)
BUN: 11 mg/dL (ref 8–27)
Bilirubin Total: 1.7 mg/dL — ABNORMAL HIGH (ref 0.0–1.2)
CO2: 24 mmol/L (ref 20–29)
Calcium: 10.3 mg/dL — ABNORMAL HIGH (ref 8.6–10.2)
Chloride: 100 mmol/L (ref 96–106)
Creatinine, Ser: 0.81 mg/dL (ref 0.76–1.27)
Globulin, Total: 2.4 g/dL (ref 1.5–4.5)
Glucose: 140 mg/dL — ABNORMAL HIGH (ref 70–99)
Potassium: 5 mmol/L (ref 3.5–5.2)
Sodium: 138 mmol/L (ref 134–144)
Total Protein: 6.9 g/dL (ref 6.0–8.5)
eGFR: 93 mL/min/{1.73_m2} (ref >59)

## 2022-06-15 LAB — LIPID PANEL
Chol/HDL Ratio: 4.6 ratio (ref 0.0–5.0)
Cholesterol, Total: 132 mg/dL (ref 100–199)
HDL: 29 mg/dL — ABNORMAL LOW (ref >39)
LDL Chol Calc (NIH): 69 mg/dL (ref 0–99)
Triglycerides: 204 mg/dL — ABNORMAL HIGH (ref 0–149)
VLDL Cholesterol Cal: 34 mg/dL (ref 5–40)

## 2022-06-15 NOTE — Progress Notes (Signed)
Hello Pat,  Your lab result is normal and/or stable.Some minor variations that are not significant are commonly marked abnormal, but do not represent any medical problem for you.  Best regards, [name], M.D.

## 2022-06-20 ENCOUNTER — Other Ambulatory Visit: Payer: Self-pay | Admitting: Family Medicine

## 2022-06-28 ENCOUNTER — Encounter: Payer: Self-pay | Admitting: Internal Medicine

## 2022-08-23 ENCOUNTER — Other Ambulatory Visit: Payer: Self-pay | Admitting: Family Medicine

## 2022-08-28 ENCOUNTER — Other Ambulatory Visit: Payer: Self-pay | Admitting: Family Medicine

## 2022-09-02 ENCOUNTER — Telehealth: Payer: Self-pay | Admitting: Family Medicine

## 2022-09-06 ENCOUNTER — Other Ambulatory Visit: Payer: Self-pay | Admitting: Family Medicine

## 2022-09-06 DIAGNOSIS — E1141 Type 2 diabetes mellitus with diabetic mononeuropathy: Secondary | ICD-10-CM

## 2022-09-06 DIAGNOSIS — M48062 Spinal stenosis, lumbar region with neurogenic claudication: Secondary | ICD-10-CM

## 2022-09-06 DIAGNOSIS — E785 Hyperlipidemia, unspecified: Secondary | ICD-10-CM

## 2022-09-10 NOTE — Addendum Note (Signed)
Addended by: Lottie Dawson D on: 09/10/2022 09:42 AM   Modules accepted: Orders

## 2022-09-13 ENCOUNTER — Other Ambulatory Visit: Payer: Self-pay | Admitting: Family Medicine

## 2022-09-13 ENCOUNTER — Telehealth: Payer: Self-pay

## 2022-09-13 ENCOUNTER — Telehealth: Payer: Self-pay | Admitting: *Deleted

## 2022-09-13 DIAGNOSIS — E1141 Type 2 diabetes mellitus with diabetic mononeuropathy: Secondary | ICD-10-CM

## 2022-09-13 NOTE — Chronic Care Management (AMB) (Unsigned)
  Chronic Care Management   Outreach Note  09/13/2022 Name: Billy Nolan MRN: [id number] DOB: [birth year]  Billy Nolan is a 76 y.o. year old male who is a primary care patient of Stacks, Cletus Gash, MD. I reached out to [name] by phone today in response to a referral sent by [name]'s primary care provider.  An unsuccessful telephone outreach was attempted today. The patient was referred to the case management team for assistance with care management and care coordination.   Follow Up Plan: A HIPAA compliant phone message was left for the patient providing contact information and requesting a return call.  The care management team will reach out to the patient again over the next 7 days.  If patient returns call to provider office, please advise to call Gordon * at [phone]*  Noreene Larsson, [place], [place] [address] Direct Dial: [phone] [name]

## 2022-09-15 ENCOUNTER — Encounter: Payer: Self-pay | Admitting: Family Medicine

## 2022-09-15 ENCOUNTER — Telehealth: Payer: Self-pay | Admitting: Family Medicine

## 2022-09-15 ENCOUNTER — Ambulatory Visit (INDEPENDENT_AMBULATORY_CARE_PROVIDER_SITE_OTHER): Payer: PPO | Admitting: Family Medicine

## 2022-09-15 VITALS — BP 114/73 | HR 82 | Temp 97.5°F | Ht 68.0 in | Wt 154.6 lb

## 2022-09-15 DIAGNOSIS — E039 Hypothyroidism, unspecified: Secondary | ICD-10-CM | POA: Diagnosis not present

## 2022-09-15 DIAGNOSIS — E785 Hyperlipidemia, unspecified: Secondary | ICD-10-CM | POA: Diagnosis not present

## 2022-09-15 DIAGNOSIS — E1141 Type 2 diabetes mellitus with diabetic mononeuropathy: Secondary | ICD-10-CM | POA: Diagnosis not present

## 2022-09-15 LAB — BAYER DCA HB A1C WAIVED: HB A1C (BAYER DCA - WAIVED): 7.3 % — ABNORMAL HIGH (ref 4.8–5.6)

## 2022-09-15 MED ORDER — OZEMPIC (1 MG/DOSE) 4 MG/3ML ~~LOC~~ SOPN: 1.0000 mg | PEN_INJECTOR | SUBCUTANEOUS | 5 refills | Status: DC

## 2022-09-15 MED ORDER — DAPAGLIFLOZIN PROPANEDIOL 10 MG PO TABS: 10.0000 mg | ORAL_TABLET | Freq: Every day | ORAL | 3 refills | Status: DC

## 2022-09-15 MED ORDER — LEVOTHYROXINE SODIUM 50 MCG PO TABS: 50.0000 ug | ORAL_TABLET | Freq: Every day | ORAL | 0 refills | Status: DC

## 2022-09-15 NOTE — Telephone Encounter (Signed)
Returned call for more info, no answer

## 2022-09-15 NOTE — Chronic Care Management (AMB) (Signed)
  Chronic Care Management   Note  09/15/2022 Name: Billy Nolan MRN: [id number] DOB: Jul 06, 1946  Billy Nolan is a 76 y.o. year old male who is a primary care patient of Stacks, Cletus Gash, MD. I reached out to [name] by phone today in response to a referral sent by Billy Nolan PCP.  Mr. Giannini was given information about Chronic Care Management services today including:  CCM service includes personalized support from designated clinical staff supervised by his physician, including individualized plan of care and coordination with other care providers 24/7 contact phone numbers for assistance for urgent and routine care needs. Service will only be billed when office clinical staff spend 20 minutes or more in a month to coordinate care. Only one practitioner may furnish and bill the service in a calendar month. The patient may stop CCM services at any time (effective at the end of the month) by phone call to the office staff. The patient is responsible for co-pay (up to 20% after annual deductible is met) if co-pay is required by the individual health plan.   Patient agreed to services and verbal consent obtained.   Follow up plan: Telephone appointment with care management team member scheduled for:11/16/2022  Noreene Larsson, [place], [place] [address] Direct Dial: [phone] [email or web address]

## 2022-09-15 NOTE — Progress Notes (Signed)
Subjective:  Patient ID: Billy Nolan,  male    DOB: Oct 20, 1946  Age: 76 y.o.    CC: Medical Management of Chronic Issues   HPI Dyllen Menning presents for  follow-up of hypertension. Patient has no history of headache chest pain or shortness of breath or recent cough. Patient also denies symptoms of TIA such as numbness weakness lateralizing. Patient denies side effects from medication. States taking it regularly.   follow-up on  thyroid. The patient has a history of hypothyroidism for many years. It has been stable recently. Pt. denies any change in  voice, loss of hair, heat or cold intolerance. Energy level has been adequate to good. Patient denies constipation and diarrhea. No myxedema. Medication is as noted below. Verified that pt is taking it daily on an empty stomach. Well tolerated.   Patient also  in for follow-up of elevated cholesterol. Doing well without complaints on current medication. Denies side effects  including myalgia and arthralgia and nausea. Also in today for liver function testing. Currently no chest pain, shortness of breath or other cardiovascular related symptoms noted.  Follow-up of diabetes. Patient does check blood sugar at home. Readings run between 140 and 380 Patient denies symptoms such as excessive hunger or urinary frequency, excessive hunger, nausea No significant hypoglycemic spells noted. Medications reviewed. Pt reports taking them regularly. Pt. denies complication/adverse reaction today.    History Dezmon has a past medical history of Allergy, Diabetes mellitus without complication ([place]), GERD (gastroesophageal reflux disease), Hyperlipidemia, Hypertension, and Thyroid disease (2023).   He has a past surgical history that includes dental surgeries; Lumbar laminectomy/decompression microdiscectomy (Left, 01/30/2016); Anterior lateral lumbar fusion with percutaneous screw 1 level (N/A, 06/17/2021); and Colonoscopy.   His family history includes  Cancer in his sister; Heart disease in his father and mother; Multiple sclerosis in his sister; Stroke in his father; Ulcerative colitis in his son.He reports that he has never smoked. He has never used smokeless tobacco. He reports that he does not drink alcohol and does not use drugs.  Current Outpatient Medications on File Prior to Visit  Medication Sig Dispense Refill   Continuous Blood Gluc Sensor (FREESTYLE LIBRE 2 SENSOR) MISC USE TO CHECK BLOOD SUGAR 6 TIMES DAILY 6 each 2   famotidine (PEPCID) 20 MG tablet Take 1 tablet (20 mg total) by mouth daily. For reflux / heartburn (Patient taking differently: Take 20 mg by mouth daily as needed. For reflux / heartburn) 90 tablet 3   pantoprazole (PROTONIX) 40 MG tablet Take 1 tablet (40 mg total) by mouth daily. 90 tablet 3   rosuvastatin (CRESTOR) 20 MG tablet Take 1 tablet (20 mg total) by mouth daily. 90 tablet 3   No current facility-administered medications on file prior to visit.    ROS Review of Systems  Constitutional:  Negative for fever.  Respiratory:  Negative for shortness of breath.   Cardiovascular:  Negative for chest pain.  Musculoskeletal:  Negative for arthralgias.  Skin:  Negative for rash.    Objective:  BP 114/73   Pulse 82   Temp (!) 97.5 F (36.4 C)   Ht $R'5\' 8"'zP$  (1.727 m)   Wt 154 lb 9.6 oz (70.1 kg)   SpO2 96%   BMI 23.51 kg/m   BP Readings from Last 3 Encounters:  09/15/22 114/73  06/14/22 131/82  03/08/22 92/66    Wt Readings from Last 3 Encounters:  09/15/22 154 lb 9.6 oz (70.1 kg)  06/14/22 153 lb 3.2 oz (69.5 kg)  05/28/22 151 lb (68.5 kg)     Physical Exam Vitals reviewed.  Constitutional:      Appearance: He is well-developed.  HENT:     Head: Normocephalic and atraumatic.     Right Ear: External ear normal.     Left Ear: External ear normal.     Mouth/Throat:     Pharynx: No oropharyngeal exudate or posterior oropharyngeal erythema.  Eyes:     Pupils: Pupils are equal, round, and  reactive to light.  Cardiovascular:     Rate and Rhythm: Normal rate and regular rhythm.     Heart sounds: No murmur heard. Pulmonary:     Effort: No respiratory distress.     Breath sounds: Normal breath sounds.  Musculoskeletal:     Cervical back: Normal range of motion and neck supple.  Neurological:     Mental Status: He is alert and oriented to person, place, and time.     Diabetic Foot Exam - Simple   No data filed     Lab Results  Component Value Date   HGBA1C 6.6 (H) 06/14/2022   HGBA1C 7.5 (H) 03/03/2022   HGBA1C 7.5 (H) 12/02/2021    Assessment & Plan:   Trevon was seen today for medical management of chronic issues.  Diagnoses and all orders for this visit:  Type 2 diabetes mellitus with diabetic mononeuropathy, without long-term current use of insulin (HCC) -     Bayer DCA Hb A1c Waived -     CBC with Differential/Platelet -     CMP14+EGFR -     dapagliflozin propanediol (FARXIGA) 10 MG TABS tablet; Take 1 tablet (10 mg total) by mouth daily before breakfast.  Hyperlipidemia, unspecified hyperlipidemia type -     Lipid panel  Hypothyroidism, unspecified type -     TSH + free T4  Other orders -     levothyroxine (SYNTHROID) 50 MCG tablet; Take 1 tablet (50 mcg total) by mouth daily. -     Semaglutide, 1 MG/DOSE, (OZEMPIC, 1 MG/DOSE,) 4 MG/3ML SOPN; Inject 1 mg into the skin once a week.   I have discontinued Coralyn Mark Freedman's Ozempic (0.25 or 0.5 MG/DOSE). I have also changed his levothyroxine. Additionally, I am having him start on Ozempic (1 MG/DOSE). Lastly, I am having him maintain his famotidine, rosuvastatin, pantoprazole, FreeStyle Libre 2 Sensor, and dapagliflozin propanediol.  Meds ordered this encounter  Medications   dapagliflozin propanediol (FARXIGA) 10 MG TABS tablet    Sig: Take 1 tablet (10 mg total) by mouth daily before breakfast.    Dispense:  90 tablet    Refill:  3   levothyroxine (SYNTHROID) 50 MCG tablet    Sig: Take 1 tablet (50  mcg total) by mouth daily.    Dispense:  90 tablet    Refill:  0   Semaglutide, 1 MG/DOSE, (OZEMPIC, 1 MG/DOSE,) 4 MG/3ML SOPN    Sig: Inject 1 mg into the skin once a week.    Dispense:  3 mL    Refill:  5     Follow-up: No follow-ups on file.  [name], M.D.

## 2022-09-15 NOTE — Telephone Encounter (Signed)
Patient is needing samples of Ozempic. Please call back and advise.

## 2022-09-16 ENCOUNTER — Telehealth: Payer: Self-pay

## 2022-09-16 LAB — CMP14+EGFR
ALT: 20 IU/L (ref 0–44)
AST: 17 IU/L (ref 0–40)
Albumin/Globulin Ratio: 2.3 — ABNORMAL HIGH (ref 1.2–2.2)
Albumin: 4.5 g/dL (ref 3.8–4.8)
Alkaline Phosphatase: 77 IU/L (ref 44–121)
BUN/Creatinine Ratio: 13 (ref 10–24)
BUN: 13 mg/dL (ref 8–27)
Bilirubin Total: 1.6 mg/dL — ABNORMAL HIGH (ref 0.0–1.2)
CO2: 24 mmol/L (ref 20–29)
Calcium: 10.1 mg/dL (ref 8.6–10.2)
Chloride: 101 mmol/L (ref 96–106)
Creatinine, Ser: 0.98 mg/dL (ref 0.76–1.27)
Globulin, Total: 2 g/dL (ref 1.5–4.5)
Glucose: 159 mg/dL — ABNORMAL HIGH (ref 70–99)
Potassium: 5.3 mmol/L — ABNORMAL HIGH (ref 3.5–5.2)
Sodium: 141 mmol/L (ref 134–144)
Total Protein: 6.5 g/dL (ref 6.0–8.5)
eGFR: 81 mL/min/{1.73_m2} (ref >59)

## 2022-09-16 LAB — LIPID PANEL
Chol/HDL Ratio: 4.3 ratio (ref 0.0–5.0)
Cholesterol, Total: 137 mg/dL (ref 100–199)
HDL: 32 mg/dL — ABNORMAL LOW (ref >39)
LDL Chol Calc (NIH): 60 mg/dL (ref 0–99)
Triglycerides: 283 mg/dL — ABNORMAL HIGH (ref 0–149)
VLDL Cholesterol Cal: 45 mg/dL — ABNORMAL HIGH (ref 5–40)

## 2022-09-16 LAB — TSH+FREE T4
Free T4: 1.65 ng/dL (ref 0.82–1.77)
TSH: 0.046 u[IU]/mL — ABNORMAL LOW (ref 0.450–4.500)

## 2022-09-16 LAB — CBC WITH DIFFERENTIAL/PLATELET
Basophils Absolute: 0 10*3/uL (ref 0.0–0.2)
Basos: 0 %
EOS (ABSOLUTE): 0.2 10*3/uL (ref 0.0–0.4)
Eos: 3 %
Hematocrit: 53.9 % — ABNORMAL HIGH (ref 37.5–51.0)
Hemoglobin: 18.2 g/dL — ABNORMAL HIGH (ref 13.0–17.7)
Immature Grans (Abs): 0 10*3/uL (ref 0.0–0.1)
Immature Granulocytes: 0 %
Lymphocytes Absolute: 1.9 10*3/uL (ref 0.7–3.1)
Lymphs: 37 %
MCH: 30.2 pg (ref 26.6–33.0)
MCHC: 33.8 g/dL (ref 31.5–35.7)
MCV: 90 fL (ref 79–97)
Monocytes Absolute: 0.5 10*3/uL (ref 0.1–0.9)
Monocytes: 9 %
Neutrophils Absolute: 2.7 10*3/uL (ref 1.4–7.0)
Neutrophils: 51 %
Platelets: 189 10*3/uL (ref 150–450)
RBC: 6.02 x10E6/uL — ABNORMAL HIGH (ref 4.14–5.80)
RDW: 12.8 % (ref 11.6–15.4)
WBC: 5.2 10*3/uL (ref 3.4–10.8)

## 2022-09-16 NOTE — Telephone Encounter (Signed)
DOSE WAS INCREASED, PATIENT IS ON THE PATIENT ASSISTANCE FOR THIS MED AND NEEDS DOSE INCREASE SENT IN AND SAMPLES UNTIL MEDS ARE RECIEVED

## 2022-09-16 NOTE — Telephone Encounter (Signed)
Received notification from [employer] regarding RE-ENROLLMENT approval for FARXIGA. Patient assistance approved from 12/06/22 to 12/06/23.  Phone: [phone]  

## 2022-09-18 NOTE — Progress Notes (Signed)
Hello Billy Nolan,  Your lab result is normal and/or stable.Some minor variations that are not significant are commonly marked abnormal, but do not represent any medical problem for you.  Best regards, [name], M.D.

## 2022-09-20 ENCOUNTER — Telehealth: Payer: Self-pay | Admitting: *Deleted

## 2022-09-20 NOTE — Telephone Encounter (Signed)
(  Key: [id number]) Rx #: [id number] Ozempic (1 MG/DOSE) '4MG'$ Fayne Mediate pen-injectors   Form [employer] Electronic Prior Authorization Form 2017 NCPDP  Sent to plan

## 2022-09-21 ENCOUNTER — Telehealth: Payer: Self-pay | Admitting: Pharmacist

## 2022-09-21 NOTE — Telephone Encounter (Signed)
Approved.  

## 2022-09-21 NOTE — Telephone Encounter (Signed)
Patient to increase to ozempic '1mg'$  weekly Dose change form sent to [employer] patient assistance program Patient aware Denies personal and family history of Medullary thyroid cancer ([place]) Should arrive to PCP office within 4 weeks

## 2022-09-24 ENCOUNTER — Telehealth: Payer: Self-pay | Admitting: Pharmacist

## 2022-09-24 NOTE — Telephone Encounter (Signed)
Called patient  Ozempic 0.'5mg'$  weekly #4 boxes arrived Patient aware to take 2 injections to equal '1mg'$ 

## 2022-10-05 ENCOUNTER — Ambulatory Visit: Payer: Self-pay

## 2022-10-05 ENCOUNTER — Ambulatory Visit (INDEPENDENT_AMBULATORY_CARE_PROVIDER_SITE_OTHER): Payer: PPO

## 2022-10-05 DIAGNOSIS — E1159 Type 2 diabetes mellitus with other circulatory complications: Secondary | ICD-10-CM | POA: Diagnosis not present

## 2022-10-05 DIAGNOSIS — E1141 Type 2 diabetes mellitus with diabetic mononeuropathy: Secondary | ICD-10-CM

## 2022-10-05 DIAGNOSIS — I10 Essential (primary) hypertension: Secondary | ICD-10-CM | POA: Diagnosis not present

## 2022-10-05 NOTE — Chronic Care Management (AMB) (Signed)
Chronic Care Management Provider Comprehensive Care Plan    10/05/2022 Name: Billy Nolan MRN: [id number] DOB: 03-19-1946  Referral to Chronic Care Management (CCM) services was placed by Provider:  [name] on Date: 09/10/22.  Chronic Condition 1: HTN Provider Assessment and Plan I have discontinued Billy Nolan's lisinopril. I am also having him maintain his dapagliflozin propanediol, Ozempic (0.25 or 0.5 MG/DOSE), famotidine, rosuvastatin, pantoprazole, FreeStyle Libre 2 Sensor, and levothyroxine   Expected Outcome/Goals Addressed This Visit (Provider CCM goals/Provider Assessment and plan  Goal: CCM (Hypertension) Expected Outcome: Monitor, Self-Manage and Reduce Symptoms of Hypertension   Symptom Management Condition 1: Take all medications as prescribed Attend all scheduled provider appointments Call pharmacy for medication refills 3-7 days in advance of running out of medications Call provider office for new concerns or questions     Chronic Condition 2: DM Provider Assessment and Plan  Type 2 diabetes mellitus with diabetic mononeuropathy, without long-term current use of insulin ([place]) -     Bayer DCA Hb A1c Waived -     CBC with Differential/Platelet -     CMP14+EGFR -     dapagliflozin propanediol (FARXIGA) 10 MG TABS tablet; Take 1 tablet (10 mg total) by mouth daily before breakfast.   Expected Outcome/Goals Addressed This Visit (Provider CCM goals/Provider Assessment and plan  Goal: CCM (Diabetes) Expected Outcome: Monitor, Self-Manage and Reduce Symptoms of Diabetes  Symptom Management Condition 2: Take all medications as prescribed Attend all scheduled provider appointments Call pharmacy for medication refills 3-7 days in advance of running out of medications Call provider office for new concerns or questions   Problem List Patient Active Problem List   Diagnosis Date Noted   Hypothyroidism 09/15/2022   Fusion of lumbar spine 06/17/2021   Spinal  stenosis, lumbar region, with neurogenic claudication 01/30/2016   DM (diabetes mellitus) ([place]) 10/24/2013   Hyperlipidemia 10/24/2013    Medication Management  Current Outpatient Medications:    Continuous Blood Gluc Sensor (FREESTYLE LIBRE 2 SENSOR) MISC, USE TO CHECK BLOOD SUGAR 6 TIMES DAILY, Disp: 6 each, Rfl: 2   dapagliflozin propanediol (FARXIGA) 10 MG TABS tablet, Take 1 tablet (10 mg total) by mouth daily before breakfast., Disp: 90 tablet, Rfl: 3   famotidine (PEPCID) 20 MG tablet, Take 1 tablet (20 mg total) by mouth daily. For reflux / heartburn (Patient taking differently: Take 20 mg by mouth daily as needed. For reflux / heartburn), Disp: 90 tablet, Rfl: 3   levothyroxine (SYNTHROID) 50 MCG tablet, Take 1 tablet (50 mcg total) by mouth daily., Disp: 90 tablet, Rfl: 0   pantoprazole (PROTONIX) 40 MG tablet, Take 1 tablet (40 mg total) by mouth daily., Disp: 90 tablet, Rfl: 3   rosuvastatin (CRESTOR) 20 MG tablet, Take 1 tablet (20 mg total) by mouth daily., Disp: 90 tablet, Rfl: 3   Semaglutide, 1 MG/DOSE, (OZEMPIC, 1 MG/DOSE,) 4 MG/3ML SOPN, Inject 1 mg into the skin once a week., Disp: 3 mL, Rfl: 5  Cognitive Assessment Identity Confirmed: : Name; DOB Cognitive Status: Normal  Functional Assessment Hearing Difficulty or Deaf: no Wear Glasses or Blind: yes Vision Management: Wears glasses Concentrating, Remembering or Making Decisions Difficulty (CP): no Difficulty Communicating: no Walking or Climbing Stairs Difficulty: no Doing Errands Independently Difficulty (such as shopping) (CP): no Change in Functional Status Since Onset of Current Illness/Injury: no  Caregiver Assessment  Primary Source of Support/Comfort: spouse Name of Support/Comfort Primary Source: Billy Nolan People in Home: spouse  Planned Interventions  Hypertension Reviewed plan  for blood pressure management. Lisinopril was previously discontinued. Provided information regarding established blood  pressure parameters along with indications for notifying a provider. Advised to monitor a few times a week if unable to monitor daily and record readings.  Discussed activity tolerance. Reports doing very well with activity goals. Engages in mild to moderate exercise regularly without complications. Discussed compliance with recommended cardiac prudent diet. Encouraged to read nutrition labels, monitor sodium intake and avoid highly processed foods when possible. Discussed complications of uncontrolled blood pressure.  Reviewed s/sx of heart attack, stroke and worsening symptoms that require immediate medical attention.  Diabetes Reviewed medications and discussed importance of medication adherence.  Advised to continue taking all medications as prescribed. Advised to notify the care management team with concerns related to prescription cost. Provided information regarding importance of consistent blood glucose monitoring. Reviewed recent readings. Currently using a [employer]. Reports overall readings are improving but notes a few high readings in the 200's usually in the afternoons. Reports fasting readings have ranged from the 100's to 120's. Fasting reading was 116 mg/dl today. Reviewed s/sx of hypoglycemia and hyperglycemia along with recommended interventions.  Discussed nutritional intake and importance of complying with a diabetic diet. Reports doing well with nutritional intake and exercise goals. Discussed importance of completing recommended DM preventive care. Reports completing foot care as advised. Will complete scheduled for eye exam in December.  Discussed importance of completing ordered labs as prescribed.  Assessed social determinant of health barriers.   Interaction and coordination with outside resources, practitioners, and providers See CCM Referral  Care Plan: Patient declined

## 2022-10-05 NOTE — Chronic Care Management (AMB) (Signed)
   CCM RN Visit Note   October 05, 2022 Name: Kuzey Ogata MRN: [id number]      DOB: Feb 26, 1946  Subjective: Trellis Vanoverbeke is a 76 y.o. year old male who is a primary care patient of Stacks, Cletus Gash, MD. The patient was referred to the Chronic Care Management team for assistance with care management needs subsequent to provider initiation of CCM services and plan of care.      An unsuccessful telephone outreach was attempted today to contact the patient about Chronic Care Management needs.     PLAN A HIPAA compliant phone message was left for the patient providing contact information and requesting a return call.   Horris Latino RN Care Manager/Chronic Care Management [phone]

## 2022-10-05 NOTE — Chronic Care Management (AMB) (Signed)
Chronic Care Management   CCM RN Visit Note  10/05/2022 Name: Billy Nolan MRN: [id number] DOB: December 04, 1946  Subjective: Billy Nolan is a 76 y.o. year old male who is a primary care patient of Stacks, Cletus Gash, MD. The patient was referred to the Chronic Care Management team for assistance with care management needs subsequent to provider initiation of CCM services and plan of care.    Today's Visit:  Engaged with patient by telephone for initial visit.      Goals Addressed             This Visit's Progress    Goal: CCM (Diabetes) Expected Outcome: Monitor, Self-Manage and Reduce Symptoms of Diabetes       Current Barriers:  Chronic Disease Management support and education needs related to Diabetes Management  Planned Interventions: Reviewed medications and discussed importance of medication adherence.  Advised to continue taking all medications as prescribed. Advised to notify the care management team with concerns related to prescription cost. Provided information regarding importance of consistent blood glucose monitoring. Reviewed recent readings. Currently using a [employer]. Reports overall readings are improving but notes a few high readings in the 200's usually in the afternoons. Reports fasting readings have ranged from the 100's to 120's. Fasting reading was 116 mg/dl today. Reviewed s/sx of hypoglycemia and hyperglycemia along with recommended interventions.  Discussed nutritional intake and importance of complying with a diabetic diet. Reports doing well with nutritional intake and exercise goals. Discussed importance of completing recommended DM preventive care. Reports completing foot care as advised. Will complete scheduled for eye exam in December.  Discussed importance of completing ordered labs as prescribed.  Assessed social determinant of health barriers.  Symptom Management: Take medications as prescribed   Attend all scheduled provider appointments Call  pharmacy for medication refills 3-7 days in advance of running out of medications Call provider office for new concerns or questions  Keep appointment with eye doctor Check blood sugar and keep a log Check feet daily for cuts, sores or redness Read food labels for fat, fiber, carbohydrates and portion size Wear comfortable, well-fitting shoes Continue current exercise routine  Follow Up Plan:   A member of the care management team will follow up next month.       Goal: CCM (Hypertension) Expected Outcome: Monitor, Self-Manage and Reduce Symptoms of Hypertension       Current Barriers:  Chronic Disease Management support and education needs related to Hypertension  Planned Interventions: Reviewed plan for blood pressure management. Lisinopril was previously discontinued. Provided information regarding established blood pressure parameters along with indications for notifying a provider. Advised to monitor a few times a week if unable to monitor daily and record readings.  Discussed activity tolerance. Reports doing very well with activity goals. Engages in mild to moderate exercise regularly without complications. Discussed compliance with recommended cardiac prudent diet. Encouraged to read nutrition labels, monitor sodium intake and avoid highly processed foods when possible. Discussed complications of uncontrolled blood pressure.  Reviewed s/sx of heart attack, stroke and worsening symptoms that require immediate medical attention.   BP Readings from Last 3 Encounters:  09/15/22 114/73  06/14/22 131/82  03/08/22 92/66     Symptom Management: Take medications as prescribed   Attend all scheduled provider appointments Call pharmacy for medication refills 3-7 days in advance of running out of medications Call provider office for new concerns or questions  Monitor blood pressure Report new symptoms to your doctor   Follow Up Plan:  A member of the care management team will  follow up next month            PLAN: A member of the care management team will follow up next month.   Horris Latino RN Care Manager/Chronic Care Management [phone]

## 2022-10-05 NOTE — Chronic Care Management (AMB) (Incomplete)
  Chronic Care Management   CCM RN Visit Note  10/05/2022 Name: Billy Nolan MRN: [id number] DOB: 07/27/1946  Subjective: Billy Nolan is a 76 y.o. year old male who is a primary care patient of Stacks, Cletus Gash, MD. The patient was referred to the Chronic Care Management team for assistance with care management needs subsequent to provider initiation of CCM services and plan of care.    Today's Visit:  {CCMTELEPHONEFACETOFACE:21091510} for {CCMINITIALFOLLOWUPCHOICE:21091511}.        Goals Addressed   None     Plan:{CM FOLLOW UP PLAN:25073}  SIG***

## 2022-10-11 DIAGNOSIS — L570 Actinic keratosis: Secondary | ICD-10-CM | POA: Diagnosis not present

## 2022-10-11 DIAGNOSIS — Z1283 Encounter for screening for malignant neoplasm of skin: Secondary | ICD-10-CM | POA: Diagnosis not present

## 2022-10-11 DIAGNOSIS — D239 Other benign neoplasm of skin, unspecified: Secondary | ICD-10-CM | POA: Diagnosis not present

## 2022-10-11 DIAGNOSIS — L2082 Flexural eczema: Secondary | ICD-10-CM | POA: Diagnosis not present

## 2022-10-14 NOTE — Telephone Encounter (Signed)
Disregard previous note - patient ELIGIBLE for 2024 enrollment.

## 2022-10-15 NOTE — Patient Instructions (Signed)
Thank you for allowing the Chronic Care Management team to participate in your care. It was great speaking with you!  Following is a copy of your full provider care plan:   Goals Addressed             This Visit's Progress    Goal: CCM (Diabetes) Expected Outcome: Monitor, Self-Manage and Reduce Symptoms of Diabetes       Current Barriers:  Chronic Disease Management support and education needs related to Diabetes Management  Planned Interventions: Reviewed medications and discussed importance of medication adherence.  Advised to continue taking all medications as prescribed. Advised to notify the care management team with concerns related to prescription cost. Provided information regarding importance of consistent blood glucose monitoring. Reviewed recent readings. Currently using a [employer]. Reports overall readings are improving but notes a few high readings in the 200's usually in the afternoons. Reports fasting readings have ranged from the 100's to 120's. Fasting reading was 116 mg/dl today. Reviewed s/sx of hypoglycemia and hyperglycemia along with recommended interventions.  Discussed nutritional intake and importance of complying with a diabetic diet. Reports doing well with nutritional intake and exercise goals. Discussed importance of completing recommended DM preventive care. Reports completing foot care as advised. Will complete scheduled for eye exam in December.  Discussed importance of completing ordered labs as prescribed.  Assessed social determinant of health barriers.  Symptom Management: Take medications as prescribed   Attend all scheduled provider appointments Call pharmacy for medication refills 3-7 days in advance of running out of medications Call provider office for new concerns or questions  Keep appointment with eye doctor Check blood sugar and keep a log Check feet daily for cuts, sores or redness Read food labels for fat, fiber, carbohydrates and  portion size Wear comfortable, well-fitting shoes Continue current exercise routine  Follow Up Plan:   A member of the care management team will follow up next month.       Goal: CCM (Hypertension) Expected Outcome: Monitor, Self-Manage and Reduce Symptoms of Hypertension       Current Barriers:  Chronic Disease Management support and education needs related to Hypertension  Planned Interventions: Reviewed plan for blood pressure management. Lisinopril was previously discontinued. Provided information regarding established blood pressure parameters along with indications for notifying a provider. Advised to monitor a few times a week if unable to monitor daily and record readings.  Discussed activity tolerance. Reports doing very well with activity goals. Engages in mild to moderate exercise regularly without complications. Discussed compliance with recommended cardiac prudent diet. Encouraged to read nutrition labels, monitor sodium intake and avoid highly processed foods when possible. Discussed complications of uncontrolled blood pressure.  Reviewed s/sx of heart attack, stroke and worsening symptoms that require immediate medical attention.   BP Readings from Last 3 Encounters:  09/15/22 114/73  06/14/22 131/82  03/08/22 92/66     Symptom Management: Take medications as prescribed   Attend all scheduled provider appointments Call pharmacy for medication refills 3-7 days in advance of running out of medications Call provider office for new concerns or questions  Monitor blood pressure Report new symptoms to your doctor   Follow Up Plan:   A member of the care management team will follow up next month          Mr. Billy Nolan verbalized understanding of the information discussed during the telephonic outreach. Declined need for mailed instructions or resources.  A member of the care management team will follow up next  month.    Horris Latino RN Care Manager/Chronic  Care Management [phone]

## 2022-11-08 ENCOUNTER — Other Ambulatory Visit (HOSPITAL_COMMUNITY): Payer: Self-pay

## 2022-11-08 NOTE — Telephone Encounter (Signed)
MAILED RENEWAL TO PT HOME

## 2022-11-16 ENCOUNTER — Ambulatory Visit: Payer: PPO | Admitting: Pharmacist

## 2022-11-16 DIAGNOSIS — E1141 Type 2 diabetes mellitus with diabetic mononeuropathy: Secondary | ICD-10-CM

## 2022-11-18 MED ORDER — DAPAGLIFLOZIN PROPANEDIOL 10 MG PO TABS: 10.0000 mg | ORAL_TABLET | Freq: Every day | ORAL | 3 refills | Status: DC

## 2022-11-18 NOTE — Progress Notes (Signed)
  Care Management   Follow Up Note   11/16/2022 Name: Billy Nolan MRN: [id number] DOB: 03-16-1946   Referred by: [name], MD Reason for referral : Chronic Care Management and Diabetes  Patient assistance paperwork prepared and completed with patient for Ozempic '1mg'$  weekly. A1c 7.3%.  Will also re-enroll in Farxiga/AZ&me patient assistance.   [name], PharmD, BCPS, BCACP Clinical Pharmacist, [place]  II  T [phone]

## 2022-12-02 LAB — HM DIABETES EYE EXAM

## 2022-12-16 ENCOUNTER — Encounter: Payer: Self-pay | Admitting: Family Medicine

## 2022-12-16 ENCOUNTER — Ambulatory Visit (INDEPENDENT_AMBULATORY_CARE_PROVIDER_SITE_OTHER): Payer: PPO | Admitting: Family Medicine

## 2022-12-16 VITALS — BP 109/70 | HR 73 | Temp 97.7°F | Ht 68.0 in | Wt 149.0 lb

## 2022-12-16 DIAGNOSIS — E785 Hyperlipidemia, unspecified: Secondary | ICD-10-CM | POA: Diagnosis not present

## 2022-12-16 DIAGNOSIS — E039 Hypothyroidism, unspecified: Secondary | ICD-10-CM

## 2022-12-16 DIAGNOSIS — I10 Essential (primary) hypertension: Secondary | ICD-10-CM | POA: Diagnosis not present

## 2022-12-16 DIAGNOSIS — E1141 Type 2 diabetes mellitus with diabetic mononeuropathy: Secondary | ICD-10-CM

## 2022-12-16 LAB — LIPID PANEL

## 2022-12-16 LAB — CBC WITH DIFFERENTIAL/PLATELET

## 2022-12-16 LAB — BAYER DCA HB A1C WAIVED: HB A1C (BAYER DCA - WAIVED): 7 % — ABNORMAL HIGH (ref 4.8–5.6)

## 2022-12-16 NOTE — Progress Notes (Signed)
Subjective:  Patient ID: Billy Nolan, male    DOB: [birth year]  Age: 77 y.o. MRN: [id number]  CC: Medical Management of Chronic Issues   HPI Kawon Willcutt presents forFollow-up of diabetes. Patient checks blood sugar at home.  Around 138 . "Staying in the green." On libre monitor Patient denies symptoms such as polyuria, polydipsia, excessive hunger, nausea No significant hypoglycemic spells noted. Medications reviewed. Pt reports taking them regularly without complication/adverse reaction being reported today.   Follow. -up on  thyroid. The patient has a history of hypothyroidism for many years. It has been stable recently. Pt. denies any change in  voice, loss of hair, heat or cold intolerance. Energy level has been adequate to good. Patient denies constipation and diarrhea. No myxedema. Medication is as noted below. Verified that pt is taking it daily on an empty stomach. Well tolerated.    History Pierce has a past medical history of Allergy, Diabetes mellitus without complication ([place]), GERD (gastroesophageal reflux disease), Hyperlipidemia, Hypertension, and Thyroid disease (2023).   He has a past surgical history that includes dental surgeries; Lumbar laminectomy/decompression microdiscectomy (Left, 01/30/2016); Anterior lateral lumbar fusion with percutaneous screw 1 level (N/A, 06/17/2021); and Colonoscopy.   His family history includes Cancer in his sister; Heart disease in his father and mother; Multiple sclerosis in his sister; Stroke in his father; Ulcerative colitis in his son.He reports that he has never smoked. He has never used smokeless tobacco. He reports that he does not drink alcohol and does not use drugs.  Current Outpatient Medications on File Prior to Visit  Medication Sig Dispense Refill   Continuous Blood Gluc Sensor (FREESTYLE LIBRE 2 SENSOR) MISC USE TO CHECK BLOOD SUGAR 6 TIMES DAILY 6 each 2   dapagliflozin propanediol (FARXIGA) 10 MG TABS tablet Take 1 tablet  (10 mg total) by mouth daily before breakfast. 90 tablet 3   famotidine (PEPCID) 20 MG tablet Take 1 tablet (20 mg total) by mouth daily. For reflux / heartburn (Patient taking differently: Take 20 mg by mouth daily as needed. For reflux / heartburn) 90 tablet 3   levothyroxine (SYNTHROID) 50 MCG tablet Take 1 tablet (50 mcg total) by mouth daily. 90 tablet 0   pantoprazole (PROTONIX) 40 MG tablet Take 1 tablet (40 mg total) by mouth daily. 90 tablet 3   rosuvastatin (CRESTOR) 20 MG tablet Take 1 tablet (20 mg total) by mouth daily. 90 tablet 3   Semaglutide, 1 MG/DOSE, (OZEMPIC, 1 MG/DOSE,) 4 MG/3ML SOPN Inject 1 mg into the skin once a week. 3 mL 5   No current facility-administered medications on file prior to visit.    ROS Review of Systems  Constitutional:  Negative for fever.  Respiratory:  Negative for shortness of breath.   Cardiovascular:  Negative for chest pain.  Musculoskeletal:  Negative for arthralgias.  Skin:  Negative for rash.    Objective:  BP 109/70   Pulse 73   Temp 97.7 F (36.5 C)   Ht '5\' 8"'$  (1.727 m)   Wt 149 lb (67.6 kg)   SpO2 97%   BMI 22.66 kg/m   BP Readings from Last 3 Encounters:  12/16/22 109/70  09/15/22 114/73  06/14/22 131/82    Wt Readings from Last 3 Encounters:  12/16/22 149 lb (67.6 kg)  09/15/22 154 lb 9.6 oz (70.1 kg)  06/14/22 153 lb 3.2 oz (69.5 kg)     Physical Exam Vitals reviewed.  Constitutional:      Appearance: He is well-developed.  HENT:     Head: Normocephalic and atraumatic.     Right Ear: External ear normal.     Left Ear: External ear normal.     Mouth/Throat:     Pharynx: No oropharyngeal exudate or posterior oropharyngeal erythema.  Eyes:     Pupils: Pupils are equal, round, and reactive to light.  Cardiovascular:     Rate and Rhythm: Normal rate and regular rhythm.     Heart sounds: No murmur heard. Pulmonary:     Effort: No respiratory distress.     Breath sounds: Normal breath sounds.   Musculoskeletal:     Cervical back: Normal range of motion and neck supple.  Neurological:     Mental Status: He is alert and oriented to person, place, and time.       Assessment & Plan:   Kyzen was seen today for medical management of chronic issues.  Diagnoses and all orders for this visit:  Type 2 diabetes mellitus with diabetic mononeuropathy, without long-term current use of insulin (HCC) -     Bayer DCA Hb A1c Waived  Essential hypertension -     CBC with Differential/Platelet -     CMP14+EGFR  Hyperlipidemia, unspecified hyperlipidemia type -     Lipid panel  Hypothyroidism, unspecified type -     TSH + free T4      I am having [name] maintain his famotidine, rosuvastatin, pantoprazole, FreeStyle Libre 2 Sensor, levothyroxine, Ozempic (1 MG/DOSE), and dapagliflozin propanediol.  No orders of the defined types were placed in this encounter.    Follow-up: Return in about 3 months (around 03/17/2023).  [name], M.D.

## 2022-12-17 LAB — CBC WITH DIFFERENTIAL/PLATELET
Basophils Absolute: 0 10*3/uL (ref 0.0–0.2)
Basos: 1 %
EOS (ABSOLUTE): 0.2 10*3/uL (ref 0.0–0.4)
Eos: 3 %
Hematocrit: 51.6 % — ABNORMAL HIGH (ref 37.5–51.0)
Hemoglobin: 17.7 g/dL (ref 13.0–17.7)
Immature Grans (Abs): 0 10*3/uL (ref 0.0–0.1)
Immature Granulocytes: 0 %
Lymphocytes Absolute: 1.7 10*3/uL (ref 0.7–3.1)
Lymphs: 35 %
MCH: 31.5 pg (ref 26.6–33.0)
MCHC: 34.3 g/dL (ref 31.5–35.7)
MCV: 92 fL (ref 79–97)
Monocytes Absolute: 0.3 10*3/uL (ref 0.1–0.9)
Monocytes: 6 %
Neutrophils Absolute: 2.7 10*3/uL (ref 1.4–7.0)
Neutrophils: 55 %
Platelets: 197 10*3/uL (ref 150–450)
RBC: 5.62 x10E6/uL (ref 4.14–5.80)
RDW: 13.1 % (ref 11.6–15.4)
WBC: 5 10*3/uL (ref 3.4–10.8)

## 2022-12-17 LAB — CMP14+EGFR
ALT: 16 IU/L (ref 0–44)
AST: 12 IU/L (ref 0–40)
Albumin/Globulin Ratio: 1.8 (ref 1.2–2.2)
Albumin: 4.5 g/dL (ref 3.8–4.8)
Alkaline Phosphatase: 69 IU/L (ref 44–121)
BUN/Creatinine Ratio: 11 (ref 10–24)
BUN: 11 mg/dL (ref 8–27)
Bilirubin Total: 2.5 mg/dL — ABNORMAL HIGH (ref 0.0–1.2)
CO2: 21 mmol/L (ref 20–29)
Calcium: 9.8 mg/dL (ref 8.6–10.2)
Chloride: 101 mmol/L (ref 96–106)
Creatinine, Ser: 1.01 mg/dL (ref 0.76–1.27)
Globulin, Total: 2.5 g/dL (ref 1.5–4.5)
Glucose: 145 mg/dL — ABNORMAL HIGH (ref 70–99)
Potassium: 4.9 mmol/L (ref 3.5–5.2)
Sodium: 142 mmol/L (ref 134–144)
Total Protein: 7 g/dL (ref 6.0–8.5)
eGFR: 78 mL/min/{1.73_m2} (ref >59)

## 2022-12-17 LAB — LIPID PANEL
Chol/HDL Ratio: 3.9 ratio (ref 0.0–5.0)
Cholesterol, Total: 135 mg/dL (ref 100–199)
HDL: 35 mg/dL — ABNORMAL LOW (ref >39)
LDL Chol Calc (NIH): 74 mg/dL (ref 0–99)
Triglycerides: 148 mg/dL (ref 0–149)
VLDL Cholesterol Cal: 26 mg/dL (ref 5–40)

## 2022-12-17 LAB — TSH+FREE T4
Free T4: 1.51 ng/dL (ref 0.82–1.77)
TSH: 2.21 u[IU]/mL (ref 0.450–4.500)

## 2023-01-05 ENCOUNTER — Telehealth: Payer: PPO

## 2023-01-10 NOTE — Telephone Encounter (Signed)
Received notification from [place] regarding re-enrollment approval for Billy Nolan. Patient assistance approved from 12/21/22 to 12/22/23  Phone: [phone]

## 2023-01-19 ENCOUNTER — Other Ambulatory Visit: Payer: Self-pay | Admitting: Family Medicine

## 2023-01-27 ENCOUNTER — Telehealth: Payer: Self-pay | Admitting: Pharmacist

## 2023-01-27 NOTE — Telephone Encounter (Signed)
PATIENT MADE AWARE OF OZEMPIC PAP SHIPMENT DOING WELL ON CURRENT REGIMEN CONTINUE CURRENT THERAPY F/U WITH PHARMD IN 6 MONTHS

## 2023-02-11 ENCOUNTER — Encounter: Payer: Self-pay | Admitting: Pharmacist

## 2023-02-28 ENCOUNTER — Telehealth: Payer: Self-pay | Admitting: Family Medicine

## 2023-02-28 DIAGNOSIS — E1141 Type 2 diabetes mellitus with diabetic mononeuropathy: Secondary | ICD-10-CM

## 2023-02-28 NOTE — Telephone Encounter (Signed)
Spouse says that pt is out of farxiga He has 3 weeks left of ozempic. No refills Spouse says that he was approved and re-enrolled for 2024 for  farxiga assistance but has not received no medication. Spouse asking to talk to [place] about pt assistance. Please call back

## 2023-03-01 MED ORDER — DAPAGLIFLOZIN PROPANEDIOL 10 MG PO TABS: 10.0000 mg | ORAL_TABLET | Freq: Every day | ORAL | 5 refills | Status: DC

## 2023-03-01 NOTE — Telephone Encounter (Signed)
FARXIGA REFILLS ESCRIBED TO [place] 1 YR (2024)

## 2023-03-15 ENCOUNTER — Encounter: Payer: Self-pay | Admitting: Family Medicine

## 2023-03-15 ENCOUNTER — Ambulatory Visit (INDEPENDENT_AMBULATORY_CARE_PROVIDER_SITE_OTHER): Payer: PPO | Admitting: Family Medicine

## 2023-03-15 ENCOUNTER — Telehealth: Payer: Self-pay | Admitting: Pharmacist

## 2023-03-15 VITALS — BP 119/66 | HR 79 | Temp 97.4°F | Ht 68.0 in | Wt 151.4 lb

## 2023-03-15 DIAGNOSIS — E039 Hypothyroidism, unspecified: Secondary | ICD-10-CM

## 2023-03-15 DIAGNOSIS — E785 Hyperlipidemia, unspecified: Secondary | ICD-10-CM | POA: Diagnosis not present

## 2023-03-15 DIAGNOSIS — L309 Dermatitis, unspecified: Secondary | ICD-10-CM

## 2023-03-15 DIAGNOSIS — I10 Essential (primary) hypertension: Secondary | ICD-10-CM

## 2023-03-15 DIAGNOSIS — E1141 Type 2 diabetes mellitus with diabetic mononeuropathy: Secondary | ICD-10-CM

## 2023-03-15 LAB — CBC WITH DIFFERENTIAL/PLATELET

## 2023-03-15 LAB — TSH+FREE T4

## 2023-03-15 LAB — BAYER DCA HB A1C WAIVED: HB A1C (BAYER DCA - WAIVED): 6.9 % — ABNORMAL HIGH (ref 4.8–5.6)

## 2023-03-15 MED ORDER — FAMOTIDINE 20 MG PO TABS: 20.0000 mg | ORAL_TABLET | Freq: Every day | ORAL | 3 refills | Status: DC

## 2023-03-15 MED ORDER — ROSUVASTATIN CALCIUM 20 MG PO TABS: 20.0000 mg | ORAL_TABLET | Freq: Every day | ORAL | 3 refills | Status: DC

## 2023-03-15 MED ORDER — MOMETASONE FUROATE 0.1 % EX CREA: 1.0000 | TOPICAL_CREAM | Freq: Every day | CUTANEOUS | 0 refills | Status: DC

## 2023-03-15 MED ORDER — PANTOPRAZOLE SODIUM 40 MG PO TBEC: 40.0000 mg | DELAYED_RELEASE_TABLET | Freq: Every day | ORAL | 3 refills | Status: DC

## 2023-03-15 MED ORDER — DAPAGLIFLOZIN PROPANEDIOL 10 MG PO TABS
10.0000 mg | ORAL_TABLET | Freq: Every day | ORAL | 5 refills | Status: DC
Start: 2023-03-15 — End: 2023-10-20

## 2023-03-15 NOTE — Progress Notes (Signed)
Subjective:  Patient ID: Billy Nolan,  male    DOB: 12-14-1945  Age: 77 y.o.    CC: Medical Management of Chronic Issues   HPI Billy Nolan presents for  follow-up of hypertension. Patient has no history of headache chest pain or shortness of breath or recent cough. Patient also denies symptoms of TIA such as numbness weakness lateralizing. Patient denies side effects from medication. States taking it regularly.  Patient also  in for follow-up of elevated cholesterol. Doing well without complaints on current medication. Denies side effects  including myalgia and arthralgia and nausea. Also in today for liver function testing. Currently no chest pain, shortness of breath or other cardiovascular related symptoms noted.  Follow-up of diabetes. Patient does check blood sugar at home. Readings"stay in range." Patient denies symptoms such as excessive hunger or urinary frequency, excessive hunger, nausea No significant hypoglycemic spells noted. Medications reviewed. Pt reports taking them regularly. Pt. denies complication/adverse reaction today.    follow-up on  thyroid. The patient has a history of hypothyroidism for many years. It has been stable recently. Pt. denies any change in  voice, loss of hair, heat or cold intolerance. Energy level has been adequate to good. Patient denies constipation and diarrhea. No myxedema. Medication is as noted below. Verified that pt is taking it daily on an empty stomach. Well tolerated.   History Billy Nolan has a past medical history of Allergy, Diabetes mellitus without complication, GERD (gastroesophageal reflux disease), Hyperlipidemia, Hypertension, and Thyroid disease (2023).   Billy Nolan has a past surgical history that includes dental surgeries; Lumbar laminectomy/decompression microdiscectomy (Left, 01/30/2016); Anterior lateral lumbar fusion with percutaneous screw 1 level (N/A, 06/17/2021); and Colonoscopy.   His family history includes Cancer in his  sister; Heart disease in his father and mother; Multiple sclerosis in his sister; Stroke in his father; Ulcerative colitis in his son.Billy Nolan reports that Billy Nolan has never smoked. Billy Nolan has never used smokeless tobacco. Billy Nolan reports that Billy Nolan does not drink alcohol and does not use drugs.  Current Outpatient Medications on File Prior to Visit  Medication Sig Dispense Refill   Continuous Blood Gluc Sensor (FREESTYLE LIBRE 2 SENSOR) MISC USE TO CHECK BLOOD SUGAR 6 TIMES DAILY 6 each 2   levothyroxine (SYNTHROID) 50 MCG tablet Take 1 tablet by mouth once daily 90 tablet 3   Semaglutide, 1 MG/DOSE, (OZEMPIC, 1 MG/DOSE,) 4 MG/3ML SOPN Inject 1 mg into the skin once a week. 3 mL 5   No current facility-administered medications on file prior to visit.    ROS Review of Systems  Constitutional:  Negative for fever.  Respiratory:  Negative for shortness of breath.   Cardiovascular:  Negative for chest pain.  Musculoskeletal:  Negative for arthralgias.  Skin:  Negative for rash.    Objective:  BP 119/66   Pulse 79   Temp (!) 97.4 F (36.3 C)   Ht 5\' 8"  (1.727 m)   Wt 151 lb 6.4 oz (68.7 kg)   SpO2 97%   BMI 23.02 kg/m   BP Readings from Last 3 Encounters:  03/15/23 119/66  12/16/22 109/70  09/15/22 114/73    Wt Readings from Last 3 Encounters:  03/15/23 151 lb 6.4 oz (68.7 kg)  12/16/22 149 lb (67.6 kg)  09/15/22 154 lb 9.6 oz (70.1 kg)     Physical Exam Vitals reviewed.  Constitutional:      Appearance: Billy Nolan is well-developed.  HENT:     Head: Normocephalic and atraumatic.     Right Ear:  External ear normal.     Left Ear: External ear normal.     Mouth/Throat:     Pharynx: No oropharyngeal exudate or posterior oropharyngeal erythema.  Eyes:     Pupils: Pupils are equal, round, and reactive to light.  Cardiovascular:     Rate and Rhythm: Normal rate and regular rhythm.     Heart sounds: No murmur heard. Pulmonary:     Effort: No respiratory distress.     Breath sounds: Normal breath  sounds.  Musculoskeletal:     Cervical back: Normal range of motion and neck supple.  Neurological:     Mental Status: Billy Nolan is alert and oriented to person, place, and time.     Diabetic Foot Exam - Simple   Simple Foot Form Diabetic Foot exam was performed with the following findings: Yes 03/15/2023  8:43 AM  Visual Inspection See comments: Yes Sensation Testing Intact to touch and monofilament testing bilaterally: Yes Pulse Check Posterior Tibialis and Dorsalis pulse intact bilaterally: Yes Comments Soles very dry. Discussed moisturizer     Lab Results  Component Value Date   HGBA1C 7.0 (H) 12/16/2022   HGBA1C 7.3 (H) 09/15/2022   HGBA1C 6.6 (H) 06/14/2022    Assessment & Plan:   [name] was seen today for medical management of chronic issues.  Diagnoses and all orders for this visit:  Type 2 diabetes mellitus with diabetic mononeuropathy, without long-term current use of insulin -     Bayer DCA Hb A1c Waived -     rosuvastatin (CRESTOR) 20 MG tablet; Take 1 tablet (20 mg total) by mouth daily. -     Microalbumin / creatinine urine ratio  Essential hypertension -     CBC with Differential/Platelet -     CMP14+EGFR  Hyperlipidemia, unspecified hyperlipidemia type -     Lipid panel -     rosuvastatin (CRESTOR) 20 MG tablet; Take 1 tablet (20 mg total) by mouth daily.  Hypothyroidism, unspecified type -     TSH + free T4  Eczema, unspecified type  Other orders -     famotidine (PEPCID) 20 MG tablet; Take 1 tablet (20 mg total) by mouth daily. For reflux / heartburn -     pantoprazole (PROTONIX) 40 MG tablet; Take 1 tablet (40 mg total) by mouth daily. -     mometasone (ELOCON) 0.1 % cream; Apply 1 Application topically daily.   I am having Billy Nolan start on mometasone. I am also having him maintain his FreeStyle Libre 2 Sensor, Ozempic (1 MG/DOSE), levothyroxine, famotidine, pantoprazole, and rosuvastatin.  Meds ordered this encounter  Medications    famotidine (PEPCID) 20 MG tablet    Sig: Take 1 tablet (20 mg total) by mouth daily. For reflux / heartburn    Dispense:  90 tablet    Refill:  3   pantoprazole (PROTONIX) 40 MG tablet    Sig: Take 1 tablet (40 mg total) by mouth daily.    Dispense:  90 tablet    Refill:  3   rosuvastatin (CRESTOR) 20 MG tablet    Sig: Take 1 tablet (20 mg total) by mouth daily.    Dispense:  90 tablet    Refill:  3   mometasone (ELOCON) 0.1 % cream    Sig: Apply 1 Application topically daily.    Dispense:  45 g    Refill:  0     Follow-up: Return in about 3 months (around 06/14/2023).  Mechele Claude, M.D.

## 2023-03-15 NOTE — Telephone Encounter (Signed)
Please make sure patient is re-enrolled for farxiga 10mg  daily Escribed to [facility] PAP can be emailed to me

## 2023-03-16 LAB — CBC WITH DIFFERENTIAL/PLATELET
Basophils Absolute: 0.1 10*3/uL (ref 0.0–0.2)
Basos: 1 %
EOS (ABSOLUTE): 0.3 10*3/uL (ref 0.0–0.4)
Eos: 5 %
Hematocrit: 51.8 % — ABNORMAL HIGH (ref 37.5–51.0)
Hemoglobin: 17.1 g/dL (ref 13.0–17.7)
Immature Grans (Abs): 0 10*3/uL (ref 0.0–0.1)
Immature Granulocytes: 0 %
Lymphocytes Absolute: 2 10*3/uL (ref 0.7–3.1)
Lymphs: 35 %
MCH: 30.4 pg (ref 26.6–33.0)
MCHC: 33 g/dL (ref 31.5–35.7)
MCV: 92 fL (ref 79–97)
Monocytes Absolute: 0.4 10*3/uL (ref 0.1–0.9)
Monocytes: 7 %
Neutrophils Absolute: 2.9 10*3/uL (ref 1.4–7.0)
Neutrophils: 52 %
Platelets: 197 10*3/uL (ref 150–450)
RBC: 5.62 x10E6/uL (ref 4.14–5.80)
RDW: 12.3 % (ref 11.6–15.4)
WBC: 5.6 10*3/uL (ref 3.4–10.8)

## 2023-03-16 LAB — MICROALBUMIN / CREATININE URINE RATIO
Creatinine, Urine: 72 mg/dL
Microalb/Creat Ratio: 6 mg/g creat (ref 0–29)
Microalbumin, Urine: 4.6 ug/mL

## 2023-03-16 LAB — CMP14+EGFR
ALT: 23 IU/L (ref 0–44)
AST: 15 IU/L (ref 0–40)
Albumin/Globulin Ratio: 2.2 (ref 1.2–2.2)
Albumin: 4.4 g/dL (ref 3.8–4.8)
Alkaline Phosphatase: 61 IU/L (ref 44–121)
BUN/Creatinine Ratio: 13 (ref 10–24)
BUN: 12 mg/dL (ref 8–27)
Bilirubin Total: 1.8 mg/dL — ABNORMAL HIGH (ref 0.0–1.2)
CO2: 24 mmol/L (ref 20–29)
Calcium: 9.3 mg/dL (ref 8.6–10.2)
Chloride: 103 mmol/L (ref 96–106)
Creatinine, Ser: 0.93 mg/dL (ref 0.76–1.27)
Globulin, Total: 2 g/dL (ref 1.5–4.5)
Glucose: 141 mg/dL — ABNORMAL HIGH (ref 70–99)
Potassium: 4.7 mmol/L (ref 3.5–5.2)
Sodium: 140 mmol/L (ref 134–144)
Total Protein: 6.4 g/dL (ref 6.0–8.5)
eGFR: 86 mL/min/{1.73_m2} (ref >59)

## 2023-03-16 LAB — LIPID PANEL
Chol/HDL Ratio: 3.2 ratio (ref 0.0–5.0)
Cholesterol, Total: 129 mg/dL (ref 100–199)
HDL: 40 mg/dL (ref >39)
LDL Chol Calc (NIH): 62 mg/dL (ref 0–99)
Triglycerides: 161 mg/dL — ABNORMAL HIGH (ref 0–149)
VLDL Cholesterol Cal: 27 mg/dL (ref 5–40)

## 2023-03-16 LAB — TSH+FREE T4: TSH: 1.87 u[IU]/mL (ref 0.450–4.500)

## 2023-03-16 NOTE — Progress Notes (Signed)
Hello Billy Nolan,  Your lab result is normal and/or stable.Some minor variations that are not significant are commonly marked abnormal, but do not represent any medical problem for you.  Best regards, Soraida Vickers, M.D.

## 2023-05-02 ENCOUNTER — Other Ambulatory Visit: Payer: Self-pay | Admitting: Family Medicine

## 2023-05-31 ENCOUNTER — Ambulatory Visit (INDEPENDENT_AMBULATORY_CARE_PROVIDER_SITE_OTHER): Payer: PPO

## 2023-05-31 VITALS — Ht 68.0 in | Wt 147.0 lb

## 2023-05-31 DIAGNOSIS — Z0000 Encounter for general adult medical examination without abnormal findings: Secondary | ICD-10-CM

## 2023-05-31 NOTE — Progress Notes (Signed)
Subjective:   Billy Nolan is a 77 y.o. male who presents for Medicare Annual/Subsequent preventive examination.  Visit Complete: Virtual  I connected with  Billy Nolan on 05/31/23 by a audio enabled telemedicine application and verified that I am speaking with the correct person using two identifiers.  Patient Location: Home  Provider Location: Home Office  I discussed the limitations of evaluation and management by telemedicine. The patient expressed understanding and agreed to proceed.  Patient Medicare AWV questionnaire was completed by the patient on 05/31/2023; I have confirmed that all information answered by patient is correct and no changes since this date.  Review of Systems    Nutrition Risk Assessment:  Has the patient had any N/V/D within the last 2 months?  No  Does the patient have any non-healing wounds?  No  Has the patient had any unintentional weight loss or weight gain?  No   Diabetes:  Is the patient diabetic?  Yes  If diabetic, was a CBG obtained today?  No  Did the patient bring in their glucometer from home?  No  How often do you monitor your CBG's? Libre .   Financial Strains and Diabetes Management:  Are you having any financial strains with the device, your supplies or your medication? No .  Does the patient want to be seen by Chronic Care Management for management of their diabetes?  No  Would the patient like to be referred to a Nutritionist or for Diabetic Management?  No   Diabetic Exams:  Diabetic Eye Exam: Completed 11/2022 Diabetic Foot Exam: Overdue, Pt has been advised about the importance in completing this exam. Pt is scheduled for diabetic foot exam on next office visit .  Cardiac Risk Factors include: advanced age (>4men, >43 women);diabetes mellitus;hypertension;male gender;dyslipidemia     Objective:    Today's Vitals   05/31/23 1326  Weight: 147 lb (66.7 kg)  Height: 5\' 8"  (1.727 m)   Body mass index is 22.35  kg/m.     05/31/2023    1:29 PM 05/28/2022    1:30 PM 06/15/2021    1:29 PM 05/25/2021    9:01 AM 05/22/2020    8:44 AM 04/26/2019   11:48 AM 01/30/2016   10:47 AM  Advanced Directives  Does Patient Have a Medical Advance Directive? Yes Yes Yes Yes Yes Yes Yes  Type of Estate agent of [place];Living will Healthcare Power of [place];Living will Living will;Healthcare Power of [employer] Power of [place];Living will Living will Healthcare Power of [place];Living will Healthcare Power of [place];Living will  Does patient want to make changes to medical advance directive? No - Patient declined   No - Patient declined No - Patient declined No - Patient declined No - Patient declined  Copy of Healthcare Power of Attorney in Chart? Yes - validated most recent copy scanned in chart (See row information) Yes - validated most recent copy scanned in chart (See row information) No - copy requested No - copy requested  No - copy requested No - copy requested    Current Medications (verified) Outpatient Encounter Medications as of 05/31/2023  Medication Sig   Continuous Glucose Sensor (FREESTYLE LIBRE 2 SENSOR) MISC USE TO CHECK BLOOD SUGAR 6 TIMES DAILY   dapagliflozin propanediol (FARXIGA) 10 MG TABS tablet Take 1 tablet (10 mg total) by mouth daily before breakfast.   famotidine (PEPCID) 20 MG tablet Take 1 tablet (20 mg total) by mouth daily. For reflux / heartburn   levothyroxine (SYNTHROID)  50 MCG tablet Take 1 tablet by mouth once daily   mometasone (ELOCON) 0.1 % cream Apply 1 Application topically daily.   pantoprazole (PROTONIX) 40 MG tablet Take 1 tablet (40 mg total) by mouth daily.   rosuvastatin (CRESTOR) 20 MG tablet Take 1 tablet (20 mg total) by mouth daily.   Semaglutide, 1 MG/DOSE, (OZEMPIC, 1 MG/DOSE,) 4 MG/3ML SOPN Inject 1 mg into the skin once a week.   No facility-administered encounter medications on file as of 05/31/2023.    Allergies  (verified) Sulfa antibiotics   History: Past Medical History:  Diagnosis Date   Allergy    Diabetes mellitus without complication (HCC)    GERD (gastroesophageal reflux disease)    Hyperlipidemia    Hypertension    Thyroid disease 2023   hypothyroidism   Past Surgical History:  Procedure Laterality Date   ANTERIOR LATERAL LUMBAR FUSION WITH PERCUTANEOUS SCREW 1 LEVEL N/A 06/17/2021   Procedure: ANTERIOR LATERAL LUMBAR FUSION WITH PERCUTANEOUS SCREW 1 LEVEL (XLIF with PSFI) L3-4;  Surgeon: Venita Lick, MD;  Location: [facility] OR;  Service: Orthopedics;  Laterality: N/A;  5 hrs Left tap block with exparel 3C bed post op   COLONOSCOPY     dental surgeries     teeth extractions, root cannal   LUMBAR LAMINECTOMY/DECOMPRESSION MICRODISCECTOMY Left 01/30/2016   Procedure: LUMBAR LAMINECTOMY/CENTRAL DECOMPRESSION MICRODISCECTOMY L3 - L4 ON THE LEFT;  Surgeon: Ranee Gosselin, MD;  Location: [facility];  Service: Orthopedics;  Laterality: Left;   Family History  Problem Relation Age of Onset   Heart disease Mother    Stroke Father    Heart disease Father    Multiple sclerosis Sister    Cancer Sister    Ulcerative colitis Son    Colon cancer Neg Hx    Rectal cancer Neg Hx    Esophageal cancer Neg Hx    Stomach cancer Neg Hx    Pancreatic cancer Neg Hx    Social History   Socioeconomic History   Marital status: Married    Spouse name: Billy Nolan   Number of children: 3   Years of education: 14   Highest education level: Some college, no degree  Occupational History   Occupation: Retired     Comment: Games developer  Tobacco Use   Smoking status: Never   Smokeless tobacco: Never  Vaping Use   Vaping Use: Never used  Substance and Sexual Activity   Alcohol use: No   Drug use: No   Sexual activity: Not on file  Other Topics Concern   Not on file  Social History Narrative   Independent, going to gym with wife 5 days per week, very active, spends lots of time with family and some  friends   Social Determinants of Health   Financial Resource Strain: Low Risk  (05/31/2023)   Overall Financial Resource Strain (CARDIA)    Difficulty of Paying Living Expenses: Not hard at all  Food Insecurity: No Food Insecurity (05/31/2023)   Hunger Vital Sign    Worried About Running Out of Food in the Last Year: Never true    Ran Out of Food in the Last Year: Never true  Transportation Needs: No Transportation Needs (05/31/2023)   PRAPARE - Administrator, Civil Service (Medical): No    Lack of Transportation (Non-Medical): No  Physical Activity: Insufficiently Active (05/31/2023)   Exercise Vital Sign    Days of Exercise per Week: 3 days    Minutes of Exercise per Session:  30 min  Stress: No Stress Concern Present (05/31/2023)   [employer] of Occupational Health - Occupational Stress Questionnaire    Feeling of Stress : Not at all  Social Connections: Socially Integrated (05/31/2023)   Social Connection and Isolation Panel [NHANES]    Frequency of Communication with Friends and Family: More than three times a week    Frequency of Social Gatherings with Friends and Family: More than three times a week    Attends Religious Services: 1 to 4 times per year    Active Member of [employer] or Organizations: Yes    Attends Engineer, structural: More than 4 times per year    Marital Status: Married    Tobacco Counseling Counseling given: Not Answered   Clinical Intake:  Pre-visit preparation completed: Yes  Pain : No/denies pain     Nutritional Risks: None Diabetes: No  How often do you need to have someone help you when you read instructions, pamphlets, or other written materials from your doctor or pharmacy?: 1 - Never  Interpreter Needed?: No  Information entered by :: Renie Ora, LPN   Activities of Daily Living    05/31/2023    1:29 PM 10/05/2022   11:50 PM  In your present state of health, do you have any difficulty performing the  following activities:  Hearing? 0 0  Vision? 0 0  Difficulty concentrating or making decisions? 0 0  Walking or climbing stairs? 0 0  Dressing or bathing? 0 0  Doing errands, shopping? 0 0  Preparing Food and eating ? N   Using the Toilet? N   In the past six months, have you accidently leaked urine? N   Do you have problems with loss of bowel control? N   Managing your Medications? N   Managing your Finances? N   Housekeeping or managing your Housekeeping? N     Patient Care Team: Mechele Claude, MD as PCP - General (Family Medicine) Danella Maiers, [facility] (Pharmacist)  Indicate any recent Medical Services you may have received from other than [facility] providers in the past year (date may be approximate).     Assessment:   This is a routine wellness examination for [employer].  Hearing/Vision screen Vision Screening - Comments:: Wears rx glasses - up to date with routine eye exams with  Dr.Johnson   Dietary issues and exercise activities discussed:     Goals Addressed             This Visit's Progress    Patient Stated   On track    05/28/2022 AWV Goal: Exercise for General Health  Patient will verbalize understanding of the benefits of increased physical activity: Exercising regularly is important. It will improve your overall fitness, flexibility, and endurance. Regular exercise also will improve your overall health. It can help you control your weight, reduce stress, and improve your bone density. Over the next year, patient will increase physical activity as tolerated with a goal of at least 150 minutes of moderate physical activity per week.  You can tell that you are exercising at a moderate intensity if your heart starts beating faster and you start breathing faster but can still hold a conversation. Moderate-intensity exercise ideas include: Walking 1 mile (1.6 km) in about 15 minutes Biking Hiking Golfing Dancing Water aerobics Patient will verbalize  understanding of everyday activities that increase physical activity by providing examples like the following: Yard work, such as: Corporate treasurer  your car Pushing a stroller Shoveling snow Gardening Washing windows or floors Patient will be able to explain general safety guidelines for exercising:  Before you start a new exercise program, talk with your health care provider. Do not exercise so much that you hurt yourself, feel dizzy, or get very short of breath. Wear comfortable clothes and wear shoes with good support. Drink plenty of water while you exercise to prevent dehydration or heat stroke. Work out until your breathing and your heartbeat get faster.        Depression Screen    05/31/2023    1:28 PM 03/15/2023    8:13 AM 12/16/2022    9:12 AM 10/05/2022    4:30 PM 09/15/2022    8:31 AM 06/14/2022   10:25 AM 05/28/2022    1:23 PM  PHQ 2/9 Scores  PHQ - 2 Score 0 0 0 0 0 0 0    Fall Risk    05/31/2023    1:27 PM 03/15/2023    8:13 AM 12/16/2022    9:12 AM 10/05/2022   11:50 PM 09/15/2022    8:31 AM  Fall Risk   Falls in the past year? 0 0 0 0 0  Number falls in past yr: 0   0   Injury with Fall? 0   0   Risk for fall due to : No Fall Risks      Follow up Falls prevention discussed   Falls prevention discussed     MEDICARE RISK AT HOME:  Medicare Risk at Home - 05/31/23 1327     Any stairs in or around the home? Yes    If so, are there any without handrails? No    Home free of loose throw rugs in walkways, pet beds, electrical cords, etc? Yes    Adequate lighting in your home to reduce risk of falls? Yes    Life alert? No    Use of a cane, walker or w/c? No    Grab bars in the bathroom? Yes    Shower chair or bench in shower? Yes    Elevated toilet seat or a handicapped toilet? Yes             TIMED UP AND GO:  Was the test performed?  No    Cognitive Function:        05/31/2023    1:29 PM 05/28/2022    1:24  PM 05/25/2021    9:06 AM 05/22/2020    8:45 AM 05/22/2020    8:38 AM  6CIT Screen  What Year? 0 points 0 points 0 points 0 points 0 points  What month? 0 points 0 points 0 points 0 points 0 points  What time? 0 points 0 points 0 points 0 points 0 points  Count back from 20 0 points 0 points 0 points 0 points 0 points  Months in reverse 0 points 4 points 0 points 0 points   Repeat phrase 0 points 0 points 0 points 4 points   Total Score 0 points 4 points 0 points 4 points     Immunizations Immunization History  Administered Date(s) Administered   Influenza, High Dose Seasonal PF 11/01/2018   Moderna Sars-Covid-2 Vaccination 12/27/2019, 02/01/2020   Pneumococcal Conjugate-13 07/03/2014   Pneumococcal Polysaccharide-23 03/03/2018   Tdap 03/03/2022   Zoster Recombinat (Shingrix) 03/03/2022, 05/31/2022    TDAP status: Up to date  Flu Vaccine status: Declined, Education has been provided regarding the importance of this vaccine but patient  still declined. Advised may receive this vaccine at local pharmacy or Health Dept. Aware to provide a copy of the vaccination record if obtained from local pharmacy or Health Dept. Verbalized acceptance and understanding.  Pneumococcal vaccine status: Up to date  Covid-19 vaccine status: Completed vaccines  Qualifies for Shingles Vaccine? Yes   Zostavax completed Yes   Shingrix Completed?: Yes  Screening Tests Health Maintenance  Topic Date Due   COVID-19 Vaccine (3 - 2023-24 season) 08/06/2022   INFLUENZA VACCINE  07/07/2023   HEMOGLOBIN A1C  09/14/2023   OPHTHALMOLOGY EXAM  12/03/2023   Diabetic kidney evaluation - eGFR measurement  03/14/2024   Diabetic kidney evaluation - Urine ACR  03/14/2024   FOOT EXAM  03/14/2024   Medicare Annual Wellness (AWV)  05/30/2024   Colonoscopy  09/12/2024   DTaP/Tdap/Td (2 - Td or Tdap) 03/03/2032   Pneumonia Vaccine 82+ Years old  Completed   Hepatitis C Screening  Completed   Zoster Vaccines- Shingrix   Completed   HPV VACCINES  Aged Out    Health Maintenance  Health Maintenance Due  Topic Date Due   COVID-19 Vaccine (3 - 2023-24 season) 08/06/2022    Colorectal cancer screening: Type of screening: Colonoscopy. Completed 09/12/2014. Repeat every 10 years  Lung Cancer Screening: (Low Dose CT Chest recommended if Age 62-80 years, 20 pack-year currently smoking OR have quit w/in 15years.) does not qualify.   Lung Cancer Screening Referral: n/a  Additional Screening:  Hepatitis C Screening: does not qualify; Completed 04/19/2021  Vision Screening: Recommended annual ophthalmology exams for early detection of glaucoma and other disorders of the eye. Is the patient up to date with their annual eye exam?  No  Who is the provider or what is the name of the office in which the patient attends annual eye exams? Dr.Johnson  If pt is not established with a provider, would they like to be referred to a provider to establish care? No .   Dental Screening: Recommended annual dental exams for proper oral hygiene  Diabetic Foot Exam: Diabetic Foot Exam: Overdue, Pt has been advised about the importance in completing this exam. Pt is scheduled for diabetic foot exam on next office visit .  Community Resource Referral / Chronic Care Management: CRR required this visit?  No   CCM required this visit?  No     Plan:     I have personally reviewed and noted the following in the patient's chart:   Medical and social history Use of alcohol, tobacco or illicit drugs  Current medications and supplements including opioid prescriptions. Patient is not currently taking opioid prescriptions. Functional ability and status Nutritional status Physical activity Advanced directives List of other physicians Hospitalizations, surgeries, and ER visits in previous 12 months Vitals Screenings to include cognitive, depression, and falls Referrals and appointments  In addition, I have reviewed and  discussed with patient certain preventive protocols, quality metrics, and best practice recommendations. A written personalized care plan for preventive services as well as general preventive health recommendations were provided to patient.     Lorrene Reid, LPN   05/26/3085   After Visit Summary: (MyChart) Due to this being a telephonic visit, the after visit summary with patients personalized plan was offered to patient via MyChart   Nurse Notes: none

## 2023-05-31 NOTE — Patient Instructions (Signed)
Billy Nolan , Thank you for taking time to come for your Medicare Wellness Visit. I appreciate your ongoing commitment to your health goals. Please review the following plan we discussed and let me know if I can assist you in the future.   These are the goals we discussed:  Goals       Goal: CCM (Diabetes) Expected Outcome: Monitor, Self-Manage and Reduce Symptoms of Diabetes      Current Barriers:  Chronic Disease Management support and education needs related to Diabetes Management  Planned Interventions: Reviewed medications and discussed importance of medication adherence.  Advised to continue taking all medications as prescribed. Advised to notify the care management team with concerns related to prescription cost. Provided information regarding importance of consistent blood glucose monitoring. Reviewed recent readings. Currently using a [employer]. Reports overall readings are improving but notes a few high readings in the 200's usually in the afternoons. Reports fasting readings have ranged from the 100's to 120's. Fasting reading was 116 mg/dl today. Reviewed s/sx of hypoglycemia and hyperglycemia along with recommended interventions.  Discussed nutritional intake and importance of complying with a diabetic diet. Reports doing well with nutritional intake and exercise goals. Discussed importance of completing recommended DM preventive care. Reports completing foot care as advised. Will complete scheduled for eye exam in December.  Discussed importance of completing ordered labs as prescribed.  Assessed social determinant of health barriers.  Symptom Management: Take medications as prescribed   Attend all scheduled provider appointments Call pharmacy for medication refills 3-7 days in advance of running out of medications Call provider office for new concerns or questions  Keep appointment with eye doctor Check blood sugar and keep a log Check feet daily for cuts, sores or  redness Read food labels for fat, fiber, carbohydrates and portion size Wear comfortable, well-fitting shoes Continue current exercise routine  Follow Up Plan:   A member of the care management team will follow up next month.        Goal: CCM (Hypertension) Expected Outcome: Monitor, Self-Manage and Reduce Symptoms of Hypertension      Current Barriers:  Chronic Disease Management support and education needs related to Hypertension  Planned Interventions: Reviewed plan for blood pressure management. Lisinopril was previously discontinued. Provided information regarding established blood pressure parameters along with indications for notifying a provider. Advised to monitor a few times a week if unable to monitor daily and record readings.  Discussed activity tolerance. Reports doing very well with activity goals. Engages in mild to moderate exercise regularly without complications. Discussed compliance with recommended cardiac prudent diet. Encouraged to read nutrition labels, monitor sodium intake and avoid highly processed foods when possible. Discussed complications of uncontrolled blood pressure.  Reviewed s/sx of heart attack, stroke and worsening symptoms that require immediate medical attention.   BP Readings from Last 3 Encounters:  09/15/22 114/73  06/14/22 131/82  03/08/22 92/66     Symptom Management: Take medications as prescribed   Attend all scheduled provider appointments Call pharmacy for medication refills 3-7 days in advance of running out of medications Call provider office for new concerns or questions  Monitor blood pressure Report new symptoms to your doctor   Follow Up Plan:   A member of the care management team will follow up next month        HEMOGLOBIN A1C < 7.0      Continue to monitor carbohydrate intake, exercise, and taking diabetes medications regularly.       Patient Stated  05/28/2022 AWV Goal: Exercise for General Health  Patient  will verbalize understanding of the benefits of increased physical activity: Exercising regularly is important. It will improve your overall fitness, flexibility, and endurance. Regular exercise also will improve your overall health. It can help you control your weight, reduce stress, and improve your bone density. Over the next year, patient will increase physical activity as tolerated with a goal of at least 150 minutes of moderate physical activity per week.  You can tell that you are exercising at a moderate intensity if your heart starts beating faster and you start breathing faster but can still hold a conversation. Moderate-intensity exercise ideas include: Walking 1 mile (1.6 km) in about 15 minutes Biking Hiking Golfing Dancing Water aerobics Patient will verbalize understanding of everyday activities that increase physical activity by providing examples like the following: Yard work, such as: Insurance underwriter Gardening Washing windows or floors Patient will be able to explain general safety guidelines for exercising:  Before you start a new exercise program, talk with your health care provider. Do not exercise so much that you hurt yourself, feel dizzy, or get very short of breath. Wear comfortable clothes and wear shoes with good support. Drink plenty of water while you exercise to prevent dehydration or heat stroke. Work out until your breathing and your heartbeat get faster.       T2DM (pt-stated)      Current Barriers:  Unable to independently afford treatment regimen Unable to achieve control of T2DM  Suboptimal therapeutic regimen for T2DM  Pharmacist Clinical Goal(s):  patient will verbalize ability to afford treatment regimen achieve control of T2DM  as evidenced by GOAL A1C<7% maintain control of T2DM as evidenced by GOAL A1C<7%  through collaboration with PharmD and provider.     Interventions: 1:1 collaboration with Mechele Claude, MD regarding development and update of comprehensive plan of care as evidenced by provider attestation and co-signature Inter-disciplinary care team collaboration (see longitudinal plan of care) Comprehensive medication review performed; medication list updated in electronic medical record  Diabetes: Goal on Track (progressing): YES. Uncontrolled-improved (A1C 8%-->7.5%); current treatment: Ozempic 0.5mg  weekly METFORMIN, Marcelline Deist;  Past meds: Rybelsus (d/c'd d/t gerd/hiatal hernia issues), metformin Approved for [name] PATIENT ASSISTANCE PROGRAM (shipment arrived today #5 march) Education and demo provided Denies personal and family history of Medullary thyroid cancer (MTC) CONTINUE FARXIGA 10MG  DAILY BETTER CKD, CV & T2DM DATA APPROVED FOR AZ&ME PATIENT ASSISTANCE Denies hypoglycemic/hyperglycemic symptoms Discussed meal planning options and Plate method for healthy eating Avoid sugary drinks and desserts Incorporate balanced protein, non starchy veggies, 1 serving of carbohydrate with each meal Increase water intake Increase physical activity as able DISCUSSED blood pressure medications, at goal Current exercise: n/a Collaborated with PCP to change GLP1, SGLT2; medication list updated Assessed patient finances. ENROLLED IN [name] FOR OZEMPIC AND AZ&ME PATIENT ASSISTANCE FOR FARXIGA   Patient Goals/Self-Care Activities patient will:  - take medications as prescribed as evidenced by patient report and record review check glucose DAILY OR IF SYMPTOMATIC, document, and provide at future appointments collaborate with provider on medication access solutions         This is a list of the screening recommended for you and due dates:  Health Maintenance  Topic Date Due   COVID-19 Vaccine (3 - 2023-24 season) 08/06/2022   Flu Shot  07/07/2023   Hemoglobin A1C  09/14/2023   Eye exam for diabetics  12/03/2023   Yearly  kidney function blood test for diabetes  03/14/2024   Yearly kidney health urinalysis for diabetes  03/14/2024   Complete foot exam   03/14/2024   Medicare Annual Wellness Visit  05/30/2024   Colon Cancer Screening  09/12/2024   DTaP/Tdap/Td vaccine (2 - Td or Tdap) 03/03/2032   Pneumonia Vaccine  Completed   Hepatitis C Screening  Completed   Zoster (Shingles) Vaccine  Completed   HPV Vaccine  Aged Out    Advanced directives: In Chart   Conditions/risks identified: Aim for 30 minutes of exercise or brisk walking, 6-8 glasses of water, and 5 servings of fruits and vegetables each day.   Next appointment: Follow up in one year for your annual wellness visit.   Preventive Care 8 Years and Older, Male  Preventive care refers to lifestyle choices and visits with your health care provider that can promote health and wellness. What does preventive care include? A yearly physical exam. This is also called an annual well check. Dental exams once or twice a year. Routine eye exams. Ask your health care provider how often you should have your eyes checked. Personal lifestyle choices, including: Daily care of your teeth and gums. Regular physical activity. Eating a healthy diet. Avoiding tobacco and drug use. Limiting alcohol use. Practicing safe sex. Taking low doses of aspirin every day. Taking vitamin and mineral supplements as recommended by your health care provider. What happens during an annual well check? The services and screenings done by your health care provider during your annual well check will depend on your age, overall health, lifestyle risk factors, and family history of disease. Counseling  Your health care provider may ask you questions about your: Alcohol use. Tobacco use. Drug use. Emotional well-being. Home and relationship well-being. Sexual activity. Eating habits. History of falls. Memory and ability to understand (cognition). Work and work  Astronomer. Screening  You may have the following tests or measurements: Height, weight, and BMI. Blood pressure. Lipid and cholesterol levels. These may be checked every 5 years, or more frequently if you are over 22 years old. Skin check. Lung cancer screening. You may have this screening every year starting at age 81 if you have a 30-pack-year history of smoking and currently smoke or have quit within the past 15 years. Fecal occult blood test (FOBT) of the stool. You may have this test every year starting at age 37. Flexible sigmoidoscopy or colonoscopy. You may have a sigmoidoscopy every 5 years or a colonoscopy every 10 years starting at age 7. Prostate cancer screening. Recommendations will vary depending on your family history and other risks. Hepatitis C blood test. Hepatitis B blood test. Sexually transmitted disease (STD) testing. Diabetes screening. This is done by checking your blood sugar (glucose) after you have not eaten for a while (fasting). You may have this done every 1-3 years. Abdominal aortic aneurysm (AAA) screening. You may need this if you are a current or former smoker. Osteoporosis. You may be screened starting at age 30 if you are at high risk. Talk with your health care provider about your test results, treatment options, and if necessary, the need for more tests. Vaccines  Your health care provider may recommend certain vaccines, such as: Influenza vaccine. This is recommended every year. Tetanus, diphtheria, and acellular pertussis (Tdap, Td) vaccine. You may need a Td booster every 10 years. Zoster vaccine. You may need this after age 75. Pneumococcal 13-valent conjugate (PCV13) vaccine. One dose is recommended  after age 86. Pneumococcal polysaccharide (PPSV23) vaccine. One dose is recommended after age 58. Talk to your health care provider about which screenings and vaccines you need and how often you need them. This information is not intended to replace  advice given to you by your health care provider. Make sure you discuss any questions you have with your health care provider. Document Released: 12/19/2015 Document Revised: 08/11/2016 Document Reviewed: 09/23/2015 Elsevier Interactive Patient Education  2017 [employer].  Fall Prevention in the Home Falls can cause injuries. They can happen to people of all ages. There are many things you can do to make your home safe and to help prevent falls. What can I do on the outside of my home? Regularly fix the edges of walkways and driveways and fix any cracks. Remove anything that might make you trip as you walk through a door, such as a raised step or threshold. Trim any bushes or trees on the path to your home. Use bright outdoor lighting. Clear any walking paths of anything that might make someone trip, such as rocks or tools. Regularly check to see if handrails are loose or broken. Make sure that both sides of any steps have handrails. Any raised decks and porches should have guardrails on the edges. Have any leaves, snow, or ice cleared regularly. Use sand or salt on walking paths during winter. Clean up any spills in your garage right away. This includes oil or grease spills. What can I do in the bathroom? Use night lights. Install grab bars by the toilet and in the tub and shower. Do not use towel bars as grab bars. Use non-skid mats or decals in the tub or shower. If you need to sit down in the shower, use a plastic, non-slip stool. Keep the floor dry. Clean up any water that spills on the floor as soon as it happens. Remove soap buildup in the tub or shower regularly. Attach bath mats securely with double-sided non-slip rug tape. Do not have throw rugs and other things on the floor that can make you trip. What can I do in the bedroom? Use night lights. Make sure that you have a light by your bed that is easy to reach. Do not use any sheets or blankets that are too big for your bed.  They should not hang down onto the floor. Have a firm chair that has side arms. You can use this for support while you get dressed. Do not have throw rugs and other things on the floor that can make you trip. What can I do in the kitchen? Clean up any spills right away. Avoid walking on wet floors. Keep items that you use a lot in easy-to-reach places. If you need to reach something above you, use a strong step stool that has a grab bar. Keep electrical cords out of the way. Do not use floor polish or wax that makes floors slippery. If you must use wax, use non-skid floor wax. Do not have throw rugs and other things on the floor that can make you trip. What can I do with my stairs? Do not leave any items on the stairs. Make sure that there are handrails on both sides of the stairs and use them. Fix handrails that are broken or loose. Make sure that handrails are as long as the stairways. Check any carpeting to make sure that it is firmly attached to the stairs. Fix any carpet that is loose or worn. Avoid having throw  rugs at the top or bottom of the stairs. If you do have throw rugs, attach them to the floor with carpet tape. Make sure that you have a light switch at the top of the stairs and the bottom of the stairs. If you do not have them, ask someone to add them for you. What else can I do to help prevent falls? Wear shoes that: Do not have high heels. Have rubber bottoms. Are comfortable and fit you well. Are closed at the toe. Do not wear sandals. If you use a stepladder: Make sure that it is fully opened. Do not climb a closed stepladder. Make sure that both sides of the stepladder are locked into place. Ask someone to hold it for you, if possible. Clearly mark and make sure that you can see: Any grab bars or handrails. First and last steps. Where the edge of each step is. Use tools that help you move around (mobility aids) if they are needed. These  include: Canes. Walkers. Scooters. Crutches. Turn on the lights when you go into a dark area. Replace any light bulbs as soon as they burn out. Set up your furniture so you have a clear path. Avoid moving your furniture around. If any of your floors are uneven, fix them. If there are any pets around you, be aware of where they are. Review your medicines with your doctor. Some medicines can make you feel dizzy. This can increase your chance of falling. Ask your doctor what other things that you can do to help prevent falls. This information is not intended to replace advice given to you by your health care provider. Make sure you discuss any questions you have with your health care provider. Document Released: 09/18/2009 Document Revised: 04/29/2016 Document Reviewed: 12/27/2014 Elsevier Interactive Patient Education  2017 [employer].

## 2023-06-08 IMAGING — CR DG CHEST 2V
2 series · 2 of 2 positions shown · non-contrast
Comparison: 01/27/2016

CLINICAL DATA: Preop lumbar spine surgery

EXAM:
CHEST - 2 VIEW

[w chest pa]
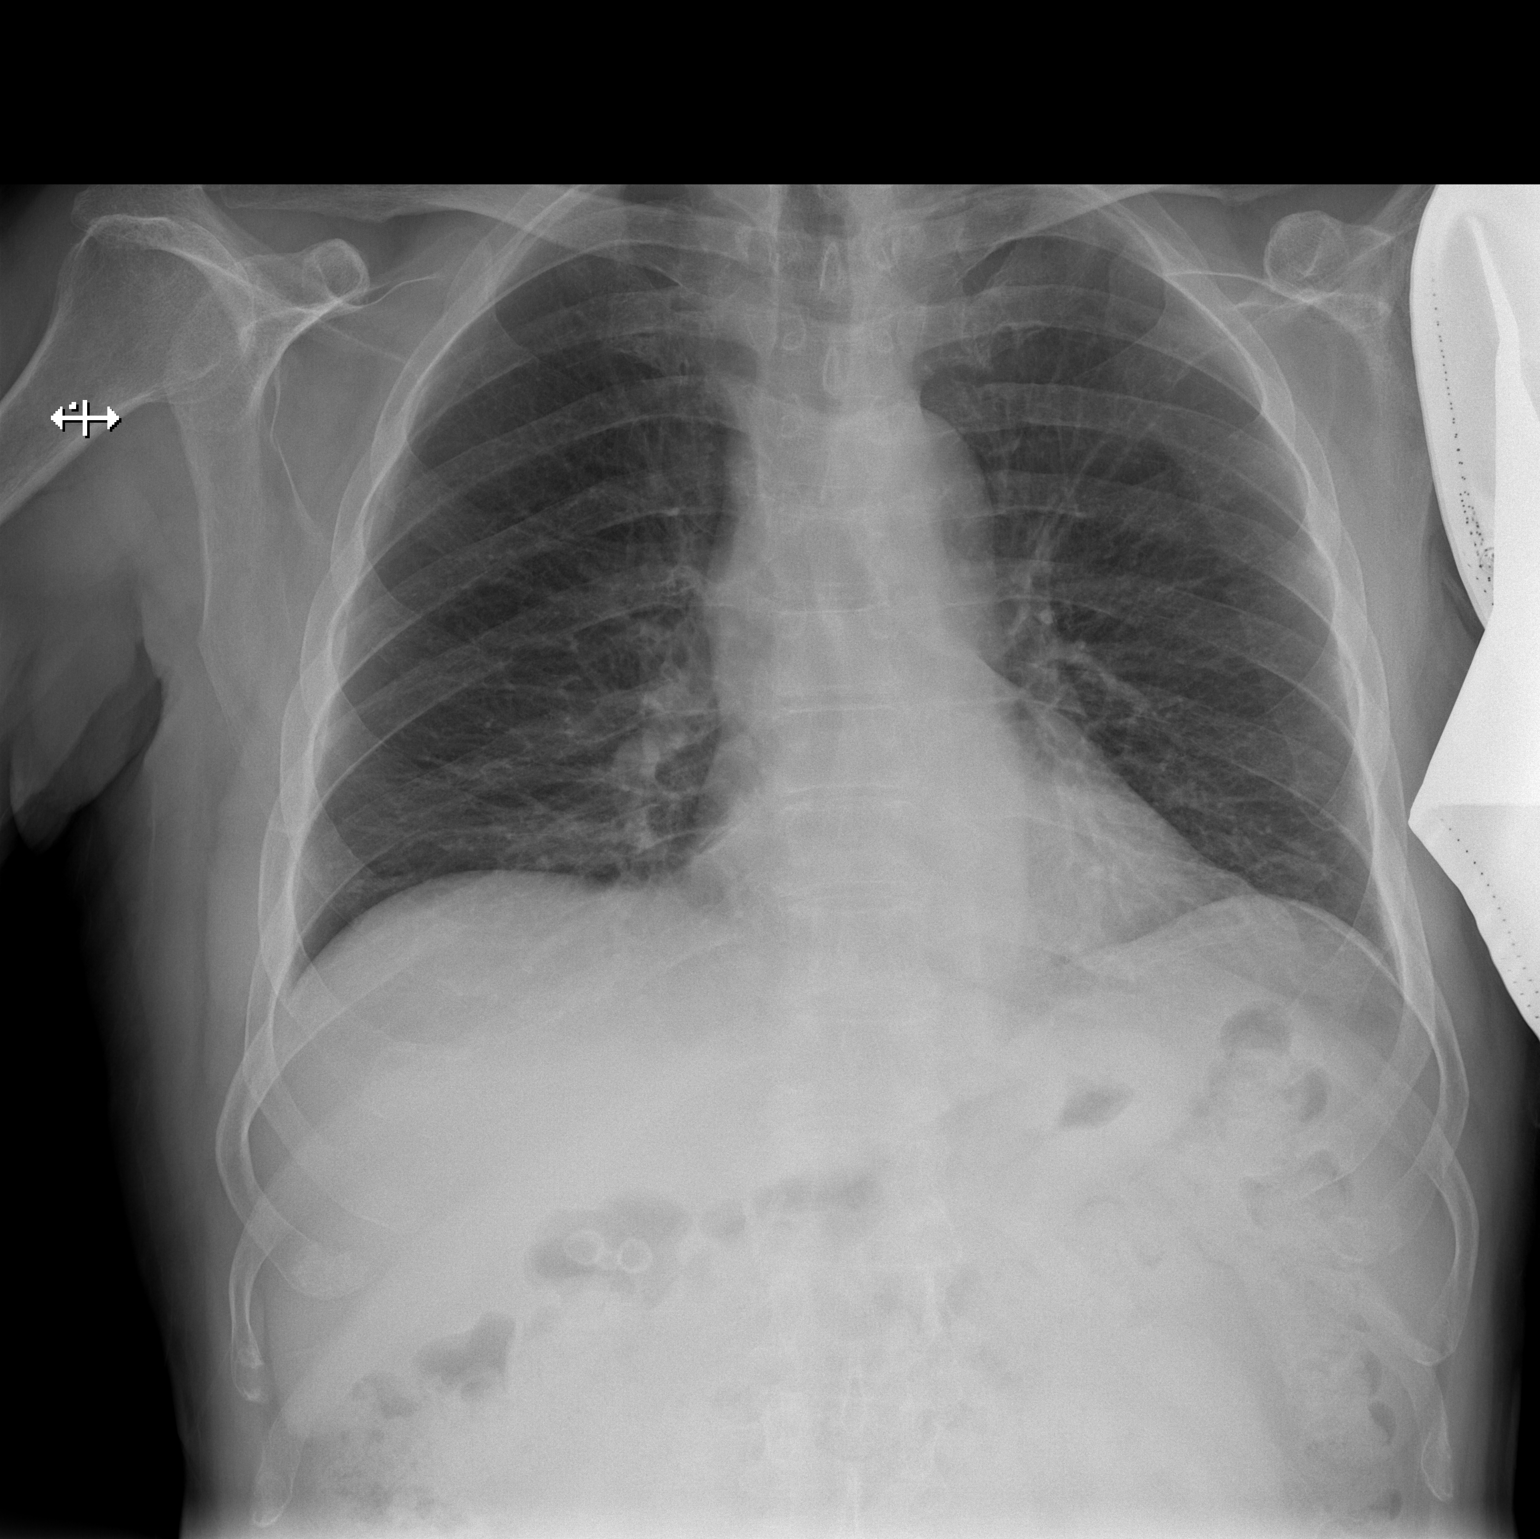

[w chest lat]
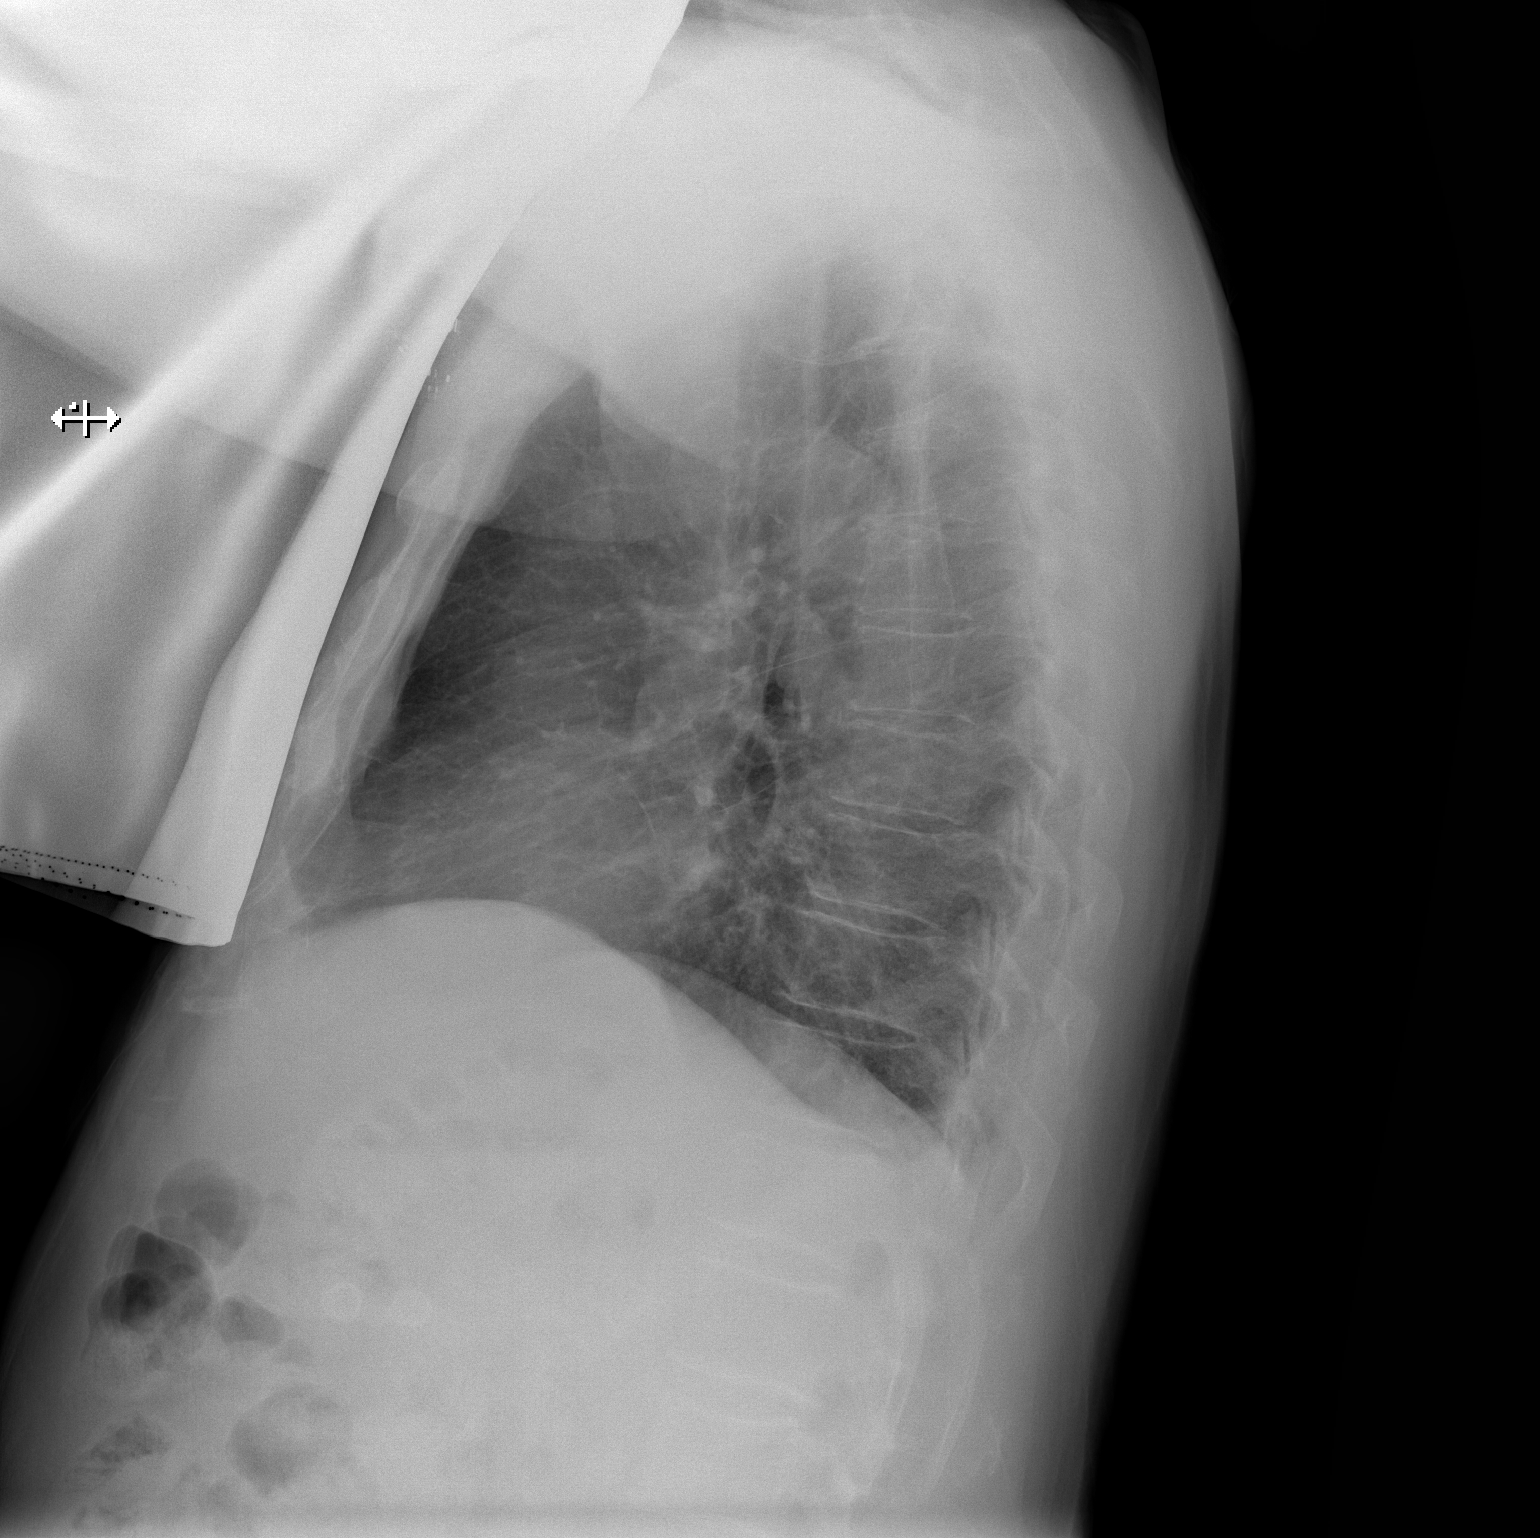

[2 of 2 positions shown; findings below may reference images not displayed]

FINDINGS: Midline trachea. Normal heart size and mediastinal contours. No
pleural effusion or pneumothorax. Mild volume loss at the right lung
base with medial right lower lobe atelectasis.

Curvilinear calcifications project over the right upper abdomen.
IMPRESSION: No acute cardiopulmonary disease.

Suspicion of cholelithiasis.

## 2023-06-15 ENCOUNTER — Ambulatory Visit (INDEPENDENT_AMBULATORY_CARE_PROVIDER_SITE_OTHER): Payer: PPO | Admitting: Family Medicine

## 2023-06-15 ENCOUNTER — Encounter: Payer: Self-pay | Admitting: Family Medicine

## 2023-06-15 VITALS — BP 111/72 | HR 90 | Temp 97.6°F | Ht 68.0 in | Wt 150.2 lb

## 2023-06-15 DIAGNOSIS — E785 Hyperlipidemia, unspecified: Secondary | ICD-10-CM | POA: Diagnosis not present

## 2023-06-15 DIAGNOSIS — E1141 Type 2 diabetes mellitus with diabetic mononeuropathy: Secondary | ICD-10-CM | POA: Diagnosis not present

## 2023-06-15 DIAGNOSIS — I10 Essential (primary) hypertension: Secondary | ICD-10-CM

## 2023-06-15 DIAGNOSIS — Z7985 Long-term (current) use of injectable non-insulin antidiabetic drugs: Secondary | ICD-10-CM

## 2023-06-15 DIAGNOSIS — E039 Hypothyroidism, unspecified: Secondary | ICD-10-CM

## 2023-06-15 LAB — BAYER DCA HB A1C WAIVED: HB A1C (BAYER DCA - WAIVED): 7 % — ABNORMAL HIGH (ref 4.8–5.6)

## 2023-06-15 NOTE — Progress Notes (Signed)
Subjective:  Patient ID: Billy Nolan,  male    DOB: Apr 20, 1946  Age: 77 y.o.    CC: Medical Management of Chronic Issues   HPI Billy Nolan presents for  follow-up of hypertension. Patient has no history of headache chest pain or shortness of breath or recent cough. Patient also denies symptoms of TIA such as numbness weakness lateralizing. Patient denies side effects from medication. States taking it regularly.  Patient also  in for follow-up of elevated cholesterol. Doing well without complaints on current medication. Denies side effects  including myalgia and arthralgia and nausea. Also in today for liver function testing. Currently no chest pain, shortness of breath or other cardiovascular related symptoms noted.  Follow-up of diabetes. Patient does check blood sugar at home. Readings run between 136 fasting  and 250 prandial Patient denies symptoms such as excessive hunger or urinary frequency, excessive hunger, nausea No significant hypoglycemic spells noted. Medications reviewed. Pt reports taking them regularly. Pt. denies complication/adverse reaction today.    follow-up on  thyroid. The patient has a history of hypothyroidism for many years. It has been stable recently. Pt. denies any change in  voice, loss of hair, heat or cold intolerance. Energy level has been adequate to good. Patient denies constipation and diarrhea. No myxedema. Medication is as noted below. Verified that pt is taking it daily on an empty stomach. Well tolerated.    History Billy Nolan has a past medical history of Allergy, Diabetes mellitus without complication (HCC), GERD (gastroesophageal reflux disease), Hyperlipidemia, Hypertension, and Thyroid disease (2023).   Billy Nolan has a past surgical history that includes dental surgeries; Lumbar laminectomy/decompression microdiscectomy (Left, 01/30/2016); Anterior lateral lumbar fusion with percutaneous screw 1 level (N/A, 06/17/2021); and Colonoscopy.   His family  history includes Cancer in his sister; Heart disease in his father and mother; Multiple sclerosis in his sister; Stroke in his father; Ulcerative colitis in his son.Billy Nolan reports that Billy Nolan has never smoked. Billy Nolan has never used smokeless tobacco. Billy Nolan reports that Billy Nolan does not drink alcohol and does not use drugs.  Current Outpatient Medications on File Prior to Visit  Medication Sig Dispense Refill   Continuous Glucose Sensor (FREESTYLE LIBRE 2 SENSOR) MISC USE TO CHECK BLOOD SUGAR 6 TIMES DAILY 6 each 1   dapagliflozin propanediol (FARXIGA) 10 MG TABS tablet Take 1 tablet (10 mg total) by mouth daily before breakfast. 90 tablet 5   famotidine (PEPCID) 20 MG tablet Take 1 tablet (20 mg total) by mouth daily. For reflux / heartburn 90 tablet 3   levothyroxine (SYNTHROID) 50 MCG tablet Take 1 tablet by mouth once daily 90 tablet 3   mometasone (ELOCON) 0.1 % cream Apply 1 Application topically daily. 45 g 0   pantoprazole (PROTONIX) 40 MG tablet Take 1 tablet (40 mg total) by mouth daily. 90 tablet 3   rosuvastatin (CRESTOR) 20 MG tablet Take 1 tablet (20 mg total) by mouth daily. 90 tablet 3   Semaglutide, 1 MG/DOSE, (OZEMPIC, 1 MG/DOSE,) 4 MG/3ML SOPN Inject 1 mg into the skin once a week. 3 mL 5   No current facility-administered medications on file prior to visit.    ROS Review of Systems  Constitutional:  Negative for fever.  Respiratory:  Negative for shortness of breath.   Cardiovascular:  Negative for chest pain.  Musculoskeletal:  Negative for arthralgias.  Skin:  Negative for rash.    Objective:  BP 111/72   Pulse 90   Temp 97.6 F (36.4 C)   Ht  5\' 8"  (1.727 m)   Wt 150 lb 3.2 oz (68.1 kg)   SpO2 97%   BMI 22.84 kg/m   BP Readings from Last 3 Encounters:  06/15/23 111/72  03/15/23 119/66  12/16/22 109/70    Wt Readings from Last 3 Encounters:  06/15/23 150 lb 3.2 oz (68.1 kg)  05/31/23 147 lb (66.7 kg)  03/15/23 151 lb 6.4 oz (68.7 kg)     Physical Exam Vitals  reviewed.  Constitutional:      Appearance: Billy Nolan is well-developed.  HENT:     Head: Normocephalic and atraumatic.     Right Ear: External ear normal.     Left Ear: External ear normal.     Mouth/Throat:     Pharynx: No oropharyngeal exudate or posterior oropharyngeal erythema.  Eyes:     Pupils: Pupils are equal, round, and reactive to light.  Cardiovascular:     Rate and Rhythm: Normal rate and regular rhythm.     Heart sounds: No murmur heard. Pulmonary:     Effort: No respiratory distress.     Breath sounds: Normal breath sounds.  Musculoskeletal:     Cervical back: Normal range of motion and neck supple.  Neurological:     Mental Status: Billy Nolan is alert and oriented to person, place, and time.     Diabetic Foot Exam - Simple   No data filed     Lab Results  Component Value Date   HGBA1C 6.9 (H) 03/15/2023   HGBA1C 7.0 (H) 12/16/2022   HGBA1C 7.3 (H) 09/15/2022    Assessment & Plan:   Billy Nolan was seen today for medical management of chronic issues.  Diagnoses and all orders for this visit:  Hypothyroidism, unspecified type -     TSH + free T4  Type 2 diabetes mellitus with diabetic mononeuropathy, without long-term current use of insulin (HCC) -     Bayer DCA Hb A1c Waived  Essential hypertension -     CBC with Differential/Platelet -     CMP14+EGFR  Hyperlipidemia, unspecified hyperlipidemia type -     Lipid panel   I am having Billy Nolan maintain his Ozempic (1 MG/DOSE), levothyroxine, famotidine, pantoprazole, rosuvastatin, dapagliflozin propanediol, mometasone, and FreeStyle Libre 2 Sensor.  No orders of the defined types were placed in this encounter.    Follow-up: Return in about 3 months (around 09/15/2023).  Mechele Claude, M.D.

## 2023-06-16 LAB — CMP14+EGFR
ALT: 34 IU/L (ref 0–44)
AST: 24 IU/L (ref 0–40)
Albumin: 4.6 g/dL (ref 3.8–4.8)
Alkaline Phosphatase: 73 IU/L (ref 44–121)
BUN/Creatinine Ratio: 12 (ref 10–24)
BUN: 12 mg/dL (ref 8–27)
Bilirubin Total: 2.3 mg/dL — ABNORMAL HIGH (ref 0.0–1.2)
CO2: 23 mmol/L (ref 20–29)
Calcium: 9.9 mg/dL (ref 8.6–10.2)
Chloride: 102 mmol/L (ref 96–106)
Creatinine, Ser: 0.98 mg/dL (ref 0.76–1.27)
Globulin, Total: 2.3 g/dL (ref 1.5–4.5)
Glucose: 149 mg/dL — ABNORMAL HIGH (ref 70–99)
Potassium: 5.1 mmol/L (ref 3.5–5.2)
Sodium: 138 mmol/L (ref 134–144)
Total Protein: 6.9 g/dL (ref 6.0–8.5)
eGFR: 80 mL/min/{1.73_m2} (ref >59)

## 2023-06-16 LAB — LIPID PANEL
Chol/HDL Ratio: 3.3 ratio (ref 0.0–5.0)
Cholesterol, Total: 133 mg/dL (ref 100–199)
HDL: 40 mg/dL (ref >39)
LDL Chol Calc (NIH): 66 mg/dL (ref 0–99)
Triglycerides: 154 mg/dL — ABNORMAL HIGH (ref 0–149)
VLDL Cholesterol Cal: 27 mg/dL (ref 5–40)

## 2023-06-16 LAB — TSH+FREE T4
Free T4: 1.58 ng/dL (ref 0.82–1.77)
TSH: 2.33 u[IU]/mL (ref 0.450–4.500)

## 2023-06-16 LAB — CBC WITH DIFFERENTIAL/PLATELET
Basophils Absolute: 0.1 10*3/uL (ref 0.0–0.2)
Basos: 1 %
EOS (ABSOLUTE): 0.2 10*3/uL (ref 0.0–0.4)
Eos: 4 %
Hematocrit: 52 % — ABNORMAL HIGH (ref 37.5–51.0)
Hemoglobin: 17.6 g/dL (ref 13.0–17.7)
Immature Grans (Abs): 0 10*3/uL (ref 0.0–0.1)
Immature Granulocytes: 0 %
Lymphocytes Absolute: 1.7 10*3/uL (ref 0.7–3.1)
Lymphs: 31 %
MCH: 30.8 pg (ref 26.6–33.0)
MCHC: 33.8 g/dL (ref 31.5–35.7)
MCV: 91 fL (ref 79–97)
Monocytes Absolute: 0.4 10*3/uL (ref 0.1–0.9)
Monocytes: 7 %
Neutrophils Absolute: 3.1 10*3/uL (ref 1.4–7.0)
Neutrophils: 57 %
Platelets: 181 10*3/uL (ref 150–450)
RBC: 5.72 x10E6/uL (ref 4.14–5.80)
RDW: 12.4 % (ref 11.6–15.4)
WBC: 5.5 10*3/uL (ref 3.4–10.8)

## 2023-06-18 NOTE — Progress Notes (Signed)
Hello Randon,  Your lab result is normal and/or stable.Some minor variations that are not significant are commonly marked abnormal, but do not represent any medical problem for you.  Best regards, Dacie Mandel, M.D.

## 2023-09-21 ENCOUNTER — Telehealth: Payer: Self-pay | Admitting: Family Medicine

## 2023-09-22 ENCOUNTER — Ambulatory Visit: Payer: PPO | Admitting: Family Medicine

## 2023-09-22 ENCOUNTER — Encounter: Payer: Self-pay | Admitting: Family Medicine

## 2023-09-22 VITALS — BP 128/76 | HR 87 | Temp 97.3°F | Ht 68.0 in | Wt 152.4 lb

## 2023-09-22 DIAGNOSIS — I10 Essential (primary) hypertension: Secondary | ICD-10-CM

## 2023-09-22 DIAGNOSIS — E039 Hypothyroidism, unspecified: Secondary | ICD-10-CM

## 2023-09-22 DIAGNOSIS — Z7985 Long-term (current) use of injectable non-insulin antidiabetic drugs: Secondary | ICD-10-CM | POA: Diagnosis not present

## 2023-09-22 DIAGNOSIS — E785 Hyperlipidemia, unspecified: Secondary | ICD-10-CM | POA: Diagnosis not present

## 2023-09-22 DIAGNOSIS — E1141 Type 2 diabetes mellitus with diabetic mononeuropathy: Secondary | ICD-10-CM | POA: Diagnosis not present

## 2023-09-22 LAB — BAYER DCA HB A1C WAIVED: HB A1C (BAYER DCA - WAIVED): 7 % — ABNORMAL HIGH (ref 4.8–5.6)

## 2023-09-22 NOTE — Progress Notes (Signed)
Subjective:  Patient ID: Billy Nolan,  male    DOB: 11-Feb-1946  Age: 77 y.o.    CC: Medical Management of Chronic Issues   HPI Billy Nolan presents for  follow-up of hypertension. Patient has no history of headache chest pain or shortness of breath or recent cough. Patient also denies symptoms of TIA such as numbness weakness lateralizing. Patient denies side effects from medication. States taking it regularly.  Patient also  in for follow-up of elevated cholesterol. Doing well without complaints on current medication. Denies side effects  including myalgia and arthralgia and nausea. Also in today for liver function testing. Currently no chest pain, shortness of breath or other cardiovascular related symptoms noted.  Follow-up of diabetes. Patient does check blood sugar at home. Readings run between 126 and 290 postprandial. Working with wife on low carb diet. Eating thin sliced white bread on sandwiches. Patient denies symptoms such as excessive hunger or urinary frequency, excessive hunger, nausea No significant hypoglycemic spells noted. Medications reviewed. Pt reports taking them regularly. Pt. denies complication/adverse reaction today.    History Billy Nolan has a past medical history of Allergy, Diabetes mellitus without complication (HCC), GERD (gastroesophageal reflux disease), Hyperlipidemia, Hypertension, and Thyroid disease (2023).   Billy Nolan has a past surgical history that includes dental surgeries; Lumbar laminectomy/decompression microdiscectomy (Left, 01/30/2016); Anterior lateral lumbar fusion with percutaneous screw 1 level (N/A, 06/17/2021); and Colonoscopy.   His family history includes Cancer in his sister; Heart disease in his father and mother; Multiple sclerosis in his sister; Stroke in his father; Ulcerative colitis in his son.Billy Nolan reports that Billy Nolan has never smoked. Billy Nolan has never used smokeless tobacco. Billy Nolan reports that Billy Nolan does not drink alcohol and does not use  drugs.  Current Outpatient Medications on File Prior to Visit  Medication Sig Dispense Refill   Continuous Glucose Sensor (FREESTYLE LIBRE 2 SENSOR) MISC USE TO CHECK BLOOD SUGAR 6 TIMES DAILY 6 each 1   dapagliflozin propanediol (FARXIGA) 10 MG TABS tablet Take 1 tablet (10 mg total) by mouth daily before breakfast. 90 tablet 5   famotidine (PEPCID) 20 MG tablet Take 1 tablet (20 mg total) by mouth daily. For reflux / heartburn 90 tablet 3   levothyroxine (SYNTHROID) 50 MCG tablet Take 1 tablet by mouth once daily 90 tablet 3   mometasone (ELOCON) 0.1 % cream Apply 1 Application topically daily. 45 g 0   pantoprazole (PROTONIX) 40 MG tablet Take 1 tablet (40 mg total) by mouth daily. 90 tablet 3   rosuvastatin (CRESTOR) 20 MG tablet Take 1 tablet (20 mg total) by mouth daily. 90 tablet 3   Semaglutide, 1 MG/DOSE, (OZEMPIC, 1 MG/DOSE,) 4 MG/3ML SOPN Inject 1 mg into the skin once a week. 3 mL 5   No current facility-administered medications on file prior to visit.    ROS Review of Systems  Constitutional:  Negative for fever.  Respiratory:  Negative for shortness of breath.   Cardiovascular:  Negative for chest pain.  Musculoskeletal:  Negative for arthralgias.  Skin:  Negative for rash.    Objective:  BP 128/76   Pulse 87   Temp (!) 97.3 F (36.3 C)   Ht 5\' 8"  (1.727 m)   Wt 152 lb 6.4 oz (69.1 kg)   SpO2 97%   BMI 23.17 kg/m   BP Readings from Last 3 Encounters:  09/22/23 128/76  06/15/23 111/72  03/15/23 119/66    Wt Readings from Last 3 Encounters:  09/22/23 152 lb 6.4 oz (69.1  kg)  06/15/23 150 lb 3.2 oz (68.1 kg)  05/31/23 147 lb (66.7 kg)     Physical Exam Vitals reviewed.  Constitutional:      Appearance: Billy Nolan is well-developed.  HENT:     Head: Normocephalic and atraumatic.     Right Ear: External ear normal.     Left Ear: External ear normal.     Mouth/Throat:     Pharynx: No oropharyngeal exudate or posterior oropharyngeal erythema.  Eyes:      Pupils: Pupils are equal, round, and reactive to light.  Cardiovascular:     Rate and Rhythm: Normal rate and regular rhythm.     Heart sounds: No murmur heard. Pulmonary:     Effort: No respiratory distress.     Breath sounds: Normal breath sounds.  Musculoskeletal:     Cervical back: Normal range of motion and neck supple.  Neurological:     Mental Status: Billy Nolan is alert and oriented to person, place, and time.     Diabetic Foot Exam - Simple   No data filed     Lab Results  Component Value Date   HGBA1C 7.0 (H) 06/15/2023   HGBA1C 6.9 (H) 03/15/2023   HGBA1C 7.0 (H) 12/16/2022    Assessment & Plan:   Billy Nolan was seen today for medical management of chronic issues.  Diagnoses and all orders for this visit:  Hypothyroidism, unspecified type -     TSH + free T4  Type 2 diabetes mellitus with diabetic mononeuropathy, without long-term current use of insulin (HCC) -     Bayer DCA Hb A1c Waived  Essential hypertension -     CBC with Differential/Platelet -     CMP14+EGFR  Hyperlipidemia, unspecified hyperlipidemia type -     Lipid panel   I am having Billy Nolan maintain his Ozempic (1 MG/DOSE), levothyroxine, famotidine, pantoprazole, rosuvastatin, dapagliflozin propanediol, mometasone, and FreeStyle Libre 2 Sensor.  No orders of the defined types were placed in this encounter.    Follow-up: Return in about 3 months (around 12/23/2023).  Mechele Claude, M.D.

## 2023-09-23 LAB — CBC WITH DIFFERENTIAL/PLATELET
Basophils Absolute: 0.1 10*3/uL (ref 0.0–0.2)
Basos: 1 %
EOS (ABSOLUTE): 0.2 10*3/uL (ref 0.0–0.4)
Eos: 3 %
Hematocrit: 54.4 % — ABNORMAL HIGH (ref 37.5–51.0)
Hemoglobin: 18.2 g/dL — ABNORMAL HIGH (ref 13.0–17.7)
Immature Grans (Abs): 0 10*3/uL (ref 0.0–0.1)
Immature Granulocytes: 0 %
Lymphocytes Absolute: 2 10*3/uL (ref 0.7–3.1)
Lymphs: 32 %
MCH: 31.5 pg (ref 26.6–33.0)
MCHC: 33.5 g/dL (ref 31.5–35.7)
MCV: 94 fL (ref 79–97)
Monocytes Absolute: 0.4 10*3/uL (ref 0.1–0.9)
Monocytes: 7 %
Neutrophils Absolute: 3.4 10*3/uL (ref 1.4–7.0)
Neutrophils: 57 %
Platelets: 207 10*3/uL (ref 150–450)
RBC: 5.77 x10E6/uL (ref 4.14–5.80)
RDW: 13.1 % (ref 11.6–15.4)
WBC: 6 10*3/uL (ref 3.4–10.8)

## 2023-09-23 LAB — TSH+FREE T4
Free T4: 1.68 ng/dL (ref 0.82–1.77)
TSH: 1.81 u[IU]/mL (ref 0.450–4.500)

## 2023-09-23 LAB — LIPID PANEL
Chol/HDL Ratio: 3.2 {ratio} (ref 0.0–5.0)
Cholesterol, Total: 127 mg/dL (ref 100–199)
HDL: 40 mg/dL (ref >39)
LDL Chol Calc (NIH): 51 mg/dL (ref 0–99)
Triglycerides: 224 mg/dL — ABNORMAL HIGH (ref 0–149)
VLDL Cholesterol Cal: 36 mg/dL (ref 5–40)

## 2023-09-23 LAB — CMP14+EGFR
ALT: 23 [IU]/L (ref 0–44)
AST: 20 [IU]/L (ref 0–40)
Albumin: 4.7 g/dL (ref 3.8–4.8)
Alkaline Phosphatase: 64 [IU]/L (ref 44–121)
BUN/Creatinine Ratio: 11 (ref 10–24)
BUN: 10 mg/dL (ref 8–27)
Bilirubin Total: 3 mg/dL — ABNORMAL HIGH (ref 0.0–1.2)
CO2: 22 mmol/L (ref 20–29)
Calcium: 10.1 mg/dL (ref 8.6–10.2)
Chloride: 102 mmol/L (ref 96–106)
Creatinine, Ser: 0.92 mg/dL (ref 0.76–1.27)
Globulin, Total: 2.1 g/dL (ref 1.5–4.5)
Glucose: 131 mg/dL — ABNORMAL HIGH (ref 70–99)
Potassium: 5 mmol/L (ref 3.5–5.2)
Sodium: 137 mmol/L (ref 134–144)
Total Protein: 6.8 g/dL (ref 6.0–8.5)
eGFR: 87 mL/min/{1.73_m2} (ref >59)

## 2023-09-26 ENCOUNTER — Other Ambulatory Visit: Payer: Self-pay | Admitting: Family Medicine

## 2023-10-06 ENCOUNTER — Ambulatory Visit (HOSPITAL_COMMUNITY)
Admission: RE | Admit: 2023-10-06 | Discharge: 2023-10-06 | Disposition: A | Discharge status: Home / Self Care | Payer: PPO | Source: Ambulatory Visit | Attending: Family Medicine | Admitting: Family Medicine

## 2023-10-06 DIAGNOSIS — R1084 Generalized abdominal pain: Secondary | ICD-10-CM | POA: Diagnosis not present

## 2023-10-06 DIAGNOSIS — R748 Abnormal levels of other serum enzymes: Secondary | ICD-10-CM | POA: Diagnosis not present

## 2023-10-06 DIAGNOSIS — K8020 Calculus of gallbladder without cholecystitis without obstruction: Secondary | ICD-10-CM | POA: Diagnosis not present

## 2023-10-08 ENCOUNTER — Other Ambulatory Visit: Payer: Self-pay | Admitting: Family Medicine

## 2023-10-12 DIAGNOSIS — L309 Dermatitis, unspecified: Secondary | ICD-10-CM | POA: Diagnosis not present

## 2023-10-12 DIAGNOSIS — L570 Actinic keratosis: Secondary | ICD-10-CM | POA: Diagnosis not present

## 2023-10-12 DIAGNOSIS — Z1283 Encounter for screening for malignant neoplasm of skin: Secondary | ICD-10-CM | POA: Diagnosis not present

## 2023-10-12 DIAGNOSIS — D1801 Hemangioma of skin and subcutaneous tissue: Secondary | ICD-10-CM | POA: Diagnosis not present

## 2023-10-20 ENCOUNTER — Telehealth: Payer: Self-pay | Admitting: Pharmacist

## 2023-10-20 ENCOUNTER — Ambulatory Visit (INDEPENDENT_AMBULATORY_CARE_PROVIDER_SITE_OTHER): Payer: PPO | Admitting: Pharmacist

## 2023-10-20 DIAGNOSIS — Z7985 Long-term (current) use of injectable non-insulin antidiabetic drugs: Secondary | ICD-10-CM | POA: Diagnosis not present

## 2023-10-20 DIAGNOSIS — E119 Type 2 diabetes mellitus without complications: Secondary | ICD-10-CM | POA: Diagnosis not present

## 2023-10-20 DIAGNOSIS — E1141 Type 2 diabetes mellitus with diabetic mononeuropathy: Secondary | ICD-10-CM | POA: Diagnosis not present

## 2023-10-20 MED ORDER — DAPAGLIFLOZIN PROPANEDIOL 10 MG PO TABS: 10.0000 mg | ORAL_TABLET | Freq: Every day | ORAL | 5 refills | Status: DC

## 2023-10-20 MED ORDER — FREESTYLE LIBRE 3 PLUS SENSOR MISC: 5 refills | Status: DC

## 2023-10-20 NOTE — Telephone Encounter (Signed)
Can you check on ozempic? Patient's last shipment is from 05/24/23 Ozempic 1mg   ALSO, I submitted the patient's novo online and signed MD portion, however I put the DOB wrong and it woudln't let me go back and fix His dob is 2046-08-19 (I accidentally put 12/26/1943)  Thank you!

## 2023-10-20 NOTE — Progress Notes (Signed)
10/20/2023 Name: Billy Nolan MRN: [id number] DOB: [birth year]  Chief Complaint  Patient presents with   Diabetes    Billy Nolan is a 77 y.o. year old male who was referred for medication management by their primary care provider, Mechele Claude, MD. They presented for a face to face visit today.   They were referred to the pharmacist by their PCP for assistance in managing diabetes and medication access. Patient presents with his wife for re-enrollment and optimization for his diabetes medications.   Subjective:  Care Team: Primary Care Provider: Mechele Claude, MD  Medication Access/Adherence  Current Pharmacy:  [facility] [address], [place] - [address] [facility] [place] [address] Phone: [phone] Fax: [phone]  [facility] - [place], [place] - [address] [place] [address] Phone: [phone] Fax: [phone]  [facility] - [place], [place] - [address] [place] [address] Phone: [phone] Fax: [phone]   Patient reports affordability concerns with their medications: Yes  Patient reports access/transportation concerns to their pharmacy: No  Patient reports adherence concerns with their medications:  No     Diabetes:  Current medications:  Ozempic 1mg , farxiga 10mg  Medications tried in the past: metformin, sitagliptin, Rybelsus  Current glucose readings: FBG<130 Using FSL3 CGM    Patient denies hypoglycemic s/sx including dizziness, shakiness, sweating. Patient denies hyperglycemic symptoms including polyuria, polydipsia, polyphagia, nocturia, neuropathy, blurred vision.  Current meal patterns:  Discussed meal planning options and Plate method for healthy eating Avoid sugary drinks and desserts Incorporate balanced protein, non starchy veggies, 1 serving of carbohydrate with each meal Increase water intake Increase physical activity as able  Current physical  activity: encouraged as able  Current medication access support: novo nordisk for Ozempic, az&me for Farxiga   Objective:  Lab Results  Component Value Date   HGBA1C 7.0 (H) 09/22/2023    Lab Results  Component Value Date   CREATININE 0.92 09/22/2023   BUN 10 09/22/2023   NA 137 09/22/2023   K 5.0 09/22/2023   CL 102 09/22/2023   CO2 22 09/22/2023    Lab Results  Component Value Date   CHOL 127 09/22/2023   HDL 40 09/22/2023   LDLCALC 51 09/22/2023   TRIG 224 (H) 09/22/2023   CHOLHDL 3.2 09/22/2023    Medications Reviewed Today     Reviewed by Danella Maiers, [facility] (Pharmacist) on 11/07/23 at 0741  Med List Status: <None>   Medication Order Taking? Sig Documenting Provider Last Dose Status Informant  Continuous Glucose Sensor (FREESTYLE LIBRE 3 PLUS SENSOR) MISC [id number]  Change sensor every 15 days. DX: E11.65 Mechele Claude, MD  Active   dapagliflozin propanediol (FARXIGA) 10 MG TABS tablet [id number]  Take 1 tablet (10 mg total) by mouth daily before breakfast. Mechele Claude, MD  Active   famotidine (PEPCID) 20 MG tablet [id number] No Take 1 tablet (20 mg total) by mouth daily. For reflux / heartburn Mechele Claude, MD Taking Active   levothyroxine (SYNTHROID) 50 MCG tablet [id number] No Take 1 tablet by mouth once daily Mechele Claude, MD Taking Active   mometasone (ELOCON) 0.1 % cream [id number] No Apply 1 Application topically daily. Mechele Claude, MD Taking Active   pantoprazole (PROTONIX) 40 MG tablet [id number] No Take 1 tablet (40 mg total) by mouth daily. Mechele Claude, MD Taking Active   rosuvastatin (CRESTOR) 20 MG tablet [id number] No Take 1  tablet (20 mg total) by mouth daily. Mechele Claude, MD Taking Active   Semaglutide, 1 MG/DOSE, (OZEMPIC, 1 MG/DOSE,) 4 MG/3ML SOPN [id number] No Inject 1 mg into the skin once a week. Mechele Claude, MD Taking Active               Assessment/Plan:   Diabetes: - Currently controlled - Reviewed long term  cardiovascular and renal outcomes of uncontrolled blood sugar - Reviewed goal A1c, goal fasting, and goal 2 hour post prandial glucose - Recommend to continue current therapy with Ozempic 1mg  weekly and Farxiga 10mg  daily. Continue statin  - Patient denies personal or family history of multiple endocrine neoplasia type 2, medullary thyroid cancer; personal history of pancreatitis or gallbladder disease. - Recommend to check glucose using FSL3 - Re-enrolled for [name] Nordisk PAP online.  AZ&Me PAP faxed to [facility]  Follow Up Plan: as needed  Kieth Brightly, PharmD, BCACP, CPP Clinical Pharmacist, [facility]

## 2023-10-24 NOTE — Telephone Encounter (Signed)
Called to check on shipment of Ozempic. First rep informed me that patients enrollment ends 12/22/23 and renewal can't be submitted until after 11/21/23, so application can be resubmitted then with corrected information.  First rep also proceeded to fix refill request (providers SLN had showed as expired, gave updated exp date). Before confirmation, phone call was dropped on novo's end.   Called back and spoke with a second rep who was unable to assist me due to not knowing patients phone number on file. I gave the same 2 phone numbers from patients chart that were confirmed with the first rep. She says she will fax whatever info I needed to the providers office- which is confirmation that novo nordisk shipment is processing.

## 2023-11-04 ENCOUNTER — Telehealth: Payer: Self-pay

## 2023-11-04 NOTE — Progress Notes (Signed)
Pharmacy Medication Assistance Program Note    01/05/2024  Patient ID: Billy Nolan, male   DOB: [birth year], 77 y.o.   MRN: [id number]     11/04/2023  Outreach Medication One  Manufacturer Medication One Nurse, adult Drugs Farxiga  Dose of Farxiga 10MG   Type of Radiographer, therapeutic Assistance  Date Application Submitted to Manufacturer 11/04/2023  Patient Assistance Determination Approved  Approval Start Date 12/07/2023  Approval End Date 12/05/2024     Renewal approved

## 2023-11-08 ENCOUNTER — Telehealth: Payer: Self-pay | Admitting: Pharmacist

## 2023-11-08 NOTE — Progress Notes (Signed)
Call placed to novo nordisk PAP Ozempic shipment is being processed, but no tracking no. Available yet Will await, sample given to patient

## 2023-11-09 NOTE — Telephone Encounter (Signed)
Received Ozempic shipment and pt is aware ready for pick up.

## 2023-11-23 ENCOUNTER — Telehealth: Payer: Self-pay

## 2023-11-23 NOTE — Progress Notes (Unsigned)
Pharmacy Medication Assistance Program Note    11/23/2023  Patient ID: Billy Nolan, male  DOB: 06/12/1946, 77 y.o.  MRN:  [id number]     11/23/2023  Outreach Medication Two  Manufacturer Medication Two [employer]  Nordisk Drugs Ozempic  Dose of Ozempic 1mg   Type of Sport and exercise psychologist  Date Application Submitted to [employer] 11/23/2023     Renewal submitted online.  Provider pages + part d card sent 12/02/23 Resent part d card 01/06/24

## 2023-12-27 ENCOUNTER — Ambulatory Visit: Payer: PPO | Admitting: Family Medicine

## 2024-01-11 ENCOUNTER — Ambulatory Visit: Payer: PPO | Admitting: Family Medicine

## 2024-01-22 ENCOUNTER — Other Ambulatory Visit: Payer: Self-pay | Admitting: Family Medicine

## 2024-02-16 ENCOUNTER — Ambulatory Visit: Payer: PPO | Admitting: Family Medicine

## 2024-02-16 ENCOUNTER — Encounter: Payer: Self-pay | Admitting: Family Medicine

## 2024-02-16 VITALS — BP 134/76 | HR 70 | Temp 98.0°F | Ht 68.0 in | Wt 147.0 lb

## 2024-02-16 DIAGNOSIS — E039 Hypothyroidism, unspecified: Secondary | ICD-10-CM

## 2024-02-16 DIAGNOSIS — E785 Hyperlipidemia, unspecified: Secondary | ICD-10-CM

## 2024-02-16 DIAGNOSIS — E1141 Type 2 diabetes mellitus with diabetic mononeuropathy: Secondary | ICD-10-CM

## 2024-02-16 DIAGNOSIS — I10 Essential (primary) hypertension: Secondary | ICD-10-CM

## 2024-02-16 DIAGNOSIS — Z7984 Long term (current) use of oral hypoglycemic drugs: Secondary | ICD-10-CM | POA: Diagnosis not present

## 2024-02-16 LAB — BAYER DCA HB A1C WAIVED: HB A1C (BAYER DCA - WAIVED): 7.1 % — ABNORMAL HIGH (ref 4.8–5.6)

## 2024-02-16 MED ORDER — ROSUVASTATIN CALCIUM 20 MG PO TABS: 20.0000 mg | ORAL_TABLET | Freq: Every day | ORAL | 3 refills | Status: AC

## 2024-02-16 MED ORDER — PANTOPRAZOLE SODIUM 40 MG PO TBEC: 40.0000 mg | DELAYED_RELEASE_TABLET | Freq: Every day | ORAL | 3 refills | Status: AC

## 2024-02-16 MED ORDER — LEVOTHYROXINE SODIUM 50 MCG PO TABS: 50.0000 ug | ORAL_TABLET | Freq: Every day | ORAL | 0 refills | Status: DC

## 2024-02-16 NOTE — Progress Notes (Signed)
 Subjective:  Patient ID: Billy Nolan,  male    DOB: 08-09-1946  Age: 78 y.o.    CC: skin spot (Skin spot on left arm that popped up. No itching. )   HPI Stevie Charter presents for  follow-up of hypertension. Patient has no history of headache chest pain or shortness of breath or recent cough. Patient also denies symptoms of TIA such as numbness weakness lateralizing. Patient denies side effects from medication. States taking it regularly.  Patient also  in for follow-up of elevated cholesterol. Doing well without complaints on current medication. Denies side effects  including myalgia and arthralgia and nausea. Also in today for liver function testing. Currently no chest pain, shortness of breath or other cardiovascular related symptoms noted.  Follow-up of diabetes. Patient does check blood sugar at home. Patient denies symptoms such as excessive hunger or urinary frequency, excessive hunger, nausea No significant hypoglycemic spells noted. Medications reviewed. Pt reports taking them regularly. Pt. denies complication/adverse reaction today.   Tremo right hand intermittent, rest   Patient in for follow-up of GERD. Currently asymptomatic taking  PPI daily. There is no chest pain or heartburn. No hematemesis and no melena. No dysphagia or choking. Onset is remote. Progression is stable. Complicating factors, none.    History Patric has a past medical history of Allergy, Diabetes mellitus without complication (HCC), GERD (gastroesophageal reflux disease), Hyperlipidemia, Hypertension, and Thyroid disease (2023).   He has a past surgical history that includes dental surgeries; Lumbar laminectomy/decompression microdiscectomy (Left, 01/30/2016); Anterior lateral lumbar fusion with percutaneous screw 1 level (N/A, 06/17/2021); and Colonoscopy.   His family history includes Cancer in his sister; Heart disease in his father and mother; Multiple sclerosis in his sister; Stroke in his father;  Ulcerative colitis in his son.He reports that he has never smoked. He has never used smokeless tobacco. He reports that he does not drink alcohol and does not use drugs.  Current Outpatient Medications on File Prior to Visit  Medication Sig Dispense Refill   Continuous Glucose Sensor (FREESTYLE LIBRE 3 PLUS SENSOR) MISC Change sensor every 15 days. DX: E11.65 2 each 5   dapagliflozin propanediol (FARXIGA) 10 MG TABS tablet Take 1 tablet (10 mg total) by mouth daily before breakfast. 90 tablet 5   mometasone (ELOCON) 0.1 % cream Apply 1 Application topically daily. 45 g 0   Semaglutide, 1 MG/DOSE, (OZEMPIC, 1 MG/DOSE,) 4 MG/3ML SOPN Inject 1 mg into the skin once a week. 3 mL 5   famotidine (PEPCID) 20 MG tablet Take 1 tablet (20 mg total) by mouth daily. For reflux / heartburn (Patient not taking: Reported on 02/16/2024) 90 tablet 3   No current facility-administered medications on file prior to visit.    ROS Review of Systems  Constitutional: Negative.   HENT: Negative.    Eyes:  Negative for visual disturbance.  Respiratory:  Negative for cough and shortness of breath.   Cardiovascular:  Negative for chest pain and leg swelling.  Gastrointestinal:  Negative for abdominal pain, diarrhea, nausea and vomiting.  Genitourinary:  Negative for difficulty urinating.  Musculoskeletal:  Negative for arthralgias and myalgias.  Skin:  Negative for rash.  Neurological:  Positive for tremors. Negative for headaches.  Psychiatric/Behavioral:  Negative for sleep disturbance.    Objective:  BP 134/76   Pulse 70   Temp 98 F (36.7 C)   Ht 5\' 8"  (1.727 m)   Wt 147 lb (66.7 kg)   SpO2 98%   BMI 22.35 kg/m  BP Readings from Last 3 Encounters:  02/16/24 134/76  09/22/23 128/76  06/15/23 111/72    Wt Readings from Last 3 Encounters:  02/16/24 147 lb (66.7 kg)  09/22/23 152 lb 6.4 oz (69.1 kg)  06/15/23 150 lb 3.2 oz (68.1 kg)     Physical Exam Vitals reviewed.  Constitutional:       Appearance: He is well-developed.  HENT:     Head: Normocephalic and atraumatic.     Right Ear: External ear normal.     Left Ear: External ear normal.     Mouth/Throat:     Pharynx: No oropharyngeal exudate or posterior oropharyngeal erythema.  Eyes:     Pupils: Pupils are equal, round, and reactive to light.  Cardiovascular:     Rate and Rhythm: Normal rate and regular rhythm.     Heart sounds: No murmur heard. Pulmonary:     Effort: No respiratory distress.     Breath sounds: Normal breath sounds.  Musculoskeletal:     Cervical back: Normal range of motion and neck supple.  Neurological:     Mental Status: He is alert and oriented to person, place, and time.     Diabetic Foot Exam - Simple   No data filed     Lab Results  Component Value Date   HGBA1C 7.1 (H) 02/16/2024   HGBA1C 7.0 (H) 09/22/2023   HGBA1C 7.0 (H) 06/15/2023    Assessment & Plan:   Emory was seen today for skin spot.  Diagnoses and all orders for this visit:  Type 2 diabetes mellitus with diabetic mononeuropathy, without long-term current use of insulin (HCC) -     Bayer DCA Hb A1c Waived -     rosuvastatin (CRESTOR) 20 MG tablet; Take 1 tablet (20 mg total) by mouth daily.  Hyperbilirubinemia -     CMP14+EGFR  Hypothyroidism, unspecified type -     TSH + free T4  Essential hypertension -     CBC with Differential/Platelet -     CMP14+EGFR  Hyperlipidemia, unspecified hyperlipidemia type -     Lipid panel -     rosuvastatin (CRESTOR) 20 MG tablet; Take 1 tablet (20 mg total) by mouth daily.  Other orders -     levothyroxine (SYNTHROID) 50 MCG tablet; Take 1 tablet (50 mcg total) by mouth daily. -     pantoprazole (PROTONIX) 40 MG tablet; Take 1 tablet (40 mg total) by mouth daily.   I have changed Aurther Loft Homen's levothyroxine. I am also having him maintain his Ozempic (1 MG/DOSE), famotidine, mometasone, FreeStyle Libre 3 Plus Sensor, dapagliflozin propanediol, pantoprazole, and  rosuvastatin.  Meds ordered this encounter  Medications   levothyroxine (SYNTHROID) 50 MCG tablet    Sig: Take 1 tablet (50 mcg total) by mouth daily.    Dispense:  30 tablet    Refill:  0   pantoprazole (PROTONIX) 40 MG tablet    Sig: Take 1 tablet (40 mg total) by mouth daily.    Dispense:  90 tablet    Refill:  3   rosuvastatin (CRESTOR) 20 MG tablet    Sig: Take 1 tablet (20 mg total) by mouth daily.    Dispense:  90 tablet    Refill:  3     Follow-up: Return in about 3 months (around 05/18/2024).  Mechele Claude, M.D.

## 2024-02-17 ENCOUNTER — Telehealth: Payer: Self-pay

## 2024-02-17 LAB — CMP14+EGFR
ALT: 24 IU/L (ref 0–44)
AST: 18 IU/L (ref 0–40)
Albumin: 4.7 g/dL (ref 3.8–4.8)
Alkaline Phosphatase: 69 IU/L (ref 44–121)
BUN/Creatinine Ratio: 11 (ref 10–24)
BUN: 11 mg/dL (ref 8–27)
Bilirubin Total: 2.8 mg/dL — ABNORMAL HIGH (ref 0.0–1.2)
CO2: 21 mmol/L (ref 20–29)
Calcium: 10.3 mg/dL — ABNORMAL HIGH (ref 8.6–10.2)
Chloride: 101 mmol/L (ref 96–106)
Creatinine, Ser: 1 mg/dL (ref 0.76–1.27)
Globulin, Total: 2.4 g/dL (ref 1.5–4.5)
Glucose: 133 mg/dL — ABNORMAL HIGH (ref 70–99)
Potassium: 5.4 mmol/L — ABNORMAL HIGH (ref 3.5–5.2)
Sodium: 139 mmol/L (ref 134–144)
Total Protein: 7.1 g/dL (ref 6.0–8.5)
eGFR: 78 mL/min/{1.73_m2} (ref >59)

## 2024-02-17 LAB — CBC WITH DIFFERENTIAL/PLATELET
Basophils Absolute: 0.1 10*3/uL (ref 0.0–0.2)
Basos: 1 %
EOS (ABSOLUTE): 0.2 10*3/uL (ref 0.0–0.4)
Eos: 4 %
Hematocrit: 54 % — ABNORMAL HIGH (ref 37.5–51.0)
Hemoglobin: 18.1 g/dL — ABNORMAL HIGH (ref 13.0–17.7)
Immature Grans (Abs): 0 10*3/uL (ref 0.0–0.1)
Immature Granulocytes: 0 %
Lymphocytes Absolute: 1.9 10*3/uL (ref 0.7–3.1)
Lymphs: 30 %
MCH: 31.1 pg (ref 26.6–33.0)
MCHC: 33.5 g/dL (ref 31.5–35.7)
MCV: 93 fL (ref 79–97)
Monocytes Absolute: 0.4 10*3/uL (ref 0.1–0.9)
Monocytes: 6 %
Neutrophils Absolute: 3.6 10*3/uL (ref 1.4–7.0)
Neutrophils: 59 %
Platelets: 196 10*3/uL (ref 150–450)
RBC: 5.82 x10E6/uL — ABNORMAL HIGH (ref 4.14–5.80)
RDW: 13.3 % (ref 11.6–15.4)
WBC: 6.1 10*3/uL (ref 3.4–10.8)

## 2024-02-17 LAB — LIPID PANEL
Chol/HDL Ratio: 3.5 ratio (ref 0.0–5.0)
Cholesterol, Total: 141 mg/dL (ref 100–199)
HDL: 40 mg/dL (ref >39)
LDL Chol Calc (NIH): 72 mg/dL (ref 0–99)
Triglycerides: 172 mg/dL — ABNORMAL HIGH (ref 0–149)
VLDL Cholesterol Cal: 29 mg/dL (ref 5–40)

## 2024-02-17 LAB — TSH+FREE T4
Free T4: 1.93 ng/dL — ABNORMAL HIGH (ref 0.82–1.77)
TSH: 0.784 u[IU]/mL (ref 0.450–4.500)

## 2024-02-17 NOTE — Telephone Encounter (Signed)
 Called patient to inform that Ozempic is in office and ready for pick up. Patient verbalized understanding. [facility] 02/17/2024

## 2024-02-18 ENCOUNTER — Encounter: Payer: Self-pay | Admitting: Family Medicine

## 2024-02-20 ENCOUNTER — Encounter: Payer: Self-pay | Admitting: Family Medicine

## 2024-02-20 ENCOUNTER — Other Ambulatory Visit: Payer: Self-pay | Admitting: Family Medicine

## 2024-02-20 DIAGNOSIS — R17 Unspecified jaundice: Secondary | ICD-10-CM

## 2024-03-06 ENCOUNTER — Ambulatory Visit (HOSPITAL_COMMUNITY)
Admission: RE | Admit: 2024-03-06 | Discharge: 2024-03-06 | Disposition: A | Discharge status: Home / Self Care | Source: Ambulatory Visit | Attending: Family Medicine | Admitting: Family Medicine

## 2024-03-06 DIAGNOSIS — R17 Unspecified jaundice: Secondary | ICD-10-CM | POA: Insufficient documentation

## 2024-03-06 DIAGNOSIS — K8020 Calculus of gallbladder without cholecystitis without obstruction: Secondary | ICD-10-CM | POA: Diagnosis not present

## 2024-03-07 ENCOUNTER — Encounter: Payer: Self-pay | Admitting: Family Medicine

## 2024-03-08 ENCOUNTER — Encounter: Payer: Self-pay | Admitting: Family Medicine

## 2024-03-12 ENCOUNTER — Other Ambulatory Visit: Payer: Self-pay | Admitting: Family Medicine

## 2024-03-12 DIAGNOSIS — R1011 Right upper quadrant pain: Secondary | ICD-10-CM

## 2024-03-12 DIAGNOSIS — K8020 Calculus of gallbladder without cholecystitis without obstruction: Secondary | ICD-10-CM

## 2024-04-10 ENCOUNTER — Ambulatory Visit: Admitting: Surgery

## 2024-04-10 ENCOUNTER — Encounter: Payer: Self-pay | Admitting: Surgery

## 2024-04-10 VITALS — BP 132/80 | HR 86 | Temp 98.0°F | Resp 14 | Ht 68.0 in | Wt 151.0 lb

## 2024-04-10 DIAGNOSIS — K8020 Calculus of gallbladder without cholecystitis without obstruction: Secondary | ICD-10-CM | POA: Diagnosis not present

## 2024-04-10 NOTE — Progress Notes (Signed)
 [facility] History and Physical  Reason for Referral: Cholelithiasis Referring Physician: Dr. Veleta Gerold  Chief Complaint   New Patient (Initial Visit)     Billy Nolan is a 78 y.o. male.  HPI: Patient presents for evaluation of cholelithiasis.  He was first noticed to have an elevated bilirubin about a year ago.  This was monitored by his primary care doctor, and failed to improve, so he underwent an abdominal ultrasound.  His most recent abdominal ultrasound demonstrated cholelithiasis, so he was sent to my office for evaluation.  He states that he is relatively asymptomatic without abdominal pain.  He does get nauseous with some abdominal discomfort after certain foods, but he believes that this is related to his Ozempic .  He mostly gets nausea and diarrhea after he receives his weekly dose of Ozempic .  He has noted dark-colored urine with brown, normal colored stools.  He denies fevers and chills.  He denies ever getting right upper quadrant abdominal pain after eating.  His past medical history significant for diabetes, hypothyroidism, and hyperlipidemia.  He is taking Farxiga  and Ozempic  for his diabetes.  He denies history of abdominal surgeries.  He denies use of blood thinning medications.  He denies use of tobacco products and alcohol.  Past Medical History:  Diagnosis Date   Allergy    Diabetes mellitus without complication (HCC)    GERD (gastroesophageal reflux disease)    Hyperlipidemia    Hypertension    Thyroid  disease 2023   hypothyroidism    Past Surgical History:  Procedure Laterality Date   ANTERIOR LATERAL LUMBAR FUSION WITH PERCUTANEOUS SCREW 1 LEVEL N/A 06/17/2021   Procedure: ANTERIOR LATERAL LUMBAR FUSION WITH PERCUTANEOUS SCREW 1 LEVEL (XLIF with PSFI) L3-4;  Surgeon: Mort Ards, MD;  Location: [facility] OR;  Service: Orthopedics;  Laterality: N/A;  5 hrs Left tap block with exparel  3C bed post op   COLONOSCOPY     dental surgeries     teeth  extractions, root cannal   LUMBAR LAMINECTOMY/DECOMPRESSION MICRODISCECTOMY Left 01/30/2016   Procedure: LUMBAR LAMINECTOMY/CENTRAL DECOMPRESSION MICRODISCECTOMY L3 - L4 ON THE LEFT;  Surgeon: Hazle Lites, MD;  Location: [facility];  Service: Orthopedics;  Laterality: Left;    Family History  Problem Relation Age of Onset   Heart disease Mother    Stroke Father    Heart disease Father    Multiple sclerosis Sister    Cancer Sister    Ulcerative colitis Son    Colon cancer Neg Hx    Rectal cancer Neg Hx    Esophageal cancer Neg Hx    Stomach cancer Neg Hx    Pancreatic cancer Neg Hx     Social History   Tobacco Use   Smoking status: Never   Smokeless tobacco: Never  Vaping Use   Vaping status: Never Used  Substance Use Topics   Alcohol use: No   Drug use: No    Medications: I have reviewed the patient's current medications. Allergies as of 04/10/2024       Reactions   Sulfa Antibiotics Rash        Medication List        Accurate as of Apr 10, 2024  2:08 PM. If you have any questions, ask your nurse or doctor.          STOP taking these medications    famotidine  20 MG tablet Commonly known as: PEPCID  Stopped by: Kymani Shimabukuro A Cordai Rodrigue       TAKE these medications  dapagliflozin  propanediol 10 MG Tabs tablet Commonly known as: FARXIGA  Take 1 tablet (10 mg total) by mouth daily before breakfast.   FreeStyle Libre 3 Plus Sensor Misc Change sensor every 15 days. DX: E11.65   levothyroxine  50 MCG tablet Commonly known as: SYNTHROID  Take 1 tablet (50 mcg total) by mouth daily.   mometasone  0.1 % cream Commonly known as: ELOCON  Apply 1 Application topically daily.   Ozempic  (1 MG/DOSE) 4 MG/3ML Sopn Generic drug: Semaglutide  (1 MG/DOSE) Inject 1 mg into the skin once a week.   pantoprazole  40 MG tablet Commonly known as: PROTONIX  Take 1 tablet (40 mg total) by mouth daily.   rosuvastatin  20 MG tablet Commonly known as: CRESTOR  Take 1 tablet  (20 mg total) by mouth daily.         ROS:  Constitutional: negative for chills, fatigue, and fevers Eyes: negative for visual disturbance and  pain Ears, nose, mouth, throat, and face: positive for sinus problems, negative for ear drainage and sore throat Respiratory: negative for cough, wheezing, and shortness of breath Cardiovascular: negative for chest pain and palpitations Gastrointestinal: positive for nausea and reflux symptoms, negative for abdominal pain and vomiting Genitourinary:negative for dysuria and frequency Integument/breast: positive for dryness and rash Hematologic/lymphatic: negative for bleeding and lymphadenopathy Musculoskeletal:negative for back pain and neck pain Neurological: negative for dizziness and tremors Endocrine: negative for temperature intolerance  Blood pressure 132/80, pulse 86, temperature 98 F (36.7 C), temperature source Oral, resp. rate 14, height 5\' 8"  (1.727 m), weight 151 lb (68.5 kg), SpO2 92%. Physical Exam Vitals reviewed.  Constitutional:      Appearance: Normal appearance.  HENT:     Head: Normocephalic and atraumatic.  Eyes:     Extraocular Movements: Extraocular movements intact.     Pupils: Pupils are equal, round, and reactive to light.  Cardiovascular:     Rate and Rhythm: Normal rate and regular rhythm.  Pulmonary:     Effort: Pulmonary effort is normal.     Breath sounds: Normal breath sounds.  Abdominal:     Comments: Abdomen soft, nondistended, no percussion tenderness, nontender to palpation; no rigidity, guarding, rebound tenderness; negative Murphy sign  Musculoskeletal:        General: Normal range of motion.     Cervical back: Normal range of motion.  Skin:    General: Skin is warm and dry.  Neurological:     General: No focal deficit present.     Mental Status: He is alert and oriented to person, place, and time.  Psychiatric:        Mood and Affect: Mood normal.        Behavior: Behavior normal.      Results: Abdominal Ultrasound (03/06/24): IMPRESSION: 1. Cholelithiasis without sonographic evidence of acute cholecystitis. 2. Increased echotexture of the liver. This is a nonspecific finding but can be seen in fatty infiltration of liver.     Latest Ref Rng & Units 02/16/2024    9:51 AM 09/22/2023    8:22 AM 06/15/2023    9:42 AM  Hepatic Function  Total Protein 6.0 - 8.5 g/dL 7.1  6.8  6.9   Albumin  3.8 - 4.8 g/dL 4.7  4.7  4.6   AST 0 - 40 IU/L 18  20  24    ALT 0 - 44 IU/L 24  23  34   Alk Phosphatase 44 - 121 IU/L 69  64  73   Total Bilirubin 0.0 - 1.2 mg/dL 2.8  3.0  2.3  Assessment & Plan:  Billy Nolan is a 78 y.o. male who presents for evaluation of cholelithiasis.  -We discussed the pathophysiology of gallbladder disease, and the need for removal of the gallbladder.  I discussed with him that it seems like most of his abdominal symptoms are related to his Ozempic  use, as the symptoms always start after he receiving his dose of Ozempic .  We also discussed that his elevated bilirubin is probably more likely related to his liver disease that was noted on ultrasound as opposed to an acute issue with his gallbladder, especially since it has been elevated for greater than 1 year.  However, I would be willing to remove his gallbladder, to see if this does help any of his nausea and abdominal discomfort -I counseled the patient about the indication, risks and benefits of robotic assisted laparoscopic cholecystectomy.  He understands there is a very small chance for bleeding, infection, injury to normal structures (including common bile duct), conversion to open surgery, persistent symptoms, evolution of postcholecystectomy diarrhea, need for secondary interventions, anesthesia reaction, cardiopulmonary issues and other risks not specifically detailed here. I described the expected recovery, the plan for follow-up and the restrictions during the recovery phase.  All questions  were answered. -Patient has decided that he would like to hold off on surgery at this time.  He would like to think about his options and discuss need for surgery with his PCP. -Advised that he should present to the ED if he begins to experience severe right upper quadrant abdominal pain, nausea, vomiting, fever, chills, and jaundice -Information provided to the patient regarding cholelithiasis, cholecystitis, cholecystectomy, and low-fat diet -Follow up with me as needed.  Also advised that he can call the office if he changes his mind and would like to proceed with surgery  All questions were answered to the satisfaction of the patient and family.  Note: Portions of this report may have been transcribed using voice recognition software. Every effort has been made to ensure accuracy; however, inadvertent computerized transcription errors may still be present.   Lidia Reels, DO [facility] [address] [place]ise Barlow [place], [place] [address] [phone] (office)

## 2024-04-18 ENCOUNTER — Other Ambulatory Visit: Payer: Self-pay | Admitting: Family Medicine

## 2024-05-09 DIAGNOSIS — D485 Neoplasm of uncertain behavior of skin: Secondary | ICD-10-CM | POA: Diagnosis not present

## 2024-05-10 ENCOUNTER — Telehealth: Payer: Self-pay

## 2024-05-10 NOTE — Telephone Encounter (Signed)
 Left detailed message stating that we will give them a call once ozempic  is received. LS

## 2024-05-10 NOTE — Telephone Encounter (Signed)
 Copied from CRM [id number]. Topic: Clinical - Medication Question >> May 10, 2024  1:56 PM Lotus Round B wrote: Reason for CRM: pt wife called in to see if the patient Ozempic  shots are at the clinic for them to go pick up . She said she got a call last week that they were ready for pick up but they been so busy and wasn't able to pick it up that day . Called [facility] to check and they said there isnt anything there . Advised wife . She would like to know, if she can get a call when they are ready for pick up

## 2024-05-21 ENCOUNTER — Ambulatory Visit: Admitting: Family Medicine

## 2024-05-21 ENCOUNTER — Encounter: Payer: Self-pay | Admitting: Family Medicine

## 2024-05-21 VITALS — BP 112/68 | HR 85 | Temp 97.9°F | Ht 68.0 in | Wt 148.0 lb

## 2024-05-21 DIAGNOSIS — Z7985 Long-term (current) use of injectable non-insulin antidiabetic drugs: Secondary | ICD-10-CM

## 2024-05-21 DIAGNOSIS — E039 Hypothyroidism, unspecified: Secondary | ICD-10-CM

## 2024-05-21 DIAGNOSIS — E785 Hyperlipidemia, unspecified: Secondary | ICD-10-CM | POA: Diagnosis not present

## 2024-05-21 DIAGNOSIS — R17 Unspecified jaundice: Secondary | ICD-10-CM | POA: Diagnosis not present

## 2024-05-21 DIAGNOSIS — E1141 Type 2 diabetes mellitus with diabetic mononeuropathy: Secondary | ICD-10-CM | POA: Diagnosis not present

## 2024-05-21 DIAGNOSIS — I10 Essential (primary) hypertension: Secondary | ICD-10-CM | POA: Diagnosis not present

## 2024-05-21 LAB — BAYER DCA HB A1C WAIVED: HB A1C (BAYER DCA - WAIVED): 7.3 % — ABNORMAL HIGH (ref 4.8–5.6)

## 2024-05-21 NOTE — Progress Notes (Signed)
 Subjective:  Patient ID: Billy Nolan,  male    DOB: 1946-10-20  Age: 78 y.o.    CC: Medical Management of Chronic Issues (Went to surgeon to discuss having gallbladder removed. Diarrhea off and on, losing weight, low energy and upset stomach after larger meals. Surgeon doesn't know if it would be beneficial. Stone in gallbladder. Might need to see GI.  ) and Medication Problem (Still having trouble getting Ozempic . Last one. Needs refill. Patient assistance is taken care of.)   HPI Billy Nolan presents for  follow-up of  follow-up on  thyroid . The patient has a history of hypothyroidism for many years. It has been stable recently. Pt. denies any change in  voice, loss of hair, heat or cold intolerance. Energy level has been adequate to good. Patient denies constipation and diarrhea. No myxedema. Medication is as noted below. Verified that pt is taking it daily on an empty stomach. Well tolerated.   Patient also  in for follow-up of elevated cholesterol. Doing well without complaints on current medication. Denies side effects  including myalgia and arthralgia and nausea. Also in today for liver function testing. Currently no chest pain, shortness of breath or other cardiovascular related symptoms noted.  Follow-up of diabetes. Patient does check blood sugar at home. Readings run between 100 and 150 on Libre Patient denies symptoms such as excessive hunger or urinary frequency, excessive hunger, nausea No significant hypoglycemic spells noted. Medications reviewed. Pt reports taking them regularly. Pt. denies complication/adverse reaction today.   Concerned about weight loss over three years. 150 a years ago. 153 2 years ago. 159 3 years ago.  History Billy Nolan has a past medical history of Allergy, Diabetes mellitus without complication (HCC), GERD (gastroesophageal reflux disease), Hyperlipidemia, Hypertension, and Thyroid  disease (2023).   He has a past surgical history that includes dental  surgeries; Lumbar laminectomy/decompression microdiscectomy (Left, 01/30/2016); Anterior lateral lumbar fusion with percutaneous screw 1 level (N/A, 06/17/2021); and Colonoscopy.   His family history includes Cancer in his sister; Heart disease in his father and mother; Multiple sclerosis in his sister; Stroke in his father; Ulcerative colitis in his son.He reports that he has never smoked. He has never used smokeless tobacco. He reports that he does not drink alcohol and does not use drugs.  Current Outpatient Medications on File Prior to Visit  Medication Sig Dispense Refill   Continuous Glucose Sensor (FREESTYLE LIBRE 3 PLUS SENSOR) MISC Change sensor every 15 days. DX: E11.65 2 each 5   dapagliflozin  propanediol (FARXIGA ) 10 MG TABS tablet Take 1 tablet (10 mg total) by mouth daily before breakfast. 90 tablet 5   levothyroxine  (SYNTHROID ) 50 MCG tablet Take 1 tablet by mouth once daily 30 tablet 5   pantoprazole  (PROTONIX ) 40 MG tablet Take 1 tablet (40 mg total) by mouth daily. 90 tablet 3   rosuvastatin  (CRESTOR ) 20 MG tablet Take 1 tablet (20 mg total) by mouth daily. 90 tablet 3   Semaglutide , 1 MG/DOSE, (OZEMPIC , 1 MG/DOSE,) 4 MG/3ML SOPN Inject 1 mg into the skin once a week. 3 mL 5   No current facility-administered medications on file prior to visit.    ROS Review of Systems  Constitutional:  Negative for fever.  Respiratory:  Negative for shortness of breath.   Cardiovascular:  Negative for chest pain.  Musculoskeletal:  Negative for arthralgias.  Skin:  Negative for rash.    Objective:  BP 112/68   Pulse 85   Temp 97.9 F (36.6 C)   Ht  5' 8 (1.727 m)   Wt 148 lb (67.1 kg)   SpO2 98%   BMI 22.50 kg/m   BP Readings from Last 3 Encounters:  05/21/24 112/68  04/10/24 132/80  02/16/24 134/76    Wt Readings from Last 3 Encounters:  05/21/24 148 lb (67.1 kg)  04/10/24 151 lb (68.5 kg)  02/16/24 147 lb (66.7 kg)    Lab Results  Component Value Date   HGBA1C  7.1 (H) 02/16/2024   HGBA1C 7.0 (H) 09/22/2023   HGBA1C 7.0 (H) 06/15/2023    Physical Exam Vitals reviewed.  Constitutional:      Appearance: He is well-developed.  HENT:     Head: Normocephalic and atraumatic.     Right Ear: External ear normal.     Left Ear: External ear normal.     Mouth/Throat:     Pharynx: No oropharyngeal exudate or posterior oropharyngeal erythema.   Eyes:     Pupils: Pupils are equal, round, and reactive to light.    Cardiovascular:     Rate and Rhythm: Normal rate and regular rhythm.     Heart sounds: No murmur heard. Pulmonary:     Effort: No respiratory distress.     Breath sounds: Normal breath sounds.   Musculoskeletal:     Cervical back: Normal range of motion and neck supple.   Neurological:     Mental Status: He is alert and oriented to person, place, and time.         Assessment & Plan:  Type 2 diabetes mellitus with diabetic mononeuropathy, without long-term current use of insulin  (HCC) -     Bayer DCA Hb A1c Waived  Elevated bilirubin -     CMP14+EGFR -     Ambulatory referral to Gastroenterology  Hypothyroidism, unspecified type -     TSH + free T4  Hyperlipidemia, unspecified hyperlipidemia type -     CMP14+EGFR -     Lipid panel  Essential hypertension -     CBC with Differential/Platelet    Follow-up: Return in about 3 months (around 08/21/2024).  Roise Cleaver, M.D.

## 2024-05-21 NOTE — Patient Instructions (Signed)
 For Hearing loss, I recommend [facility] on [address], [address], in [place]

## 2024-05-22 ENCOUNTER — Ambulatory Visit: Payer: Self-pay | Admitting: Family Medicine

## 2024-05-22 LAB — CMP14+EGFR
ALT: 19 IU/L (ref 0–44)
AST: 16 IU/L (ref 0–40)
Albumin: 4.5 g/dL (ref 3.8–4.8)
Alkaline Phosphatase: 67 IU/L (ref 44–121)
BUN/Creatinine Ratio: 15 (ref 10–24)
BUN: 13 mg/dL (ref 8–27)
Bilirubin Total: 1.7 mg/dL — ABNORMAL HIGH (ref 0.0–1.2)
CO2: 21 mmol/L (ref 20–29)
Calcium: 10 mg/dL (ref 8.6–10.2)
Chloride: 101 mmol/L (ref 96–106)
Creatinine, Ser: 0.88 mg/dL (ref 0.76–1.27)
Globulin, Total: 2.3 g/dL (ref 1.5–4.5)
Glucose: 148 mg/dL — ABNORMAL HIGH (ref 70–99)
Potassium: 4.6 mmol/L (ref 3.5–5.2)
Sodium: 140 mmol/L (ref 134–144)
Total Protein: 6.8 g/dL (ref 6.0–8.5)
eGFR: 89 mL/min/{1.73_m2} (ref >59)

## 2024-05-22 LAB — CBC WITH DIFFERENTIAL/PLATELET
Basophils Absolute: 0.1 10*3/uL (ref 0.0–0.2)
Basos: 1 %
EOS (ABSOLUTE): 0.2 10*3/uL (ref 0.0–0.4)
Eos: 3 %
Hematocrit: 53.5 % — ABNORMAL HIGH (ref 37.5–51.0)
Hemoglobin: 18.2 g/dL — ABNORMAL HIGH (ref 13.0–17.7)
Immature Grans (Abs): 0 10*3/uL (ref 0.0–0.1)
Immature Granulocytes: 0 %
Lymphocytes Absolute: 1.6 10*3/uL (ref 0.7–3.1)
Lymphs: 27 %
MCH: 31.4 pg (ref 26.6–33.0)
MCHC: 34 g/dL (ref 31.5–35.7)
MCV: 92 fL (ref 79–97)
Monocytes Absolute: 0.4 10*3/uL (ref 0.1–0.9)
Monocytes: 7 %
Neutrophils Absolute: 3.6 10*3/uL (ref 1.4–7.0)
Neutrophils: 62 %
Platelets: 218 10*3/uL (ref 150–450)
RBC: 5.8 x10E6/uL (ref 4.14–5.80)
RDW: 12.9 % (ref 11.6–15.4)
WBC: 5.8 10*3/uL (ref 3.4–10.8)

## 2024-05-22 LAB — TSH+FREE T4
Free T4: 1.57 ng/dL (ref 0.82–1.77)
TSH: 1.24 u[IU]/mL (ref 0.450–4.500)

## 2024-05-22 LAB — LIPID PANEL
Chol/HDL Ratio: 4 ratio (ref 0.0–5.0)
Cholesterol, Total: 137 mg/dL (ref 100–199)
HDL: 34 mg/dL — ABNORMAL LOW (ref >39)
LDL Chol Calc (NIH): 73 mg/dL (ref 0–99)
Triglycerides: 176 mg/dL — ABNORMAL HIGH (ref 0–149)
VLDL Cholesterol Cal: 30 mg/dL (ref 5–40)

## 2024-05-22 NOTE — Progress Notes (Signed)
Hello [name],  Your lab result is normal and/or stable.Some minor variations that are not significant are commonly marked abnormal, but do not represent any medical problem for you.  Best regards, Mechele Claude, M.D.

## 2024-05-23 ENCOUNTER — Telehealth: Payer: Self-pay

## 2024-05-23 NOTE — Telephone Encounter (Signed)
 Pt made aware that Ozempic  4mg  #4 boxes were received from pt assistance. Will come by and pick up.

## 2024-05-26 ENCOUNTER — Encounter: Payer: Self-pay | Admitting: Family Medicine

## 2024-05-31 ENCOUNTER — Ambulatory Visit (INDEPENDENT_AMBULATORY_CARE_PROVIDER_SITE_OTHER): Payer: PPO

## 2024-05-31 VITALS — BP 112/68 | HR 85 | Ht 68.0 in | Wt 148.0 lb

## 2024-05-31 DIAGNOSIS — C44629 Squamous cell carcinoma of skin of left upper limb, including shoulder: Secondary | ICD-10-CM | POA: Diagnosis not present

## 2024-05-31 DIAGNOSIS — Z0000 Encounter for general adult medical examination without abnormal findings: Secondary | ICD-10-CM

## 2024-05-31 DIAGNOSIS — L570 Actinic keratosis: Secondary | ICD-10-CM | POA: Diagnosis not present

## 2024-05-31 NOTE — Progress Notes (Addendum)
 Subjective:   Billy Nolan is a 78 y.o. who presents for a Medicare Wellness preventive visit.  As a reminder, Annual Wellness Visits don't include a physical exam, and some assessments may be limited, especially if this visit is performed virtually. We may recommend an in-person follow-up visit with your provider if needed.  Visit Complete: Virtual I connected with  Billy Nolan on 05/31/24 by a audio enabled telemedicine application and verified that I am speaking with the correct person using two identifiers.  Patient Location: Home  Provider Location: Home Office  I discussed the limitations of evaluation and management by telemedicine. The patient expressed understanding and agreed to proceed.  Vital Signs: Because this visit was a virtual/telehealth visit, some criteria may be missing or patient reported. Any vitals not documented were not able to be obtained and vitals that have been documented are patient reported.  VideoDeclined- This patient declined Librarian, academic. Therefore the visit was completed with audio only.   Persons Participating in Visit: Patient's visit was assisted per wife, Arland.  AWV Questionnaire: No: Patient Medicare AWV questionnaire was not completed prior to this visit.  Cardiac Risk Factors include: advanced age (>12men, >90 women);diabetes mellitus;dyslipidemia;male gender     Objective:    Today's Vitals   05/31/24 1000  BP: 112/68  Pulse: 85  Weight: 148 lb (67.1 kg)  Height: 5' 8 (1.727 m)   Body mass index is 22.5 kg/m.     05/31/2024   10:22 AM 05/31/2023    1:29 PM 05/28/2022    1:30 PM 06/15/2021    1:29 PM 05/25/2021    9:01 AM 05/22/2020    8:44 AM 04/26/2019   11:48 AM  Advanced Directives  Does Patient Have a Medical Advance Directive? Yes Yes Yes Yes Yes Yes Yes  Type of Estate agent of [place];Living will Healthcare Power of [place];Living will Healthcare Power of  [place];Living will Living will;Healthcare Power of [employer] Power of [place];Living will Living will Healthcare Power of [place];Living will  Does patient want to make changes to medical advance directive?  No - Patient declined   No - Patient declined No - Patient declined No - Patient declined   Copy of Healthcare Power of Attorney in Chart? No - copy requested Yes - validated most recent copy scanned in chart (See row information) Yes - validated most recent copy scanned in chart (See row information) No - copy requested No - copy requested  No - copy requested      Data saved with a previous flowsheet row definition    Current Medications (verified) Outpatient Encounter Medications as of 05/31/2024  Medication Sig   Continuous Glucose Sensor (FREESTYLE LIBRE 3 PLUS SENSOR) MISC Change sensor every 15 days. DX: E11.65   dapagliflozin  propanediol (FARXIGA ) 10 MG TABS tablet Take 1 tablet (10 mg total) by mouth daily before breakfast.   levothyroxine  (SYNTHROID ) 50 MCG tablet Take 1 tablet by mouth once daily   pantoprazole  (PROTONIX ) 40 MG tablet Take 1 tablet (40 mg total) by mouth daily.   rosuvastatin  (CRESTOR ) 20 MG tablet Take 1 tablet (20 mg total) by mouth daily.   Semaglutide , 1 MG/DOSE, (OZEMPIC , 1 MG/DOSE,) 4 MG/3ML SOPN Inject 1 mg into the skin once a week.   No facility-administered encounter medications on file as of 05/31/2024.    Allergies (verified) Sulfa antibiotics   History: Past Medical History:  Diagnosis Date   Allergy    Diabetes mellitus without complication (HCC)  GERD (gastroesophageal reflux disease)    Hyperlipidemia    Hypertension    Thyroid  disease 2023   hypothyroidism   Past Surgical History:  Procedure Laterality Date   ANTERIOR LATERAL LUMBAR FUSION WITH PERCUTANEOUS SCREW 1 LEVEL N/A 06/17/2021   Procedure: ANTERIOR LATERAL LUMBAR FUSION WITH PERCUTANEOUS SCREW 1 LEVEL (XLIF with PSFI) L3-4;  Surgeon: Burnetta Aures, MD;   Location: [facility] OR;  Service: Orthopedics;  Laterality: N/A;  5 hrs Left tap block with exparel  3C bed post op   COLONOSCOPY     dental surgeries     teeth extractions, root cannal   LUMBAR LAMINECTOMY/DECOMPRESSION MICRODISCECTOMY Left 01/30/2016   Procedure: LUMBAR LAMINECTOMY/CENTRAL DECOMPRESSION MICRODISCECTOMY L3 - L4 ON THE LEFT;  Surgeon: Tanda Heading, MD;  Location: [facility];  Service: Orthopedics;  Laterality: Left;   Family History  Problem Relation Age of Onset   Heart disease Mother    Stroke Father    Heart disease Father    Multiple sclerosis Sister    Cancer Sister    Ulcerative colitis Son    Colon cancer Neg Hx    Rectal cancer Neg Hx    Esophageal cancer Neg Hx    Stomach cancer Neg Hx    Pancreatic cancer Neg Hx    Social History   Socioeconomic History   Marital status: Married    Spouse name: Arland   Number of children: 3   Years of education: 14   Highest education level: Associate degree: occupational, Scientist, product/process development, or vocational program  Occupational History   Occupation: Retired     Comment: Games developer  Tobacco Use   Smoking status: Never   Smokeless tobacco: Never  Vaping Use   Vaping status: Never Used  Substance and Sexual Activity   Alcohol use: No   Drug use: No   Sexual activity: Not on file  Other Topics Concern   Not on file  Social History Narrative   Independent, going to gym with wife 5 days per week, very active, spends lots of time with family and some friends   Social Drivers of Corporate investment banker Strain: Low Risk  (05/31/2024)   Overall Financial Resource Strain (CARDIA)    Difficulty of Paying Living Expenses: Not hard at all  Food Insecurity: Food Insecurity Present (05/31/2024)   Hunger Vital Sign    Worried About Running Out of Food in the Last Year: Sometimes true    Ran Out of Food in the Last Year: Sometimes true  Transportation Needs: No Transportation Needs (05/31/2024)   PRAPARE - Therapist, art (Medical): No    Lack of Transportation (Non-Medical): No  Physical Activity: Sufficiently Active (05/31/2024)   Exercise Vital Sign    Days of Exercise per Week: 5 days    Minutes of Exercise per Session: 150+ min  Stress: No Stress Concern Present (05/31/2024)   [employer] of Occupational Health - Occupational Stress Questionnaire    Feeling of Stress: Not at all  Social Connections: Socially Integrated (05/31/2024)   Social Connection and Isolation Panel    Frequency of Communication with Friends and Family: More than three times a week    Frequency of Social Gatherings with Friends and Family: More than three times a week    Attends Religious Services: More than 4 times per year    Active Member of [employer] or Organizations: Yes    Attends Banker Meetings: More than 4 times per year  Marital Status: Married    Tobacco Counseling Counseling given: Yes    Clinical Intake:  Pre-visit preparation completed: Yes  Pain : No/denies pain     BMI - recorded: 22.5 Nutritional Status: BMI of 19-24  Normal Nutritional Risks: None Diabetes: Yes  Lab Results  Component Value Date   HGBA1C 7.3 (H) 05/21/2024   HGBA1C 7.1 (H) 02/16/2024   HGBA1C 7.0 (H) 09/22/2023     How often do you need to have someone help you when you read instructions, pamphlets, or other written materials from your doctor or pharmacy?: 1 - Never  Interpreter Needed?: No  Comments: awv assist w/pt's wife,Donna Information entered by :: Alia t/cma   Activities of Daily Living     05/31/2024   10:19 AM  In your present state of health, do you have any difficulty performing the following activities:  Hearing? 1  Vision? 0  Difficulty concentrating or making decisions? 0  Walking or climbing stairs? 0  Dressing or bathing? 0  Doing errands, shopping? 0  Preparing Food and eating ? N  Using the Toilet? N  In the past six months, have you accidently leaked  urine? Y  Do you have problems with loss of bowel control? Y  Managing your Medications? N  Managing your Finances? N  Housekeeping or managing your Housekeeping? N    Patient Care Team: Zollie Lowers, MD as PCP - General (Family Medicine) Billee Mliss BIR[facility], [facility] (Pharmacist)  I have updated your Care Teams any recent Medical Services you may have received from other providers in the past year.     Assessment:   This is a routine wellness examination for [employer].  Hearing/Vision screen Hearing Screening - Comments:: Pt has hearing issues Vision Screening - Comments:: Pt wear glasses-denies vision dif/pt goes to [facility] [address], [place], [place]/last appt was in 7/24 upcoming apt in 7/25   Goals Addressed             This Visit's Progress    Patient Stated   On track    05/28/2022 AWV Goal: Exercise for General Health  Patient will verbalize understanding of the benefits of increased physical activity: Exercising regularly is important. It will improve your overall fitness, flexibility, and endurance. Regular exercise also will improve your overall health. It can help you control your weight, reduce stress, and improve your bone density. Over the next year, patient will increase physical activity as tolerated with a goal of at least 150 minutes of moderate physical activity per week.  You can tell that you are exercising at a moderate intensity if your heart starts beating faster and you start breathing faster but can still hold a conversation. Moderate-intensity exercise ideas include: Walking 1 mile (1.6 km) in about 15 minutes Biking Hiking Golfing Dancing Water aerobics Patient will verbalize understanding of everyday activities that increase physical activity by providing examples like the following: Yard work, such as: Insurance underwriter Gardening Washing windows or floors Patient will be  able to explain general safety guidelines for exercising:  Before you start a new exercise program, talk with your health care provider. Do not exercise so much that you hurt yourself, feel dizzy, or get very short of breath. Wear comfortable clothes and wear shoes with good support. Drink plenty of water while you exercise to prevent dehydration or heat stroke. Work out until your breathing and your heartbeat get faster.  Depression Screen     05/31/2024   10:32 AM 05/21/2024   10:26 AM 02/16/2024    9:23 AM 09/22/2023    8:15 AM 06/15/2023    8:32 AM 05/31/2023    1:28 PM 03/15/2023    8:13 AM  PHQ 2/9 Scores  PHQ - 2 Score 0 0 0 0 0 0 0  PHQ- 9 Score 0 0 0        Fall Risk     05/31/2024   10:03 AM 04/10/2024    1:35 PM 09/22/2023    8:15 AM 06/15/2023    8:34 AM 06/15/2023    8:32 AM  Fall Risk   Falls in the past year? 0 0 0 0 0  Number falls in past yr: 0      Injury with Fall? 0      Risk for fall due to : No Fall Risks No Fall Risks     Follow up Falls evaluation completed Falls evaluation completed       MEDICARE RISK AT HOME:  Medicare Risk at Home Any stairs in or around the home?: No If so, are there any without handrails?: No Home free of loose throw rugs in walkways, pet beds, electrical cords, etc?: Yes Adequate lighting in your home to reduce risk of falls?: Yes Life alert?: No Use of a cane, walker or w/c?: No Grab bars in the bathroom?: Yes Shower chair or bench in shower?: Yes Elevated toilet seat or a handicapped toilet?: Yes  TIMED UP AND GO:  Was the test performed?  No  Cognitive Function: 6CIT completed        05/31/2024   10:33 AM 05/31/2023    1:29 PM 05/28/2022    1:24 PM 05/25/2021    9:06 AM 05/22/2020    8:45 AM  6CIT Screen  What Year? 0 points 0 points 0 points 0 points 0 points  What month? 0 points 0 points 0 points 0 points 0 points  What time? 0 points 0 points 0 points 0 points 0 points  Count back from 20 0 points 0  points 0 points 0 points 0 points  Months in reverse 0 points 0 points 4 points 0 points 0 points  Repeat phrase 0 points 0 points 0 points 0 points 4 points  Total Score 0 points 0 points 4 points 0 points 4 points    Immunizations Immunization History  Administered Date(s) Administered   Influenza, High Dose Seasonal PF 11/01/2018   Moderna Sars-Covid-2 Vaccination 12/27/2019, 02/01/2020   Pneumococcal Conjugate-13 07/03/2014   Pneumococcal Polysaccharide-23 03/03/2018   Tdap 03/03/2022   Zoster Recombinant(Shingrix) 03/03/2022, 05/31/2022    Screening Tests Health Maintenance  Topic Date Due   Diabetic kidney evaluation - Urine ACR  03/14/2024   FOOT EXAM  03/14/2024   COVID-19 Vaccine (3 - 2024-25 season) 06/06/2024 (Originally 08/07/2023)   INFLUENZA VACCINE  07/06/2024   HEMOGLOBIN A1C  11/20/2024   OPHTHALMOLOGY EXAM  12/05/2024   Diabetic kidney evaluation - eGFR measurement  05/21/2025   Medicare Annual Wellness (AWV)  05/31/2025   DTaP/Tdap/Td (2 - Td or Tdap) 03/03/2032   Pneumococcal Vaccine: 50+ Years  Completed   Hepatitis C Screening  Completed   Zoster Vaccines- Shingrix  Completed   Hepatitis B Vaccines  Aged Out   HPV VACCINES  Aged Out   Meningococcal B Vaccine  Aged Out   Colonoscopy  Discontinued    Health Maintenance  Health Maintenance Due  Topic Date  Due   Diabetic kidney evaluation - Urine ACR  03/14/2024   FOOT EXAM  03/14/2024   Health Maintenance Items Addressed: See Nurse Notes at the end of this note  Additional Screening:  Vision Screening: Recommended annual ophthalmology exams for early detection of glaucoma and other disorders of the eye. Would you like a referral to an eye doctor? No    Dental Screening: Recommended annual dental exams for proper oral hygiene  Community Resource Referral / Chronic Care Management: CRR required this visit?  No   CCM required this visit?  No   Plan:    I have personally reviewed and noted  the following in the patient's chart:   Medical and social history Use of alcohol, tobacco or illicit drugs  Current medications and supplements including opioid prescriptions. Patient is not currently taking opioid prescriptions. Functional ability and status Nutritional status Physical activity Advanced directives List of other physicians Hospitalizations, surgeries, and ER visits in previous 12 months Vitals Screenings to include cognitive, depression, and falls Referrals and appointments  In addition, I have reviewed and discussed with patient certain preventive protocols, quality metrics, and best practice recommendations. A written personalized care plan for preventive services as well as general preventive health recommendations were provided to patient.   Billy Nolan, CMA   05/31/2024   After Visit Summary: (MyChart) Due to this being a telephonic visit, the after visit summary with patients personalized plan was offered to patient via MyChart   Notes: Pt is aware and is due the following: Foot exam, [name] will get at next office visit.

## 2024-05-31 NOTE — Patient Instructions (Signed)
 Mr. Billy Nolan , Thank you for taking time out of your busy schedule to complete your Annual Wellness Visit with me. I enjoyed our conversation and look forward to speaking with you again next year. I, as well as your care team,  appreciate your ongoing commitment to your health goals. Please review the following plan we discussed and let me know if I can assist you in the future. Your Game plan/ To Do List    Follow up Visits: Next Medicare AWV with our clinical staff: 06/03/25 at 9:20a.m.   Have you seen your provider in the last 6 months (3 months if uncontrolled diabetes)? Yes Next Office Visit with your provider: 08/23/24 at 10:10a.m.  Clinician Recommendations:  Aim for 30 minutes of exercise or brisk walking, 6-8 glasses of water, and 5 servings of fruits and vegetables each day. Pt is aware and is due the following: Foot exam, [name] will get at next office visit.       This is a list of the screening recommended for you and due dates:  Health Maintenance  Topic Date Due   Yearly kidney health urinalysis for diabetes  03/14/2024   Complete foot exam   03/14/2024   COVID-19 Vaccine (3 - 2024-25 season) 06/06/2024*   Flu Shot  07/06/2024   Hemoglobin A1C  11/20/2024   Eye exam for diabetics  12/05/2024   Yearly kidney function blood test for diabetes  05/21/2025   Medicare Annual Wellness Visit  05/31/2025   DTaP/Tdap/Td vaccine (2 - Td or Tdap) 03/03/2032   Pneumococcal Vaccine for age over 73  Completed   Hepatitis C Screening  Completed   Zoster (Shingles) Vaccine  Completed   Hepatitis B Vaccine  Aged Out   HPV Vaccine  Aged Out   Meningitis B Vaccine  Aged Out   Colon Cancer Screening  Discontinued  *Topic was postponed. The date shown is not the original due date.    Advanced directives: (In Chart) A copy of your advanced directives are scanned into your chart should your provider ever need it. Advance Care Planning is important because it:  [x]  Makes sure you receive the  medical care that is consistent with your values, goals, and preferences  [x]  It provides guidance to your family and loved ones and reduces their decisional burden about whether or not they are making the right decisions based on your wishes.  Follow the link provided in your after visit summary or read over the paperwork we have mailed to you to help you started getting your Advance Directives in place. If you need assistance in completing these, please reach out to us  so that we can help you!  See attachments for Preventive Care and Fall Prevention Tips.

## 2024-06-04 ENCOUNTER — Telehealth: Payer: Self-pay | Admitting: Family Medicine

## 2024-06-04 NOTE — Telephone Encounter (Signed)
 Copied from CRM [id number]. Topic: Referral - Question >> Jun 04, 2024  3:32 PM Santiya F wrote: Reason for CRM: Stacy with [facility] is calling in because she needs the referral re-faxed to their office. Fax:[phone]

## 2024-06-04 NOTE — Telephone Encounter (Signed)
 Faxed referral

## 2024-06-12 ENCOUNTER — Other Ambulatory Visit: Payer: Self-pay | Admitting: Family Medicine

## 2024-06-12 DIAGNOSIS — E1141 Type 2 diabetes mellitus with diabetic mononeuropathy: Secondary | ICD-10-CM

## 2024-06-20 ENCOUNTER — Telehealth: Payer: Self-pay

## 2024-06-20 ENCOUNTER — Encounter: Payer: Self-pay | Admitting: Family Medicine

## 2024-06-20 NOTE — Telephone Encounter (Signed)
 Received a refill request fax from [employer] patient assistance company for patients FARXIGA  medication.   Please send a 90 day supply (with refills) to [facility] if applicable. Thanks!

## 2024-06-21 ENCOUNTER — Other Ambulatory Visit: Payer: Self-pay | Admitting: Family Medicine

## 2024-06-21 DIAGNOSIS — E1141 Type 2 diabetes mellitus with diabetic mononeuropathy: Secondary | ICD-10-CM

## 2024-06-21 MED ORDER — DAPAGLIFLOZIN PROPANEDIOL 10 MG PO TABS: 10.0000 mg | ORAL_TABLET | Freq: Every day | ORAL | 5 refills | Status: AC

## 2024-06-21 NOTE — Telephone Encounter (Signed)
 Please let the patient know that I sent their prescription to their pharmacy. Thanks, WS

## 2024-07-03 NOTE — Telephone Encounter (Signed)
 Copied from CRM [id number]. Topic: Referral - Status >> Jun 12, 2024 12:14 PM Powell HERO wrote: Reason for CRM: Referral was received but it was just the order. [facility] is requesting recent office notes, med list, imaging, labs/tests, insurance information, and records from where she was previously seen at [facility] if they are available. >> Jun 12, 2024 12:56 PM Charmaine B wrote: Proper Referral Clinical information as been faxed to [facility] as Patient requested.  Please send Referrals a message before faxing Referral information - The Office's require a list of Clinical Documentation and we have a proper process in place that ensures the Office receives the information needed and the Patient is properly made aware of Referral information. Sending improper information causes confusion between the receiving office and the Patient and could delay Patient care.

## 2024-07-04 NOTE — Telephone Encounter (Signed)
 Verified through Referral Fax -36 Pages were sent to [facility] including [place], [employer], etc.

## 2024-08-09 ENCOUNTER — Telehealth: Payer: Self-pay

## 2024-08-09 NOTE — Telephone Encounter (Addendum)
 In process of completing [name] Nordisk refills for patients OZEMPIC  1MG  medication.  Application faxed to [employer]. for signature.

## 2024-08-10 DIAGNOSIS — Z8719 Personal history of other diseases of the digestive system: Secondary | ICD-10-CM | POA: Diagnosis not present

## 2024-08-10 DIAGNOSIS — D582 Other hemoglobinopathies: Secondary | ICD-10-CM | POA: Diagnosis not present

## 2024-08-10 DIAGNOSIS — K222 Esophageal obstruction: Secondary | ICD-10-CM | POA: Diagnosis not present

## 2024-08-10 DIAGNOSIS — R17 Unspecified jaundice: Secondary | ICD-10-CM | POA: Diagnosis not present

## 2024-08-10 DIAGNOSIS — Z1211 Encounter for screening for malignant neoplasm of colon: Secondary | ICD-10-CM | POA: Diagnosis not present

## 2024-08-17 ENCOUNTER — Telehealth: Payer: Self-pay | Admitting: Pharmacist

## 2024-08-17 NOTE — Telephone Encounter (Signed)
 Patient stable on Ozempic  1mg  weekly Refills completed and signed for [name] nordisk PAP Reviewed profile Updated active fyi

## 2024-08-20 LAB — HM DIABETES EYE EXAM

## 2024-08-20 NOTE — Telephone Encounter (Signed)
 Refills faxed to novo nordisk.

## 2024-08-23 ENCOUNTER — Encounter: Payer: Self-pay | Admitting: Family Medicine

## 2024-08-23 ENCOUNTER — Ambulatory Visit: Admitting: Family Medicine

## 2024-08-23 DIAGNOSIS — Z125 Encounter for screening for malignant neoplasm of prostate: Secondary | ICD-10-CM | POA: Diagnosis not present

## 2024-08-23 DIAGNOSIS — E785 Hyperlipidemia, unspecified: Secondary | ICD-10-CM | POA: Diagnosis not present

## 2024-08-23 DIAGNOSIS — E1141 Type 2 diabetes mellitus with diabetic mononeuropathy: Secondary | ICD-10-CM

## 2024-08-23 LAB — BAYER DCA HB A1C WAIVED: HB A1C (BAYER DCA - WAIVED): 7 % — ABNORMAL HIGH (ref 4.8–5.6)

## 2024-08-23 LAB — LIPID PANEL

## 2024-08-23 MED ORDER — SEMAGLUTIDE (2 MG/DOSE) 8 MG/3ML ~~LOC~~ SOPN: 2.0000 mg | PEN_INJECTOR | SUBCUTANEOUS | 3 refills | Status: DC

## 2024-08-23 MED ORDER — COLESTIPOL HCL 5 G PO GRAN: 5.0000 g | GRANULES | Freq: Two times a day (BID) | ORAL | 12 refills | Status: AC

## 2024-08-23 NOTE — Progress Notes (Signed)
 Subjective:  Patient ID: Billy Nolan, male    DOB: Oct 28, 1946  Age: 78 y.o. MRN: [id number]  CC: Medical Management of Chronic Issues   HPI  Discussed the use of AI scribe software for clinical note transcription with the patient, who gave verbal consent to proceed.  History of Present Illness Billy Nolan is a 78 year old male with diabetes who presents for follow-up of blood sugar management.  His recent A1c is 7.0. He uses the [place] 3 continuous glucose monitor, with an average blood sugar of 136 mg/dL, mostly over 899 mg/dL and rarely over 849 mg/dL. Morning blood sugar averages 126 mg/dL. Postprandial readings are higher, with spikes reaching 200 mg/dL after meals. He is on semaglutide  injections weekly and Farxiga . No significant hypoglycemic episodes, weakness, or shakiness. He also takes rosuvastatin  for cholesterol management.  He experiences diarrhea three days a week, starting after his semaglutide  injection on Sunday and lasting until Wednesday. The diarrhea is severe and watery, occurring multiple times a day and sometimes at night. Occasional vomiting occurs, attributed by his caregiver to heartburn, though he denies frequent heartburn. He takes pantoprazole  as needed for reflux symptoms. He is scheduled for a colonoscopy next month to investigate the cause of his diarrhea. He has a history of gallstones but no current symptoms warranting gallbladder removal.  No recent chest pain, shortness of breath, or other serious conditions. He maintains an active lifestyle, including yard work, and feels generally well.          08/23/2024   10:16 AM 05/31/2024   10:32 AM 05/21/2024   10:26 AM  Depression screen PHQ 2/9  Decreased Interest 0 0 0  Down, Depressed, Hopeless 0 0 0  PHQ - 2 Score 0 0 0  Altered sleeping  0 0  Tired, decreased energy  2 0  Change in appetite  0 0  Feeling bad or failure about yourself   0 0  Trouble concentrating  0 0  Moving slowly or  fidgety/restless  0 0  Suicidal thoughts  0 0  PHQ-9 Score  2 0  Difficult doing work/chores  Not difficult at all Not difficult at all   `c  History Billy Nolan has a past medical history of Allergy, Diabetes mellitus without complication (HCC), GERD (gastroesophageal reflux disease), Hyperlipidemia, Hypertension, and Thyroid  disease (2023).   He has a past surgical history that includes dental surgeries; Lumbar laminectomy/decompression microdiscectomy (Left, 01/30/2016); Anterior lateral lumbar fusion with percutaneous screw 1 level (N/A, 06/17/2021); and Colonoscopy.   His family history includes Cancer in his sister; Heart disease in his father and mother; Multiple sclerosis in his sister; Stroke in his father; Ulcerative colitis in his son.He reports that he has never smoked. He has never used smokeless tobacco. He reports that he does not drink alcohol and does not use drugs.    ROS Review of Systems  Constitutional: Negative.   HENT: Negative.    Eyes:  Negative for visual disturbance.  Respiratory:  Negative for cough and shortness of breath.   Cardiovascular:  Negative for chest pain and leg swelling.  Gastrointestinal:  Negative for abdominal pain, diarrhea, nausea and vomiting.  Genitourinary:  Negative for difficulty urinating.  Musculoskeletal:  Negative for arthralgias and myalgias.  Skin:  Negative for rash.  Neurological:  Negative for headaches.  Psychiatric/Behavioral:  Negative for sleep disturbance.     Objective:  BP 125/74   Pulse 79   Temp 98 F (36.7 C)   Ht 5' 8 (1.727  m)   Wt 149 lb (67.6 kg)   SpO2 96%   BMI 22.66 kg/m   BP Readings from Last 3 Encounters:  08/23/24 125/74  05/31/24 112/68  05/21/24 112/68    Wt Readings from Last 3 Encounters:  08/23/24 149 lb (67.6 kg)  05/31/24 148 lb (67.1 kg)  05/21/24 148 lb (67.1 kg)     Physical Exam Vitals reviewed.  Constitutional:      Appearance: He is well-developed.  HENT:     Head:  Normocephalic and atraumatic.     Right Ear: External ear normal.     Left Ear: External ear normal.     Mouth/Throat:     Pharynx: No oropharyngeal exudate or posterior oropharyngeal erythema.  Eyes:     Pupils: Pupils are equal, round, and reactive to light.  Cardiovascular:     Rate and Rhythm: Normal rate and regular rhythm.     Heart sounds: No murmur heard. Pulmonary:     Effort: No respiratory distress.     Breath sounds: Normal breath sounds.  Musculoskeletal:     Cervical back: Normal range of motion and neck supple.  Neurological:     Mental Status: He is alert and oriented to person, place, and time.      Assessment & Plan:  Type 2 diabetes mellitus with diabetic mononeuropathy, without long-term current use of insulin  (HCC) -     Microalbumin / creatinine urine ratio -     CMP14+EGFR -     CBC with Differential/Platelet -     Bayer DCA Hb A1c Waived  Hyperlipidemia, unspecified hyperlipidemia type -     Lipid panel  Prostate cancer screening -     PSA, total and free  Other orders -     Semaglutide  (2 MG/DOSE); Inject 2 mg as directed once a week.  Dispense: 9 mL; Refill: 3 -     Colestipol  HCl; Take 5 g by mouth 2 (two) times daily.  Dispense: 500 g; Refill: 12    Assessment and Plan Assessment & Plan Type 2 diabetes mellitus   Type 2 diabetes mellitus is managed with an A1c of 7.0, which is at goal but could be improved to reduce cardiovascular risk. Blood sugar levels are generally well-controlled with occasional postprandial spikes up to 200 mg/dL. Current treatment includes semaglutide  and Farxiga . There is a risk of heart attack and stroke even at an A1c of 7.0, and reducing A1c further could lower these risks. Increase semaglutide  dose after confirming availability through pharmacy. Continue Farxiga  as prescribed. Encourage dietary modifications to reduce carbohydrate intake, especially at supper. Monitor blood sugar levels with a continuous glucose  monitor. Send prescription to [facility] in [facility] for semaglutide  dose adjustment.  Diarrhea likely secondary to GLP-1 agonist therapy   Diarrhea occurs approximately three days per week, likely due to semaglutide  (GLP-1 agonist) therapy. Symptoms include severe watery diarrhea, sometimes occurring all night, typically starting after the semaglutide  injection and lasting until Wednesday. Prescribe colestipol  to manage diarrhea, starting with once daily dosing and increasing to twice daily if needed. Monitor for worsening of diarrhea symptoms with increased semaglutide  dose. Consider switching to Mounjaro if diarrhea worsens.  Hyperlipidemia   Hyperlipidemia is managed with rosuvastatin . Diet and exercise alone have limited impact on cholesterol levels. Continue rosuvastatin  as prescribed. Encourage regular exercise, including weightlifting, to improve lipid profile.  General Health Maintenance   Discussed upcoming colonoscopy to investigate potential causes of diarrhea and rule out other gastrointestinal issues. No current  issues with reflux or heartburn, but occasional episodes are managed with pantoprazole . Proceed with scheduled colonoscopy in mid-October. Continue pantoprazole  as needed for reflux symptoms.  Follow-up   Plan for follow-up in three months unless symptoms change or worsen. Schedule follow-up appointment in three months. Review family member's lipid profile at next appointment.       Follow-up: Return in about 3 months (around 11/22/2024).  Butler Der, M.D.

## 2024-08-24 ENCOUNTER — Telehealth: Payer: Self-pay | Admitting: Family Medicine

## 2024-08-24 LAB — CMP14+EGFR
ALT: 23 IU/L (ref 0–44)
AST: 19 IU/L (ref 0–40)
Albumin: 4.7 g/dL (ref 3.8–4.8)
Alkaline Phosphatase: 62 IU/L (ref 47–123)
BUN/Creatinine Ratio: 9 — AB (ref 10–24)
BUN: 8 mg/dL (ref 8–27)
Bilirubin Total: 2.8 mg/dL — AB (ref 0.0–1.2)
CO2: 23 mmol/L (ref 20–29)
Calcium: 9.6 mg/dL (ref 8.6–10.2)
Chloride: 103 mmol/L (ref 96–106)
Creatinine, Ser: 0.87 mg/dL (ref 0.76–1.27)
Globulin, Total: 1.8 g/dL (ref 1.5–4.5)
Glucose: 137 mg/dL — AB (ref 70–99)
Potassium: 4.6 mmol/L (ref 3.5–5.2)
Sodium: 142 mmol/L (ref 134–144)
Total Protein: 6.5 g/dL (ref 6.0–8.5)
eGFR: 89 mL/min/1.73 (ref >59)

## 2024-08-24 LAB — CBC WITH DIFFERENTIAL/PLATELET
Basophils Absolute: 0 x10E3/uL (ref 0.0–0.2)
Basos: 1 %
EOS (ABSOLUTE): 0.2 x10E3/uL (ref 0.0–0.4)
Eos: 4 %
Hematocrit: 52.7 % — ABNORMAL HIGH (ref 37.5–51.0)
Hemoglobin: 17.4 g/dL (ref 13.0–17.7)
Immature Grans (Abs): 0 x10E3/uL (ref 0.0–0.1)
Immature Granulocytes: 0 %
Lymphocytes Absolute: 1.8 x10E3/uL (ref 0.7–3.1)
Lymphs: 30 %
MCH: 31.4 pg (ref 26.6–33.0)
MCHC: 33 g/dL (ref 31.5–35.7)
MCV: 95 fL (ref 79–97)
Monocytes Absolute: 0.4 x10E3/uL (ref 0.1–0.9)
Monocytes: 7 %
Neutrophils Absolute: 3.3 x10E3/uL (ref 1.4–7.0)
Neutrophils: 58 %
Platelets: 197 x10E3/uL (ref 150–450)
RBC: 5.55 x10E6/uL (ref 4.14–5.80)
RDW: 13.4 % (ref 11.6–15.4)
WBC: 5.8 x10E3/uL (ref 3.4–10.8)

## 2024-08-24 LAB — LIPID PANEL
Cholesterol, Total: 133 mg/dL (ref 100–199)
HDL: 40 mg/dL (ref >39)
LDL CALC COMMENT:: 3.3 ratio (ref 0.0–5.0)
LDL Chol Calc (NIH): 68 mg/dL (ref 0–99)
Triglycerides: 146 mg/dL (ref 0–149)
VLDL Cholesterol Cal: 25 mg/dL (ref 5–40)

## 2024-08-24 LAB — PSA, TOTAL AND FREE
PSA, Free Pct: 20 %
PSA, Free: 0.12 ng/mL
Prostate Specific Ag, Serum: 0.6 ng/mL (ref 0.0–4.0)

## 2024-08-24 LAB — MICROALBUMIN / CREATININE URINE RATIO
Creatinine, Urine: 90.5 mg/dL
Microalb/Creat Ratio: 8 mg/g{creat} (ref 0–29)
Microalbumin, Urine: 7.1 ug/mL

## 2024-08-24 NOTE — Telephone Encounter (Signed)
 Patient AWARE TO PICK UP PATIENT ASSISTANCE

## 2024-08-31 ENCOUNTER — Encounter: Payer: Self-pay | Admitting: Family Medicine

## 2024-08-31 ENCOUNTER — Ambulatory Visit: Payer: Self-pay | Admitting: Family Medicine

## 2024-08-31 NOTE — Progress Notes (Signed)
Hello [name],  Your lab result is normal and/or stable.Some minor variations that are not significant are commonly marked abnormal, but do not represent any medical problem for you.  Best regards, Mechele Claude, M.D.

## 2024-09-13 ENCOUNTER — Telehealth: Payer: Self-pay

## 2024-09-13 NOTE — Telephone Encounter (Signed)
   For Farxiga  re-enrollment _________________________________   Medicare patients will no longer be eligible for Ozempic .

## 2024-09-24 DIAGNOSIS — K222 Esophageal obstruction: Secondary | ICD-10-CM | POA: Diagnosis not present

## 2024-09-24 DIAGNOSIS — K5732 Diverticulitis of large intestine without perforation or abscess without bleeding: Secondary | ICD-10-CM | POA: Diagnosis not present

## 2024-09-24 DIAGNOSIS — D123 Benign neoplasm of transverse colon: Secondary | ICD-10-CM | POA: Diagnosis not present

## 2024-09-24 DIAGNOSIS — K5289 Other specified noninfective gastroenteritis and colitis: Secondary | ICD-10-CM | POA: Diagnosis not present

## 2024-09-24 DIAGNOSIS — K2961 Other gastritis with bleeding: Secondary | ICD-10-CM | POA: Diagnosis not present

## 2024-09-24 DIAGNOSIS — K2930 Chronic superficial gastritis without bleeding: Secondary | ICD-10-CM | POA: Diagnosis not present

## 2024-09-24 DIAGNOSIS — K2100 Gastro-esophageal reflux disease with esophagitis, without bleeding: Secondary | ICD-10-CM | POA: Diagnosis not present

## 2024-09-24 DIAGNOSIS — D128 Benign neoplasm of rectum: Secondary | ICD-10-CM | POA: Diagnosis not present

## 2024-09-24 DIAGNOSIS — Z1211 Encounter for screening for malignant neoplasm of colon: Secondary | ICD-10-CM | POA: Diagnosis not present

## 2024-09-24 DIAGNOSIS — K648 Other hemorrhoids: Secondary | ICD-10-CM | POA: Diagnosis not present

## 2024-09-24 DIAGNOSIS — K200 Eosinophilic esophagitis: Secondary | ICD-10-CM | POA: Diagnosis not present

## 2024-09-24 LAB — HM COLONOSCOPY

## 2024-09-25 NOTE — Telephone Encounter (Signed)
 Spoke with both the patient and his wife.  He will call [employer] to confirm his 2026 re-enrollment.

## 2024-10-01 DIAGNOSIS — D123 Benign neoplasm of transverse colon: Secondary | ICD-10-CM | POA: Diagnosis not present

## 2024-10-01 DIAGNOSIS — D128 Benign neoplasm of rectum: Secondary | ICD-10-CM | POA: Diagnosis not present

## 2024-10-01 DIAGNOSIS — K5289 Other specified noninfective gastroenteritis and colitis: Secondary | ICD-10-CM | POA: Diagnosis not present

## 2024-10-01 DIAGNOSIS — K2930 Chronic superficial gastritis without bleeding: Secondary | ICD-10-CM | POA: Diagnosis not present

## 2024-10-01 DIAGNOSIS — K200 Eosinophilic esophagitis: Secondary | ICD-10-CM | POA: Diagnosis not present

## 2024-10-13 ENCOUNTER — Other Ambulatory Visit: Payer: Self-pay | Admitting: Family Medicine

## 2024-11-10 ENCOUNTER — Other Ambulatory Visit: Payer: Self-pay | Admitting: Family Medicine

## 2024-11-22 ENCOUNTER — Encounter: Payer: Self-pay | Admitting: Family Medicine

## 2024-11-22 ENCOUNTER — Ambulatory Visit (INDEPENDENT_AMBULATORY_CARE_PROVIDER_SITE_OTHER): Payer: Self-pay | Admitting: Family Medicine

## 2024-11-22 VITALS — BP 118/73 | HR 79 | Temp 97.6°F | Ht 68.0 in | Wt 150.0 lb

## 2024-11-22 DIAGNOSIS — E1141 Type 2 diabetes mellitus with diabetic mononeuropathy: Secondary | ICD-10-CM

## 2024-11-22 DIAGNOSIS — E785 Hyperlipidemia, unspecified: Secondary | ICD-10-CM

## 2024-11-22 DIAGNOSIS — Z7985 Long-term (current) use of injectable non-insulin antidiabetic drugs: Secondary | ICD-10-CM | POA: Diagnosis not present

## 2024-11-22 DIAGNOSIS — I10 Essential (primary) hypertension: Secondary | ICD-10-CM

## 2024-11-22 LAB — CBC WITH DIFFERENTIAL/PLATELET
Basophils Absolute: 0 x10E3/uL (ref 0.0–0.2)
Basos: 1 %
EOS (ABSOLUTE): 0.1 x10E3/uL (ref 0.0–0.4)
Eos: 3 %
Hematocrit: 50.9 % (ref 37.5–51.0)
Hemoglobin: 16.7 g/dL (ref 13.0–17.7)
Immature Grans (Abs): 0 x10E3/uL (ref 0.0–0.1)
Immature Granulocytes: 0 %
Lymphocytes Absolute: 1.6 x10E3/uL (ref 0.7–3.1)
Lymphs: 28 %
MCH: 31.2 pg (ref 26.6–33.0)
MCHC: 32.8 g/dL (ref 31.5–35.7)
MCV: 95 fL (ref 79–97)
Monocytes Absolute: 0.4 x10E3/uL (ref 0.1–0.9)
Monocytes: 6 %
Neutrophils Absolute: 3.6 x10E3/uL (ref 1.4–7.0)
Neutrophils: 62 %
Platelets: 194 x10E3/uL (ref 150–450)
RBC: 5.36 x10E6/uL (ref 4.14–5.80)
RDW: 12.8 % (ref 11.6–15.4)
WBC: 5.7 x10E3/uL (ref 3.4–10.8)

## 2024-11-22 LAB — COMPREHENSIVE METABOLIC PANEL WITH GFR
ALT: 28 IU/L (ref 0–44)
AST: 15 IU/L (ref 0–40)
Albumin: 4.5 g/dL (ref 3.8–4.8)
Alkaline Phosphatase: 64 IU/L (ref 47–123)
BUN/Creatinine Ratio: 14 (ref 10–24)
BUN: 12 mg/dL (ref 8–27)
Bilirubin Total: 2.4 mg/dL — ABNORMAL HIGH (ref 0.0–1.2)
CO2: 22 mmol/L (ref 20–29)
Calcium: 9.7 mg/dL (ref 8.6–10.2)
Chloride: 99 mmol/L (ref 96–106)
Creatinine, Ser: 0.83 mg/dL (ref 0.76–1.27)
Globulin, Total: 2 g/dL (ref 1.5–4.5)
Glucose: 125 mg/dL — ABNORMAL HIGH (ref 70–99)
Potassium: 4.3 mmol/L (ref 3.5–5.2)
Sodium: 139 mmol/L (ref 134–144)
Total Protein: 6.5 g/dL (ref 6.0–8.5)
eGFR: 91 mL/min/1.73 (ref >59)

## 2024-11-22 LAB — BAYER DCA HB A1C WAIVED: HB A1C (BAYER DCA - WAIVED): 6.7 % — ABNORMAL HIGH (ref 4.8–5.6)

## 2024-11-22 NOTE — Progress Notes (Unsigned)
 Subjective:  Patient ID: Billy Nolan, male    DOB: [birth year]  Age: 78 y.o. MRN: [id number]  CC: Medical Management of Chronic Issues (Patient assistance is ending at the end of this year for Ozempic . )   HPI  Discussed the use of AI scribe software for clinical note transcription with the patient, who gave verbal consent to proceed.  History of Present Illness Billy Nolan is a 78 year old male who presents with gastrointestinal side effects from Ozempic .  He experiences gastrointestinal side effects, specifically watery diarrhea, following his weekly Ozempic  injections. The diarrhea typically occurs 24 to 48 hours after the injection and has been a consistent issue with each dose of Ozempic .  He recalls that ondansetron  was prescribed in 2022 for nausea, but he did not continue it due to cost. He also mentions a previous prescription for Cholestyramine for diarrhea, which was also costly at $160 per month.  He is concerned about the financial burden of his medications, noting a $2,100 deductible for prescriptions under Medicare, which he finds unaffordable. He is currently using Ozempic  and has enough doses to last until February.          08/23/2024   10:16 AM 05/31/2024   10:32 AM 05/21/2024   10:26 AM  Depression screen PHQ 2/9  Decreased Interest 0 0 0  Down, Depressed, Hopeless 0 0 0  PHQ - 2 Score 0 0 0  Altered sleeping  0 0  Tired, decreased energy  2 0  Change in appetite  0 0  Feeling bad or failure about yourself   0 0  Trouble concentrating  0 0  Moving slowly or fidgety/restless  0 0  Suicidal thoughts  0 0  PHQ-9 Score  2  0   Difficult doing work/chores  Not difficult at all Not difficult at all     Data saved with a previous flowsheet row definition    History Billy Nolan has a past medical history of Allergy, Diabetes mellitus without complication (HCC), GERD (gastroesophageal reflux disease), Hyperlipidemia, Hypertension, and Thyroid  disease (2023).   He  has a past surgical history that includes dental surgeries; Lumbar laminectomy/decompression microdiscectomy (Left, 01/30/2016); Anterior lateral lumbar fusion with percutaneous screw 1 level (N/A, 06/17/2021); and Colonoscopy.   His family history includes Cancer in his sister; Heart disease in his father and mother; Multiple sclerosis in his sister; Stroke in his father; Ulcerative colitis in his son.He reports that he has never smoked. He has never used smokeless tobacco. He reports that he does not drink alcohol and does not use drugs.    ROS Review of Systems  Objective:  BP 118/73   Pulse 79   Temp 97.6 F (36.4 C)   Ht 5' 8 (1.727 m)   Wt 150 lb (68 kg)   SpO2 98%   BMI 22.81 kg/m   BP Readings from Last 3 Encounters:  11/22/24 118/73  08/23/24 125/74  05/31/24 112/68    Wt Readings from Last 3 Encounters:  11/22/24 150 lb (68 kg)  08/23/24 149 lb (67.6 kg)  05/31/24 148 lb (67.1 kg)     Physical Exam Physical Exam GENERAL: Alert, cooperative, well developed, no acute distress HEENT: Normocephalic, normal oropharynx, moist mucous membranes CHEST: Clear to auscultation bilaterally, No wheezes, rhonchi, or crackles CARDIOVASCULAR: Normal heart rate and rhythm, S1 and S2 normal without murmurs ABDOMEN: Soft, non-tender, non-distended, without organomegaly, Normal bowel sounds EXTREMITIES: No cyanosis or edema NEUROLOGICAL: Cranial nerves grossly intact, Moves all extremities without  gross motor or sensory deficit  Diabetic Foot Exam - Simple   Simple Foot Form Diabetic Foot exam was performed with the following findings: Yes 11/22/2024 10:44 AM  Visual Inspection No deformities, no ulcerations, no other skin breakdown bilaterally: Yes Sensation Testing Intact to touch and monofilament testing bilaterally: Yes Pulse Check Posterior Tibialis and Dorsalis pulse intact bilaterally: Yes Comments     Assessment & Plan:  Hyperlipidemia, unspecified  hyperlipidemia type -     Comprehensive metabolic panel with GFR  Type 2 diabetes mellitus with diabetic mononeuropathy, without long-term current use of insulin  (HCC) -     Bayer DCA Hb A1c Waived -     Microalbumin / creatinine urine ratio  Essential hypertension -     CBC with Differential/Platelet    Assessment and Plan Assessment & Plan Type 2 diabetes mellitus with diabetic mononeuropathy   He is currently on Ozempic  but experiencing gastrointestinal side effects, including diarrhea. A switch to Mounjaro is considered due to potentially fewer side effects and insurance preference. Mounjaro is equally or more effective and may be more affordable. He has been switched to Mounjaro, starting at the lowest dose. He should check with [place] about subsidies for [employer]. A follow-up is scheduled in six weeks to consider a dose increase.  Diarrhea secondary to GLP-1 agonist therapy   He experiences watery diarrhea 24 to 48 hours after Ozempic  injection, leading to muscle mass loss. Previous anti-nausea medication was costly and not covered by insurance. Mounjaro may reduce diarrhea compared to Ozempic . He has been switched to Mounjaro to potentially reduce diarrhea and should monitor for improvement in symptoms.       Follow-up: No follow-ups on file.  Butler Der, M.D.

## 2024-11-23 ENCOUNTER — Other Ambulatory Visit (HOSPITAL_COMMUNITY): Payer: Self-pay

## 2024-11-23 ENCOUNTER — Telehealth: Payer: Self-pay | Admitting: Pharmacy Technician

## 2024-11-23 LAB — MICROALBUMIN / CREATININE URINE RATIO
Creatinine, Urine: 76.3 mg/dL
Microalb/Creat Ratio: 7 mg/g{creat} (ref 0–29)
Microalbumin, Urine: 5.7 ug/mL

## 2024-11-23 NOTE — Telephone Encounter (Signed)
 Pharmacy Patient Advocate Encounter   Received notification from Onbase that prior authorization for Mounjaro 2.5MG /0.5ML auto-injectors is required/requested.   Insurance verification completed.   The patient is insured through [facility] ADVANTAGE/RX ADVANCE.   Per test claim: PA required and submitted KEY/EOC/Request #: [id number] from 11/23/24 to 11/23/25. Ran test claim, Copay is $47.00. This test claim was processed through [facility]- copay amounts may vary at other pharmacies due to pharmacy/plan contracts, or as the patient moves through the different stages of their insurance plan.

## 2024-11-23 NOTE — Telephone Encounter (Signed)
 Left detailed message making patient aware of PA approval.

## 2024-11-25 ENCOUNTER — Encounter: Payer: Self-pay | Admitting: Family Medicine

## 2024-11-27 ENCOUNTER — Ambulatory Visit: Payer: Self-pay | Admitting: Family Medicine

## 2024-11-27 NOTE — Progress Notes (Signed)
Hello Billy Nolan,  Your lab result is normal and/or stable.Some minor variations that are not significant are commonly marked abnormal, but do not represent any medical problem for you.  Best regards, Mechele Claude, M.D.

## 2025-01-01 ENCOUNTER — Encounter: Payer: Self-pay | Admitting: Family Medicine

## 2025-01-01 DIAGNOSIS — E1141 Type 2 diabetes mellitus with diabetic mononeuropathy: Secondary | ICD-10-CM

## 2025-01-09 ENCOUNTER — Telehealth: Payer: Self-pay

## 2025-01-09 NOTE — Progress Notes (Unsigned)
 Care Guide Pharmacy Note  01/09/2025 Name: Billy Nolan MRN: [id number] DOB: 1946/01/25  Referred By: Zollie Lowers, MD Reason for referral: Complex Care Management (Outreach to schedule with Pharm d )   Billy Nolan is a 79 y.o. year old male who is a primary care patient of Stacks, Lowers, MD.  Billy Nolan was referred to the pharmacist for assistance related to: DMII  An unsuccessful telephone outreach was attempted today to contact the patient who was referred to the pharmacy team for assistance with medication assistance. Additional attempts will be made to contact the patient.  Billy Nolan , RMA     [facility]  [facility], [facility] Guide  Direct Dial: [phone]  Website: [email or web address]

## 2025-02-21 ENCOUNTER — Ambulatory Visit: Admitting: Family Medicine

## 2025-06-03 ENCOUNTER — Ambulatory Visit: Payer: Self-pay
